# Patient Record
Sex: Female | Born: 1950 | Race: White | Hispanic: No | Marital: Married | Smoking: Never smoker
Health system: Southern US, Community
[De-identification: ages and names within clinical notes are randomized; demographics above are authoritative.]

## PROBLEM LIST (undated history)

## (undated) DIAGNOSIS — K219 Gastro-esophageal reflux disease without esophagitis: Secondary | ICD-10-CM

## (undated) DIAGNOSIS — T7840XA Allergy, unspecified, initial encounter: Secondary | ICD-10-CM

## (undated) DIAGNOSIS — A6000 Herpesviral infection of urogenital system, unspecified: Secondary | ICD-10-CM

## (undated) DIAGNOSIS — M810 Age-related osteoporosis without current pathological fracture: Secondary | ICD-10-CM

## (undated) DIAGNOSIS — F419 Anxiety disorder, unspecified: Secondary | ICD-10-CM

## (undated) DIAGNOSIS — F909 Attention-deficit hyperactivity disorder, unspecified type: Secondary | ICD-10-CM

## (undated) DIAGNOSIS — Z974 Presence of external hearing-aid: Secondary | ICD-10-CM

## (undated) DIAGNOSIS — I719 Aortic aneurysm of unspecified site, without rupture: Secondary | ICD-10-CM

## (undated) DIAGNOSIS — F32A Depression, unspecified: Secondary | ICD-10-CM

## (undated) DIAGNOSIS — J45909 Unspecified asthma, uncomplicated: Secondary | ICD-10-CM

## (undated) DIAGNOSIS — E785 Hyperlipidemia, unspecified: Secondary | ICD-10-CM

## (undated) DIAGNOSIS — E039 Hypothyroidism, unspecified: Secondary | ICD-10-CM

## (undated) DIAGNOSIS — I712 Thoracic aortic aneurysm, without rupture, unspecified: Secondary | ICD-10-CM

## (undated) HISTORY — DX: Anxiety disorder, unspecified: F41.9

## (undated) HISTORY — DX: Gastro-esophageal reflux disease without esophagitis: K21.9

## (undated) HISTORY — DX: Attention-deficit hyperactivity disorder, unspecified type: F90.9

## (undated) HISTORY — DX: Depression, unspecified: F32.A

## (undated) HISTORY — DX: Thoracic aortic aneurysm, without rupture, unspecified: I71.20

## (undated) HISTORY — PX: TONSILLECTOMY AND ADENOIDECTOMY: SUR1326

## (undated) HISTORY — PX: ROBOTIC ASSISTED TOTAL HYSTERECTOMY WITH BILATERAL SALPINGO OOPHERECTOMY: SHX6086

## (undated) HISTORY — DX: Allergy, unspecified, initial encounter: T78.40XA

## (undated) HISTORY — DX: Aortic aneurysm of unspecified site, without rupture: I71.9

## (undated) HISTORY — PX: WRIST FRACTURE SURGERY: SHX121

## (undated) HISTORY — DX: Hyperlipidemia, unspecified: E78.5

## (undated) HISTORY — DX: Unspecified asthma, uncomplicated: J45.909

## (undated) HISTORY — DX: Herpesviral infection of urogenital system, unspecified: A60.00

## (undated) HISTORY — DX: Hypothyroidism, unspecified: E03.9

## (undated) HISTORY — DX: Presence of external hearing-aid: Z97.4

## (undated) HISTORY — PX: ABDOMINAL HYSTERECTOMY: SHX81

## (undated) HISTORY — DX: Thoracic aortic aneurysm, without rupture: I71.2

## (undated) HISTORY — DX: Age-related osteoporosis without current pathological fracture: M81.0

## (undated) HISTORY — PX: ESOPHAGOGASTRODUODENOSCOPY ENDOSCOPY: SHX5814

---

## 2016-08-26 LAB — HM COLONOSCOPY

## 2017-03-01 HISTORY — PX: COLONOSCOPY: SHX174

## 2018-03-01 DIAGNOSIS — C541 Malignant neoplasm of endometrium: Secondary | ICD-10-CM

## 2018-03-01 HISTORY — DX: Malignant neoplasm of endometrium: C54.1

## 2019-05-28 LAB — HM MAMMOGRAPHY

## 2019-07-02 ENCOUNTER — Other Ambulatory Visit: Payer: Self-pay | Admitting: Obstetrics and Gynecology

## 2019-07-02 ENCOUNTER — Other Ambulatory Visit (HOSPITAL_COMMUNITY): Payer: Self-pay | Admitting: Obstetrics and Gynecology

## 2019-07-02 DIAGNOSIS — R918 Other nonspecific abnormal finding of lung field: Secondary | ICD-10-CM

## 2019-07-02 DIAGNOSIS — C55 Malignant neoplasm of uterus, part unspecified: Secondary | ICD-10-CM

## 2019-07-25 ENCOUNTER — Other Ambulatory Visit: Payer: Self-pay

## 2019-07-25 ENCOUNTER — Ambulatory Visit (HOSPITAL_COMMUNITY)
Admission: RE | Admit: 2019-07-25 | Discharge: 2019-07-25 | Disposition: A | Discharge status: Home / Self Care | Payer: Medicare Other | Source: Ambulatory Visit | Attending: Obstetrics and Gynecology | Admitting: Obstetrics and Gynecology

## 2019-07-25 DIAGNOSIS — R918 Other nonspecific abnormal finding of lung field: Secondary | ICD-10-CM | POA: Insufficient documentation

## 2019-07-25 DIAGNOSIS — C55 Malignant neoplasm of uterus, part unspecified: Secondary | ICD-10-CM | POA: Insufficient documentation

## 2019-07-25 LAB — POCT I-STAT CREATININE: Creatinine, Ser: 1.1 mg/dL — ABNORMAL HIGH (ref 0.44–1.00)

## 2019-07-25 MED ORDER — IOHEXOL 300 MG/ML  SOLN
100.0000 mL | Freq: Once | INTRAMUSCULAR | Status: AC | PRN
  Administered 2019-07-25: 100 mL via INTRAVENOUS

## 2019-10-04 ENCOUNTER — Ambulatory Visit: Payer: Medicare Other | Admitting: Medical

## 2019-10-18 ENCOUNTER — Encounter: Payer: Self-pay | Admitting: Medical

## 2019-10-18 ENCOUNTER — Other Ambulatory Visit: Payer: Self-pay

## 2019-10-18 ENCOUNTER — Ambulatory Visit (INDEPENDENT_AMBULATORY_CARE_PROVIDER_SITE_OTHER): Payer: Medicare Other | Admitting: Medical

## 2019-10-18 VITALS — BP 102/78 | HR 81 | Ht 59.0 in | Wt 146.6 lb

## 2019-10-18 DIAGNOSIS — R232 Flushing: Secondary | ICD-10-CM

## 2019-10-18 DIAGNOSIS — R233 Spontaneous ecchymoses: Secondary | ICD-10-CM | POA: Insufficient documentation

## 2019-10-18 DIAGNOSIS — I712 Thoracic aortic aneurysm, without rupture, unspecified: Secondary | ICD-10-CM

## 2019-10-18 DIAGNOSIS — J4520 Mild intermittent asthma, uncomplicated: Secondary | ICD-10-CM

## 2019-10-18 DIAGNOSIS — E039 Hypothyroidism, unspecified: Secondary | ICD-10-CM | POA: Insufficient documentation

## 2019-10-18 DIAGNOSIS — R7989 Other specified abnormal findings of blood chemistry: Secondary | ICD-10-CM

## 2019-10-18 DIAGNOSIS — R61 Generalized hyperhidrosis: Secondary | ICD-10-CM

## 2019-10-18 DIAGNOSIS — R238 Other skin changes: Secondary | ICD-10-CM

## 2019-10-18 DIAGNOSIS — Z8542 Personal history of malignant neoplasm of other parts of uterus: Secondary | ICD-10-CM

## 2019-10-18 DIAGNOSIS — Z90710 Acquired absence of both cervix and uterus: Secondary | ICD-10-CM

## 2019-10-18 NOTE — Progress Notes (Signed)
Subjective: Chief Complaint  Patient presents with  . New Patient (Initial Visit)    thyroid isssues-meds need to be adjusted    Here as a new patient to establish care  Medical team: Dr. Karl Luke, medical Saundra Shelling, gynecology oncology Will be seeing vascular at [place] soon for newly discovered thoracic aneurysm  Moved here 3 months ago.  Retired 2015, moved to [place] area.  Moved back here to be closer to friends and family.  She notes being on thyroid medication.   Had hysterectomy a year ago, endometrial cancer, has had treatment, follow up.   Has been having a lot of hot flashes, thinks her thyroid is off.  Wants lab checked.  She does enjoy spicy foods, drinks about 2 cups of coffee daily.  recently was found to have Aortic Aneurysm.  Has an appointment to see [facility] cardiothoracic for discussion of the new finding  She is active, exercising, just joined [employer] here in [place].  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  She was on statin in the past.  In the past her HDL has always been high and LDL slightly elevated.  She notes a history of easily bruising  No other new complaint     Past Medical History:  Diagnosis Date  . Asthma    mild, but prior on preventative FLovent  . Endometrial cancer ([place]) 2020  . Genital herpes   . GERD (gastroesophageal reflux disease)   . Hypothyroidism   . Thoracic aortic aneurysm [facility])    Current Outpatient Medications on File Prior to Visit  Medication Sig Dispense Refill  . albuterol (VENTOLIN HFA) 108 (90 Base) MCG/ACT inhaler Inhale into the lungs.    Marland Kitchen amphetamine-dextroamphetamine (ADDERALL) 10 MG tablet Take 10 mg by mouth daily with breakfast.    . azelastine (ASTELIN) 0.1 % nasal spray     . buPROPion (WELLBUTRIN XL) 150 MG 24 hr tablet      . escitalopram (LEXAPRO) 10 MG tablet     . levothyroxine (SYNTHROID) 137 MCG tablet     . pantoprazole (PROTONIX) 40 MG tablet Take 40 mg by mouth daily.    . valACYclovir (VALTREX) 500 MG tablet Take 500 mg by mouth as needed.     No current facility-administered medications on file prior to visit.   Family History  Problem Relation Age of Onset  . COPD Mother   . Heart failure Mother   . Macular degeneration Mother   . Osteoporosis Mother   . Heart disease Father        MI  . COPD Father   . Aortic aneurysm Father   . Heart disease Brother        MI  . Myelodysplastic syndrome Brother   . Osteoporosis Maternal Grandmother   . Heart failure Maternal Grandmother   . Heart failure Maternal Grandfather   . COPD Maternal Grandfather   . Stroke Paternal Grandfather     ROS as in subjective     Objective: BP 102/78   Pulse 81   Ht 4\' 11"  (1.499 m)   Wt 146 lb 9.6 oz (66.5 kg)   SpO2 95%   BMI 29.61 kg/m   General appearence: alert, no distress, WD/WN, white female Neck: supple, no lymphadenopathy, no thyromegaly, no masses, no bruits Heart:  Faint 2 out of 6 murmur heard in upper sternal borders, otherwise RRR, normal S1, S2 Lungs: CTA bilaterally, no wheezes, rhonchi, or rales Abdomen: +bs, soft, non tender, non distended, no masses, no hepatomegaly, no splenomegaly Pulses: 2+ symmetric, upper and lower extremities, normal cap refill Extremities: No edema no cyanosis or clubbing    Assessment: Encounter Diagnoses  Name Primary?  Marland Kitchen Hot flashes Yes  . Abnormal thyroid blood test   . History of endometrial cancer   . Mild intermittent asthma without complication   . Thoracic aortic aneurysm without rupture ([place])   . S/P hysterectomy   . Bruises easily   . Excessive sweating   . Hypothyroidism, unspecified type     Plan: Hot flashes, abnormal thyroid blood test, status post hysterectomy due to endometrial cancer.  She likely is having hot flashes related to  surgical menopause but we will check labs today  Hypothyroidism-discussed proper use of medicine, continue current medicine, labs today  History of endometrial cancer-we will refer to gynecology here locally to get established  Asthma-continue albuterol as needed, may need to add back preventative inhaler in spring and fall  New thoracic aortic aneurysm-she has appointment scheduled with vascular to establish care already, [employer] easily-labs today  Family history of heart disease-we discussed possibly going back on aspirin statin for preventative measures  We will document her Covid vaccine.  She brought in a copy of her card  Brittnei was seen today for new patient (initial visit).  Diagnoses and all orders for this visit:  Hot flashes -     TSH -     T4, free -     CBC with Differential/Platelet -     Comprehensive metabolic panel -     Lactate dehydrogenase  Abnormal thyroid blood test -     TSH -     T4, free  History of endometrial cancer -     CBC with Differential/Platelet -     Comprehensive metabolic panel -     Lactate dehydrogenase  Mild intermittent asthma without complication  Thoracic aortic aneurysm without rupture (HCC)  S/P hysterectomy  Bruises easily -     CBC with Differential/Platelet  Excessive sweating  Hypothyroidism, unspecified type   F/u pending labs

## 2019-10-19 ENCOUNTER — Other Ambulatory Visit: Payer: Self-pay

## 2019-10-19 ENCOUNTER — Other Ambulatory Visit: Payer: Self-pay | Admitting: Medical

## 2019-10-19 LAB — COMPREHENSIVE METABOLIC PANEL
ALT: 24 IU/L (ref 0–32)
AST: 26 IU/L (ref 0–40)
Albumin/Globulin Ratio: 1.6 (ref 1.2–2.2)
Albumin: 4.3 g/dL (ref 3.8–4.8)
Alkaline Phosphatase: 116 IU/L (ref 48–121)
BUN/Creatinine Ratio: 25 (ref 12–28)
BUN: 23 mg/dL (ref 8–27)
Bilirubin Total: 0.4 mg/dL (ref 0.0–1.2)
CO2: 23 mmol/L (ref 20–29)
Calcium: 9.6 mg/dL (ref 8.7–10.3)
Chloride: 102 mmol/L (ref 96–106)
Creatinine, Ser: 0.91 mg/dL (ref 0.57–1.00)
GFR calc Af Amer: 76 mL/min/{1.73_m2} (ref >59)
GFR calc non Af Amer: 65 mL/min/{1.73_m2} (ref >59)
Globulin, Total: 2.7 g/dL (ref 1.5–4.5)
Glucose: 97 mg/dL (ref 65–99)
Potassium: 5.1 mmol/L (ref 3.5–5.2)
Sodium: 138 mmol/L (ref 134–144)
Total Protein: 7 g/dL (ref 6.0–8.5)

## 2019-10-19 LAB — CBC WITH DIFFERENTIAL/PLATELET
Basophils Absolute: 0.1 10*3/uL (ref 0.0–0.2)
Basos: 1 %
EOS (ABSOLUTE): 0.2 10*3/uL (ref 0.0–0.4)
Eos: 3 %
Hematocrit: 43.1 % (ref 34.0–46.6)
Hemoglobin: 14.5 g/dL (ref 11.1–15.9)
Immature Grans (Abs): 0 10*3/uL (ref 0.0–0.1)
Immature Granulocytes: 1 %
Lymphocytes Absolute: 1.6 10*3/uL (ref 0.7–3.1)
Lymphs: 20 %
MCH: 31.6 pg (ref 26.6–33.0)
MCHC: 33.6 g/dL (ref 31.5–35.7)
MCV: 94 fL (ref 79–97)
Monocytes Absolute: 0.7 10*3/uL (ref 0.1–0.9)
Monocytes: 9 %
Neutrophils Absolute: 5.7 10*3/uL (ref 1.4–7.0)
Neutrophils: 66 %
Platelets: 228 10*3/uL (ref 150–450)
RBC: 4.59 x10E6/uL (ref 3.77–5.28)
RDW: 13 % (ref 11.7–15.4)
WBC: 8.4 10*3/uL (ref 3.4–10.8)

## 2019-10-19 LAB — T4, FREE: Free T4: 1.6 ng/dL (ref 0.82–1.77)

## 2019-10-19 LAB — TSH: TSH: 0.065 u[IU]/mL — ABNORMAL LOW (ref 0.450–4.500)

## 2019-10-19 LAB — LACTATE DEHYDROGENASE: LDH: 337 IU/L — ABNORMAL HIGH (ref 119–226)

## 2019-10-19 MED ORDER — SYNTHROID 125 MCG PO TABS: 125.0000 ug | ORAL_TABLET | Freq: Every day | ORAL | 0 refills | Status: DC

## 2019-10-24 ENCOUNTER — Other Ambulatory Visit: Payer: Self-pay

## 2019-10-24 DIAGNOSIS — R7989 Other specified abnormal findings of blood chemistry: Secondary | ICD-10-CM

## 2019-10-24 DIAGNOSIS — R232 Flushing: Secondary | ICD-10-CM

## 2019-10-25 ENCOUNTER — Telehealth: Payer: Self-pay | Admitting: *Deleted

## 2019-10-25 NOTE — Telephone Encounter (Signed)
Called and scheduled the patient for a new patient appt on 12/13 at 11:15 am. Gave the address and phone number of the clinic. Also gave the policy for parking, mask and visitors.

## 2019-10-29 ENCOUNTER — Encounter: Payer: Self-pay | Admitting: Medical

## 2019-11-09 ENCOUNTER — Other Ambulatory Visit: Payer: Self-pay | Admitting: Medical

## 2019-11-09 ENCOUNTER — Telehealth: Payer: Self-pay | Admitting: Medical

## 2019-11-09 MED ORDER — AMPHETAMINE-DEXTROAMPHETAMINE 10 MG PO TABS: 10.0000 mg | ORAL_TABLET | Freq: Every day | ORAL | 0 refills | Status: DC

## 2019-11-09 NOTE — Telephone Encounter (Signed)
Pt called and states that she needs a refill on her adderall pt needs it to go the [place], [place]

## 2019-11-09 NOTE — Telephone Encounter (Signed)
Done  Schedule 3 mo f/u

## 2020-01-03 ENCOUNTER — Other Ambulatory Visit: Payer: Self-pay | Admitting: Medical

## 2020-01-03 NOTE — Telephone Encounter (Signed)
Giving enough til appointment next month.

## 2020-01-08 ENCOUNTER — Telehealth (INDEPENDENT_AMBULATORY_CARE_PROVIDER_SITE_OTHER): Payer: Medicare Other | Admitting: Medical

## 2020-01-08 ENCOUNTER — Other Ambulatory Visit: Payer: Self-pay

## 2020-01-08 ENCOUNTER — Encounter: Payer: Self-pay | Admitting: Medical

## 2020-01-08 VITALS — Temp 98.6°F | Wt 145.0 lb

## 2020-01-08 DIAGNOSIS — R197 Diarrhea, unspecified: Secondary | ICD-10-CM | POA: Diagnosis not present

## 2020-01-08 DIAGNOSIS — A084 Viral intestinal infection, unspecified: Secondary | ICD-10-CM | POA: Diagnosis not present

## 2020-01-08 DIAGNOSIS — R1084 Generalized abdominal pain: Secondary | ICD-10-CM | POA: Insufficient documentation

## 2020-01-08 NOTE — Progress Notes (Signed)
Subjective:     Patient ID: Jamie Singh, female   DOB: Nov 19, 1950, 69 y.o.   MRN: [id number]  This visit type was conducted due to national recommendations for restrictions regarding the COVID-19 Pandemic (e.g. social distancing) in an effort to limit this patient's exposure and mitigate transmission in our community.  Due to their co-morbid illnesses, this patient is at least at moderate risk for complications without adequate follow up.  This format is felt to be most appropriate for this patient at this time.    Documentation for virtual audio and video telecommunications through [facility] [place] encounter:  The patient was located at home. The provider was located in the office. The patient did consent to this visit and is aware of possible charges through their insurance for this visit.  The other persons participating in this telemedicine service were none. Time spent on call was 20 minutes and in review of previous records 20 minutes total.  This virtual service is not related to other E/M service within previous 7 days.   HPI Chief Complaint  Patient presents with  . other    abdominal pain and diarrhea started Friday afternoon nausea    Consult today regarding abdominal pain, diarrhea.  Started 4.5 days ago.   initially had 5-6 loose stools.   Has used some imodium with some help.   No fever.  Had negative covid test 2 days into symptoms.  Initially had crampy pains.  Has urge to go to the bathroom.  More often he gets constipated, so at first didn't think much of having to go with some loose stools, but symptoms worsened.  Saturday night spent most of the night in the bathroom.   Monday initially started ok, semi normal stool, but continued to have nausea and then diarrhea started back up again.  Yesterday continued with 5-6 loose stools just at night.  This morning a little loose.  Has had some ongoing nausea, but no vomiting, no fever.   appetite has been down, but is eating.  Is  doing well with fluid intake.   No blood in stool.   No sick contacts with similar symptoms.  Currently has no pain in abdomen.  Last night did have some cramps.   She did travel to [place] 3 weeks ago.  No consumption of questionable water.   No significant concern for food poisoning.  Had smoked salmon this past Friday the day symptoms began, but was already having loose stool before eating this.    Denies rash.  No new URI symptoms, but always has some allergic rhinitis/runny nose /sniffles.  This is not new.    No body aches or chills.   No recent antibiotics.  Husband had a little similar symptoms 1.5 week ago.  However, he is improved now.  No prior diverticulitis.   Past Medical History:  Diagnosis Date  . Asthma    mild, but prior on preventative FLovent  . Endometrial cancer ([place]) 2020  . Genital herpes   . GERD (gastroesophageal reflux disease)   . Hypothyroidism   . Thoracic aortic aneurysm (HCC)     Review of Systems As in subjective    Objective:   Physical Exam Due to coronavirus pandemic stay at home measures, patient visit was virtual and they were not examined in person.   Temp 98.6 F (37 C)   Wt 145 lb (65.8 kg)   BMI 29.29 kg/m        Assessment:     Encounter Diagnoses  Name Primary?  . Generalized abdominal pain Yes  . Diarrhea, unspecified type   . Viral gastroenteritis         Plan:     Discussed symptoms, concerns.   Discussed limitations of virtual consult.  symptoms suggest viral gastroenterologist.  advised continued rest, hydration, avoid dehydration, use imodium sparingly as long as no blood in stool or fever.   Should gradually improve over the next few days.  If worse, no improving, or new symptoms  in the next 72 hours, then recheck in person.   [name] was seen today for other.  Diagnoses and all orders for this visit:  Generalized abdominal pain  Diarrhea, unspecified type  Viral gastroenteritis

## 2020-02-04 ENCOUNTER — Telehealth: Payer: Self-pay | Admitting: Medical

## 2020-02-04 NOTE — Telephone Encounter (Signed)
Yes, [facility], please fax records.  Let her know

## 2020-02-05 NOTE — Telephone Encounter (Signed)
Records faxed.

## 2020-02-10 NOTE — Progress Notes (Signed)
GYNECOLOGIC ONCOLOGY NEW PATIENT CONSULTATION   Patient Name: Jamie Singh  Patient Age: 69 y.o. Date of Service: 02/11/2020 Referring Provider: Carlena Hurl, PA-C [address] [place],  [place] [address]   Primary Care Provider: Carlena Hurl, PA-C Consulting Provider: Jeral Pinch, MD   Assessment/Plan:  Postmenopausal patient who was diagnosed with early stage grade 3 uterine cancer approximately 18 months ago who has recently transferred care from [facility], presents today to establish surveillance care.  The patient is overall doing well without evidence of disease on exam today.  She has had some agglutination of her vaginal apex, which was open today manually without difficulty.  She now has ordered some dilators and we discussed natural lubricants to use with these.  She understands the importance of dilator use and preventing agglutination in terms of being able to do an exam and have improved monitoring for disease recurrence.  In terms of plan for continued surveillance, discussed that per NCCN surveillance recommendations, we can continue with visits every 3-6 months until she is 2-3 years out from completing treatment.  Then we would transition to visits every 6 months until she is 5 years out.  She would like to plan for visit in 3 months and then we will discuss again possibly spacing out visits to every 6 months.  We reviewed signs and symptoms that would be concerning for disease recurrence, and she understands the importance of calling if she develops any of these before her next scheduled visit.  A copy of this note was sent to the patient's referring provider.   50 minutes of total time was spent for this patient encounter, including preparation, face-to-face counseling with the patient and coordination of care, and documentation of the encounter.  Jeral Pinch, MD  Division of Gynecologic Oncology  Department of Obstetrics and Gynecology   [facility]  ___________________________________________  Chief Complaint: Chief Complaint  Patient presents with   History of endometrial cancer    Surveillance Visit    History of Present Illness:  Jamie Singh is a 69 y.o. y.o. female who is seen in consultation at the request of Tysinger, Camelia Eng, PA-C for an evaluation of a history of uterine cancer, presenting to establish care for surveillance after moving from [place].  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  On 08/18/2018 she underwent robotic assisted total laparoscopic hysterectomy, bilateral salpingo-oophorectomy, bilateral sentinel lymph node dissection, and collection of pelvic washings.  Pathology revealed a tumor size of 4.5 cm, myometrial invasion 42% (8 of 19 mm), no LVSI.  Was performed at [facility] with Dr. Fleet Contras.  Patient then received adjuvant vaginal brachytherapy (vaginal cuff HDR, 4 fractions of 5.5 Gray, treated from 8/11-8/21/2020).  The patient was last seen for surveillance visit in [place] with Dr. Jake Bathe on 10/11.  At that time she underwent Pap test that was negative.  Patient moved from 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  She lives in [place] with her husband.  She is getting ready to travel to [facility] later this week for second opinion on her aortic aneurysm.  She denies any vaginal bleeding or discharge with the exception of some spotting with recent vaginal dilator use.  Patient lost her vaginal dilator during the move from [place].  She now has some vaginal agglutination.  She denies any abdominal or pelvic pain.  She endorses a good appetite without nausea or emesis.  She reports normal bowel function.  She notes having to exert increased effort to urinate, notably to empty her bladder.   PAST MEDICAL HISTORY:  Past  Medical History:  Diagnosis Date   Asthma    mild, but prior on preventative FLovent   Endometrial cancer ([place]) 2020   Genital herpes    GERD (gastroesophageal reflux disease)    Hypothyroidism    Thoracic aortic aneurysm ([place])      PAST SURGICAL HISTORY:  Past Surgical History:  Procedure Laterality Date   COLONOSCOPY  2019   ROBOTIC ASSISTED TOTAL HYSTERECTOMY WITH BILATERAL SALPINGO OOPHERECTOMY     [id number]   TONSILLECTOMY AND ADENOIDECTOMY      OB/GYN HISTORY:  OB History  Gravida Para Term Preterm AB Living  2 0          SAB IAB Ectopic Multiple Live Births               # Outcome Date GA Lbr Len/2nd Weight Sex Delivery Anes PTL Lv  2 Gravida           1 Gravida             No LMP recorded. Patient has had a hysterectomy.  Age at menarche: 15-13 Age at menopause: 75 Hx of HRT: Yes, took low-dose hormone replacement for 4-5 years Last pap: 11/2019 - negative, HR HPV negative History of abnormal pap smears: Unsure, thinks she had one around the time of her cancer diagnosis  SCREENING STUDIES:  Last mammogram: 04/2019  Last colonoscopy: 07/2016 Last bone mineral density: Thinks that it has been a while  MEDICATIONS: Outpatient Encounter Medications as of 02/11/2020  Medication Sig   albuterol (VENTOLIN HFA) 108 (90 Base) MCG/ACT inhaler Inhale into the lungs.   amphetamine-dextroamphetamine (ADDERALL) 10 MG tablet Take 1 tablet (10 mg total) by mouth daily with breakfast.   azelastine (ASTELIN) 0.1 % nasal spray    buPROPion (WELLBUTRIN XL) 150 MG 24 hr tablet    escitalopram (LEXAPRO) 10 MG tablet    pantoprazole (PROTONIX) 40 MG tablet Take 40 mg by mouth daily.   rosuvastatin (CRESTOR) 10 MG tablet Take 10 mg by mouth daily.   SYNTHROID 125 MCG tablet TAKE 1 TABLET EVERY DAY   valACYclovir (VALTREX) 500 MG tablet Take 500 mg by mouth as needed.   No facility-administered encounter medications on file as of 02/11/2020.    ALLERGIES:   Allergies  Allergen Reactions   Pollen Extract Hives   Other Other (See Comments)     FAMILY HISTORY:  Family History  Problem Relation Age of Onset   COPD Mother    Heart failure Mother    Macular degeneration Mother    Osteoporosis Mother    Cancer Mother        Mom needed a hysterectomy, thinks that she may have had uterine cancer   Heart disease Father        MI   COPD Father    Aortic aneurysm Father    Heart disease Brother        MI   Myelodysplastic syndrome Brother    Osteoporosis Maternal Grandmother    Heart failure Maternal Grandmother    Heart failure Maternal Grandfather    COPD Maternal Grandfather    Stroke Paternal Grandfather    Breast cancer Neg Hx    Colon cancer Neg Hx      SOCIAL HISTORY:  Social Connections: Not  on file    REVIEW OF SYSTEMS:  Denies appetite changes, fevers, chills, fatigue, unexplained weight changes. Denies hearing loss, neck lumps or masses, mouth sores, ringing in ears or voice changes. Denies cough or wheezing.  Denies shortness of breath. Denies chest pain or palpitations. Denies leg swelling. Denies abdominal distention, pain, blood in stools, constipation, diarrhea, nausea, vomiting, or early satiety. Denies pain with intercourse, dysuria, frequency, hematuria or incontinence. Denies hot flashes, pelvic pain, vaginal bleeding or vaginal discharge.   Denies joint pain, back pain or muscle pain/cramps. Denies itching, rash, or wounds. Denies dizziness, headaches, numbness or seizures. Denies swollen lymph nodes or glands, denies easy bruising or bleeding. Denies anxiety, depression, confusion, or decreased concentration.  Physical Exam:  Vital Signs for this encounter:  Blood pressure 136/78, pulse 70, temperature (!) 97 F (36.1 C), temperature source Tympanic, resp. rate 18, height 5' (1.524 m), weight 152 lb 9.6 oz (69.2 kg), SpO2 99 %. Body mass index is 29.8 kg/m. General: Alert, oriented,  no acute distress.  HEENT: Normocephalic, atraumatic. Sclera anicteric.  Chest: Clear to auscultation bilaterally. No wheezes, rhonchi, or rales. Cardiovascular: Regular rate and rhythm, no murmurs, rubs, or gallops.  Abdomen: Normoactive bowel sounds. Soft, nondistended, nontender to palpation. No masses or hepatosplenomegaly appreciated. No palpable fluid wave.  Well-healed incisions. Extremities: Grossly normal range of motion. Warm, well perfused. No edema bilaterally.  Skin: No rashes or lesions.  Lymphatics: No cervical, supraclavicular, or inguinal adenopathy.  GU:  Normal external female genitalia. No lesions. No discharge or bleeding.             Bladder/urethra:  No lesions or masses, well supported bladder             Vagina: Moderately atrophic, changes consistent with history of radiation.  There is some agglutination at the top of the vagina that is manually fractured without pain or difficulty.  Apex of the vagina is smooth, no lesions or nodularity noted.             Cervix and uterus: Surgically absent.  Rectal: Confirms the above findings, no nodularity noted.  LABORATORY AND RADIOLOGIC DATA:  Outside medical records were reviewed to synthesize the above history, along with the history and physical obtained during the visit.   Lab Results  Component Value Date   WBC 8.4 10/18/2019   HGB 14.5 10/18/2019   HCT 43.1 10/18/2019   PLT 228 10/18/2019   GLUCOSE 97 10/18/2019   ALT 24 10/18/2019   AST 26 10/18/2019   NA 138 10/18/2019   K 5.1 10/18/2019   CL 102 10/18/2019   CREATININE 0.91 10/18/2019   BUN 23 10/18/2019   CO2 23 10/18/2019   TSH 0.065 (L) 10/18/2019

## 2020-02-11 ENCOUNTER — Other Ambulatory Visit: Payer: Self-pay

## 2020-02-11 ENCOUNTER — Inpatient Hospital Stay: Payer: Medicare Other | Attending: Gynecologic Oncology | Admitting: Gynecologic Oncology

## 2020-02-11 ENCOUNTER — Encounter: Payer: Self-pay | Admitting: Gynecologic Oncology

## 2020-02-11 VITALS — BP 136/78 | HR 70 | Temp 97.0°F | Resp 18 | Ht 60.0 in | Wt 152.6 lb

## 2020-02-11 DIAGNOSIS — Z90722 Acquired absence of ovaries, bilateral: Secondary | ICD-10-CM | POA: Insufficient documentation

## 2020-02-11 DIAGNOSIS — Z8542 Personal history of malignant neoplasm of other parts of uterus: Secondary | ICD-10-CM | POA: Insufficient documentation

## 2020-02-11 DIAGNOSIS — K219 Gastro-esophageal reflux disease without esophagitis: Secondary | ICD-10-CM | POA: Insufficient documentation

## 2020-02-11 DIAGNOSIS — Z79899 Other long term (current) drug therapy: Secondary | ICD-10-CM | POA: Diagnosis not present

## 2020-02-11 DIAGNOSIS — E039 Hypothyroidism, unspecified: Secondary | ICD-10-CM | POA: Diagnosis not present

## 2020-02-11 DIAGNOSIS — J45909 Unspecified asthma, uncomplicated: Secondary | ICD-10-CM | POA: Diagnosis not present

## 2020-02-11 DIAGNOSIS — B009 Herpesviral infection, unspecified: Secondary | ICD-10-CM | POA: Insufficient documentation

## 2020-02-11 DIAGNOSIS — Z90710 Acquired absence of both cervix and uterus: Secondary | ICD-10-CM | POA: Diagnosis not present

## 2020-02-11 NOTE — Patient Instructions (Signed)
No evidence of cancer on your exam today! Please call if you develop any new symptoms (bleeding, pelvic pain, weight loss, change to bowel movements) before your next scheduled visit.

## 2020-02-20 ENCOUNTER — Ambulatory Visit (INDEPENDENT_AMBULATORY_CARE_PROVIDER_SITE_OTHER): Payer: Medicare Other | Admitting: Medical

## 2020-02-20 ENCOUNTER — Other Ambulatory Visit: Payer: Self-pay

## 2020-02-20 ENCOUNTER — Telehealth: Payer: Self-pay | Admitting: Medical

## 2020-02-20 ENCOUNTER — Encounter: Payer: Self-pay | Admitting: Medical

## 2020-02-20 VITALS — BP 124/86 | HR 79 | Ht 60.0 in | Wt 149.0 lb

## 2020-02-20 DIAGNOSIS — Z8659 Personal history of other mental and behavioral disorders: Secondary | ICD-10-CM | POA: Insufficient documentation

## 2020-02-20 DIAGNOSIS — Z0000 Encounter for general adult medical examination without abnormal findings: Secondary | ICD-10-CM | POA: Diagnosis not present

## 2020-02-20 DIAGNOSIS — A084 Viral intestinal infection, unspecified: Secondary | ICD-10-CM

## 2020-02-20 DIAGNOSIS — Z780 Asymptomatic menopausal state: Secondary | ICD-10-CM

## 2020-02-20 DIAGNOSIS — Z1159 Encounter for screening for other viral diseases: Secondary | ICD-10-CM | POA: Insufficient documentation

## 2020-02-20 DIAGNOSIS — R238 Other skin changes: Secondary | ICD-10-CM

## 2020-02-20 DIAGNOSIS — I712 Thoracic aortic aneurysm, without rupture, unspecified: Secondary | ICD-10-CM

## 2020-02-20 DIAGNOSIS — J4520 Mild intermittent asthma, uncomplicated: Secondary | ICD-10-CM | POA: Diagnosis not present

## 2020-02-20 DIAGNOSIS — R233 Spontaneous ecchymoses: Secondary | ICD-10-CM

## 2020-02-20 DIAGNOSIS — E039 Hypothyroidism, unspecified: Secondary | ICD-10-CM

## 2020-02-20 DIAGNOSIS — Z7185 Encounter for immunization safety counseling: Secondary | ICD-10-CM

## 2020-02-20 DIAGNOSIS — E2839 Other primary ovarian failure: Secondary | ICD-10-CM

## 2020-02-20 DIAGNOSIS — Z8542 Personal history of malignant neoplasm of other parts of uterus: Secondary | ICD-10-CM

## 2020-02-20 DIAGNOSIS — M8580 Other specified disorders of bone density and structure, unspecified site: Secondary | ICD-10-CM | POA: Insufficient documentation

## 2020-02-20 DIAGNOSIS — Z974 Presence of external hearing-aid: Secondary | ICD-10-CM | POA: Insufficient documentation

## 2020-02-20 DIAGNOSIS — Z90710 Acquired absence of both cervix and uterus: Secondary | ICD-10-CM

## 2020-02-20 DIAGNOSIS — R0683 Snoring: Secondary | ICD-10-CM | POA: Insufficient documentation

## 2020-02-20 DIAGNOSIS — R232 Flushing: Secondary | ICD-10-CM

## 2020-02-20 NOTE — Progress Notes (Signed)
Subjective:    Jamie Singh is a 69 y.o. female who presents for Preventative Services visit and chronic medical problems/med check visit.    Primary Care Provider Akiya Morr, Kermit Balo, PA-C here for primary care  Current Health Care Team:  Dentist, Dr. Harrison Mons doctor, n/a   Dr. Johnnette Barrios, medical Addison Lank, gynecology oncology  Seeing vascular at [facility] recently established care for newly discovered thoracic aneurysm.  Also had recent second opinion at [facility].     Medical Services you may have received from other than [facility] providers in the past year (date may be approximate) Recent cardiothoracic surgery consults for hx/o thoracic aortic aneurysm  Exercise Current exercise habits: Gym/ health club routine includes boot cam with different exercises 3 days aa week for 45 minutes.   Nutrition/Diet Current diet: well balanced  Depression Screen Depression screen [place] [facility] 2/9 02/20/2020  Decreased Interest 0  Down, Depressed, Hopeless 2  PHQ - 2 Score 2  Altered sleeping 1  Tired, decreased energy 0  Change in appetite 3  Feeling bad or failure about yourself  1  Trouble concentrating 0  Moving slowly or fidgety/restless 0  Suicidal thoughts 0  PHQ-9 Score 7  Difficult doing work/chores Very difficult    Activities of Daily Living Screen/Functional Status Survey Is the patient deaf or have difficulty hearing?: Yes (hearing aid) Does the patient have difficulty seeing, even when wearing glasses/contacts?: No Does the patient have difficulty concentrating, remembering, or making decisions?: No Does the patient have difficulty walking or climbing stairs?: No Does the patient have difficulty dressing or bathing?: No Does the patient have difficulty doing errands alone such as visiting a doctor's office or shopping?: No  Can patient draw a clock face showing 11:00 oclock, yes    Fall Risk Screen Fall Risk  02/20/2020 10/18/2019  Falls in the past year? 0 0  Risk for  fall due to : No Fall Risks -  Follow up Falls evaluation completed -     Gait Assessment: Normal gait observed yes  Advanced directives Does patient have a Health Care Power of Attorney? Yes Does patient have a Living Will? Yes  Past Medical History:  Diagnosis Date  . Asthma    mild, but prior on preventative FLovent  . Endometrial cancer (HCC) 2020  . Genital herpes   . GERD (gastroesophageal reflux disease)   . Hypothyroidism   . Thoracic aortic aneurysm (HCC)   . Wears hearing aid     Past Surgical History:  Procedure Laterality Date  . COLONOSCOPY  2019  . ROBOTIC ASSISTED TOTAL HYSTERECTOMY WITH BILATERAL SALPINGO OOPHERECTOMY     RA-TLH/BSO  . TONSILLECTOMY AND ADENOIDECTOMY      Social History   Socioeconomic History  . Marital status: Married    Spouse name: Not on file  . Number of children: Not on file  . Years of education: Not on file  . Highest education level: Not on file  Occupational History  . Occupation: Hydrographic surveyor - retired  Tobacco Use  . Smoking status: Never Smoker  . Smokeless tobacco: Never Used  Substance and Sexual Activity  . Alcohol use: Yes    Alcohol/week: 6.0 standard drinks    Types: 2 Glasses of wine, 2 Cans of beer, 2 Shots of liquor per week  . Drug use: Yes    Types: Marijuana    Comment: gummies  . Sexual activity: Not Currently  Other Topics Concern  . Not on file  Social History  Narrative   Lives with husband and dog.  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   Social Determinants of Health   Financial Resource Strain: Not on file  Food Insecurity: Not on file  Transportation Needs: Not on file  Physical Activity: Not on file  Stress: Not on file  Social Connections: Not on file  Intimate Partner Violence: Not on file    Family History  Problem Relation Age of Onset  . COPD Mother   . Heart failure Mother   . Macular degeneration Mother   . Osteoporosis Mother   . Cancer Mother        Mom needed a  hysterectomy, thinks that she may have had uterine cancer  . Heart disease Father        MI  . COPD Father   . Aortic aneurysm Father   . Heart disease Brother        MI  . Myelodysplastic syndrome Brother   . Osteoporosis Maternal Grandmother   . Heart failure Maternal Grandmother   . Heart failure Maternal Grandfather   . COPD Maternal Grandfather   . Stroke Paternal Grandfather   . Breast cancer Neg Hx   . Colon cancer Neg Hx      Current Outpatient Medications:  .  albuterol (VENTOLIN HFA) 108 (90 Base) MCG/ACT inhaler, Inhale into the lungs., Disp: , Rfl:  .  amphetamine-dextroamphetamine (ADDERALL) 10 MG tablet, Take 1 tablet (10 mg total) by mouth daily with breakfast., Disp: 90 tablet, Rfl: 0 .  azelastine (ASTELIN) 0.1 % nasal spray, , Disp: , Rfl:  .  buPROPion (WELLBUTRIN XL) 150 MG 24 hr tablet, , Disp: , Rfl:  .  escitalopram (LEXAPRO) 10 MG tablet, , Disp: , Rfl:  .  Multiple Vitamins-Minerals (MULTI COMPLETE/IRON) TABS, Take by mouth., Disp: , Rfl:  .  pantoprazole (PROTONIX) 40 MG tablet, Take 40 mg by mouth daily., Disp: , Rfl:  .  rosuvastatin (CRESTOR) 10 MG tablet, Take 10 mg by mouth daily., Disp: , Rfl:  .  SYNTHROID 125 MCG tablet, TAKE 1 TABLET EVERY DAY, Disp: 60 tablet, Rfl: 0 .  valACYclovir (VALTREX) 500 MG tablet, Take 500 mg by mouth as needed., Disp: , Rfl:   Allergies  Allergen Reactions  . Pollen Extract Hives  . Other Other (See Comments)    History reviewed: allergies, current medications, past family history, past medical history, past social history, past surgical history and problem list  Chronic issues discussed: Allergies - takes claritin daily.   Does get fair amount of post nasal drainage .uses both Astelin and flonase, in general, not necessary both every day.  She attributes the snoring to the drainage.  Fasting for labs.  Is hangry today, ready for labs  hyperlipidemia - compliant with Crestor daily  hypothyroidism - compliant  with synthroid 179mcg daily  GERD - uses Protonix regularly, occasional Tums  Depression, ADD - taking Lexapro.  Although 10mg  listed, is using 5mg  daily.  Would like to get off this.  Had tried to get off this 2015, but election came creating a lot of stress.  Still taking Wellbutrin 150mg  XL daily, but hasn't taken adder al 10mg  in several weeks.     Acute issues discussed: Lately snoring a lot.  According to her partner, snoring a lot.   No prior diagnosis of sleep apnea.    Curious about coronary calcium test  Needs referral to shrink.   Has gained a lot of weight.  Has hx/o bulmia.  She has a bad problem with stress eating.   This is her unhealthy coping mechanism  Has gained about 20 lb in the past 1.5 year   Objective:      Biometrics BP 124/86   Pulse 79   Ht 5' (1.524 m)   Wt 149 lb (67.6 kg)   SpO2 96%   BMI 29.10 kg/m   Cognitive Testing  Alert? Yes  Normal Appearance?Yes  Oriented to person? Yes  Place? Yes   Time? Yes  Recall of three objects?  Yes  Can perform simple calculations? Yes  Displays appropriate judgment?Yes  Can read the correct time from a watch face?Yes  General appearance: alert, no distress, WD/WN, white female  Nutritional Status: Inadequate calore intake? no Loss of muscle mass? no Loss of fat beneath skin? no Localized or general edema? no Diminished functional status? no  Other pertinent exam: HENT/oral - limited exam due to covid precautions Neck: supple, no lymphadenopathy, no thyromegaly, no masses, no bruits Heart: RRR, normal S1, S2, no murmurs Lungs: CTA bilaterally, no wheezes, rhonchi, or rales Abdomen: +bs, soft, non tender, non distended, no masses, no hepatomegaly, no splenomegaly Musculoskeletal: nontender, no swelling, no obvious deformity Extremities: no edema, no cyanosis, no clubbing Pulses: 2+ symmetric, upper and lower extremities, normal cap refill Neurological: alert, oriented x 3, CN2-12 intact, strength  normal upper extremities and lower extremities, sensation normal throughout, DTRs 2+ throughout, no cerebellar signs, gait normal Psychiatric: normal affect, behavior normal, pleasant  Gyn/breast/rectal - deferred to gynecology   Assessment:   Encounter Diagnoses  Name Primary?  . Hypothyroidism, unspecified type Yes  . Initial Medicare annual wellness visit   . Encounter for health maintenance examination in adult   . Thoracic aortic aneurysm without rupture (HCC)   . Hot flashes   . Mild intermittent asthma without complication   . Viral gastroenteritis   . S/P hysterectomy   . History of endometrial cancer   . Bruises easily   . Uses hearing aid   . Encounter for hepatitis C screening test for low risk patient   . Vaccine counseling   . Post-menopausal   . Estrogen deficiency   . Osteopenia, unspecified location   . Snoring      Plan:   A preventative services visit was completed today.  During the course of the visit today, we discussed and counseled about appropriate screening and preventive services.  A health risk assessment was established today that included a review of current medications, allergies, social history, family history, medical and preventative health history, biometrics, and preventative screenings to identify potential safety concerns or impairments.  A personalized plan was printed today for your records and use.   Personalized health advice and education was given today to reduce health risks and promote self management and wellness.  Information regarding end of life planning was discussed today.  Today you had a preventative care visit or wellness visit.    Topics today may have included healthy lifestyle, diet, exercise, preventative care, vaccinations, sick and well care, proper use of emergency dept and after hours care, as well as other concerns.     Recommendations: Continue to return yearly for your annual wellness and preventative care visits.   This gives [place] a chance to discuss healthy lifestyle, exercise, vaccinations, review your chart record, and perform screenings where appropriate.  I recommend you see your eye doctor yearly for routine vision care.  I recommend you see your dentist yearly for routine dental care  including hygiene visits twice yearly.  C/t routine follow up with gynecology and specialist   Vaccination recommendations were reviewed  Up to date on flu and covid vaccines . Will request prior vaccines from [facility].  She notes prior pneumococcal and Shingrix vaccines within last few years   Screening for cancer: Breast cancer screening: You should perform a self breast exam monthly.   We reviewed recommendations for regular mammograms and breast cancer screening.  Colon cancer screening:  Request prior colonoscopy that she notes is up to date  Cervical cancer screening, hx/o endometrial cancer, s/p hysterectomy, followed by gynecology  Skin cancer screening: Check your skin regularly for new changes, growing lesions, or other lesions of concern Come in for evaluation if you have skin lesions of concern.  Lung cancer screening: If you have a greater than 30 pack year history of tobacco use, then you qualify for lung cancer screening with a chest CT scan  We currently don't have screenings for other cancers besides breast, cervical, colon, and lung cancers.  If you have a strong family history of cancer or have other cancer screening concerns, please let me know.    Bone health: Get at least 150 minutes of aerobic exercise weekly Get weight bearing exercise at least once weekly Bone density - recommended bone density evaluation due to risk factors postmenopausal estrogen deficiency and hereditary factors hx/o osteopenia   Heart health: Get at least 150 minutes of aerobic exercise weekly Limit alcohol It is important to maintain a healthy blood pressure and healthy cholesterol  numbers   Recommendations:  I recommend a yearly ophthalmology/optometry visit for glaucoma screening and eye checkup  I recommended a yearly dental visit for hygiene and checkup  Advanced directives - discussed nature and purpose of Advanced Directives, encouraged them to complete them if they have not done so and/or encouraged them to get [place] a copy if they have done this already.  Referrals today: Gave list of mental health providers for her to choose to establish care    Separate significant issues discussed: Allergies - takes Claritin daily.   Does get fair amount of post nasal drainage .uses both Astelin and flonase, in general, not necessary both every day.  She attributes the snoring to the drainage.  Snoring - possibly due to congestion from allergies, possible OSA.  Consider sleep study  hyperlipidemia - c/t with Crestor daily  hypothyroidism - c/t synthroid 167mcg daily  GERD - uses Protox regularly, occasional Tums  Depression, ADD - taking Lexapro.  Although 10mg  listed, is using 5mg  daily.  Taking Wellbutrin 150mg  XL daily, but hans 't taken adderal 10mg  in several weeks.  Gave list of contacts for mental health providers to establish soon.  Of note, remote hx/o bulemia and she does stress eat as an abnormal coping mechanism  discussed her concern about coronary calcium score.   She had a regular CT chest earlier this year.  I will reach out to radiology about imaging recommendations, whether she even needs a scan.   Reighn was seen today for [employer].  Diagnoses and all orders for this visit:  Hypothyroidism, unspecified type -     TSH -     T4, free  Initial Medicare annual wellness visit  Encounter for health maintenance examination in adult  Thoracic aortic aneurysm without rupture ([place]) -     Lipid panel  Hot flashes  Mild intermittent asthma without complication  Viral gastroenteritis  S/P hysterectomy  History of endometrial  cancer  Bruises easily  Uses hearing aid  Encounter for hepatitis C screening test for low risk patient -     Hepatitis C antibody  Vaccine counseling  Post-menopausal -     DG Bone Density; Future  Estrogen deficiency -     DG Bone Density; Future  Osteopenia, unspecified location -     DG Bone Density; Future  Snoring  Spent 55 minutes face to face with patient in discussion of symptoms, evaluation, plan and recommendations.        Medicare Attestation A preventative services visit was completed today.  During the course of the visit the patient was educated and counseled about appropriate screening and preventive services.  A health risk assessment was established with the patient that included a review of current medications, allergies, social history, family history, medical and preventative health history, biometrics, and preventative screenings to identify potential safety concerns or impairments.  A personalized plan was printed today for the patient's records and use.   Personalized health advice and education was given today to reduce health risks and promote self management and wellness.  Information regarding end of life planning was discussed today.  Kristian Covey, PA-C   02/20/2020

## 2020-02-20 NOTE — Patient Instructions (Signed)
Please call to schedule your bone density scan.   [facility]  [phone] [address] [place], [place] [address]    RESOURCES in [place], [place]  If you are experiencing a mental health crisis or an emergency, please call 911 or go to the nearest emergency department.  [facility]   [phone] [facility]  [phone] [facility]   [phone]  Suicide Hotline 1-800-Suicide (313)813-4704)  National Suicide Prevention Lifeline 9736188498  985-828-4457)  Domestic Violence, Rape/Crisis - [facility] [place] 650-663-4199  The [employer] Violence Hotline 1-800-799-SAFE 629-538-5839)  To report Child or Elder Abuse, please call: [facility] Police Department  [phone] [facility] Department  [phone]  Teen Crisis line 720-105-2454 or [phone]     Psychiatry and Counseling services   [facility] [place], [place], [place] [address] [phone]  Dr. Lynder Parents, psychiatrist Dr. Milana Huntsman, child psychiatrist   Dr. Chucky May, psychiatry [address], [place], [place] [address] [phone]      Counseling Services (NON- psychiatrist offices) [facility] [phone]  [email or web address] [address], [place], [place], [place] [address]    [facility] [phone] [address] [place], [place], [place] [address]    Dr. George Hugh, [place] [phone] [address] [place], [place] [address]

## 2020-02-20 NOTE — Telephone Encounter (Signed)
Hello Dr. Laqueta Carina.  I saw this patient today for well visit.  She requested a coronary CT calcium score test.  I noticed she had a chest CT 06/2019 that your interpreted.   Would this show you everything you need for the calcium score or would she need a separate calcium CT /coronary score test?    Thanks Dorothea Ogle, PA-C

## 2020-02-21 ENCOUNTER — Other Ambulatory Visit: Payer: Self-pay | Admitting: Medical

## 2020-02-21 LAB — LIPID PANEL
Chol/HDL Ratio: 2.5 ratio (ref 0.0–4.4)
Cholesterol, Total: 233 mg/dL — ABNORMAL HIGH (ref 100–199)
HDL: 95 mg/dL (ref >39)
LDL Chol Calc (NIH): 121 mg/dL — ABNORMAL HIGH (ref 0–99)
Triglycerides: 98 mg/dL (ref 0–149)
VLDL Cholesterol Cal: 17 mg/dL (ref 5–40)

## 2020-02-21 LAB — HEPATITIS C ANTIBODY: Hep C Virus Ab: <0.1 s/co ratio (ref 0.0–0.9)

## 2020-02-21 LAB — TSH: TSH: 0.503 u[IU]/mL (ref 0.450–4.500)

## 2020-02-21 LAB — T4, FREE: Free T4: 1.42 ng/dL (ref 0.82–1.77)

## 2020-02-21 MED ORDER — SYNTHROID 125 MCG PO TABS: 125.0000 ug | ORAL_TABLET | Freq: Every day | ORAL | 3 refills | Status: DC

## 2020-02-21 MED ORDER — ROSUVASTATIN CALCIUM 20 MG PO TABS: 20.0000 mg | ORAL_TABLET | Freq: Every day | ORAL | 3 refills | Status: DC

## 2020-03-05 NOTE — Telephone Encounter (Signed)
I called again and the receptionist is going to have radiology call me back

## 2020-03-05 NOTE — Telephone Encounter (Signed)
Sent message to patient

## 2020-03-05 NOTE — Telephone Encounter (Signed)
I called and spoke to the radiologist in reference to a calcium score heart disease screen.  This is a different type of test with different imaging protocol compared to the plain chest CT she had back in May 2021.   He did glance at the CT though and felt like he saw no obvious significant coronary calcifications  So we can either do a specific calcium coronary CT score, or we can hold off for now 1 discussed next year on physical since there was no obvious glaring coronary calcifications

## 2020-03-12 ENCOUNTER — Telehealth: Payer: Self-pay | Admitting: Medical

## 2020-03-12 NOTE — Telephone Encounter (Signed)
Received requested records from [facility] 03/12/2020

## 2020-03-12 NOTE — Telephone Encounter (Signed)
Received requested records from [facility]

## 2020-03-13 ENCOUNTER — Encounter: Payer: Self-pay | Admitting: Medical

## 2020-03-21 ENCOUNTER — Encounter: Payer: Self-pay | Admitting: Medical

## 2020-03-30 ENCOUNTER — Emergency Department (HOSPITAL_COMMUNITY)
Admission: EM | Admit: 2020-03-30 | Discharge: 2020-03-30 | Disposition: A | Discharge status: Home / Self Care | Payer: Medicare Other | Attending: Emergency Medicine | Admitting: Emergency Medicine

## 2020-03-30 ENCOUNTER — Emergency Department (HOSPITAL_COMMUNITY): Payer: Medicare Other

## 2020-03-30 ENCOUNTER — Other Ambulatory Visit: Payer: Self-pay

## 2020-03-30 ENCOUNTER — Encounter (HOSPITAL_COMMUNITY): Payer: Self-pay | Admitting: Emergency Medicine

## 2020-03-30 DIAGNOSIS — S42351A Displaced comminuted fracture of shaft of humerus, right arm, initial encounter for closed fracture: Secondary | ICD-10-CM

## 2020-03-30 DIAGNOSIS — J4520 Mild intermittent asthma, uncomplicated: Secondary | ICD-10-CM | POA: Diagnosis not present

## 2020-03-30 DIAGNOSIS — S92152A Displaced avulsion fracture (chip fracture) of left talus, initial encounter for closed fracture: Secondary | ICD-10-CM | POA: Diagnosis not present

## 2020-03-30 DIAGNOSIS — E039 Hypothyroidism, unspecified: Secondary | ICD-10-CM | POA: Insufficient documentation

## 2020-03-30 DIAGNOSIS — S82892A Other fracture of left lower leg, initial encounter for closed fracture: Secondary | ICD-10-CM

## 2020-03-30 DIAGNOSIS — Z8542 Personal history of malignant neoplasm of other parts of uterus: Secondary | ICD-10-CM | POA: Diagnosis not present

## 2020-03-30 DIAGNOSIS — W01198A Fall on same level from slipping, tripping and stumbling with subsequent striking against other object, initial encounter: Secondary | ICD-10-CM | POA: Diagnosis not present

## 2020-03-30 DIAGNOSIS — Z79899 Other long term (current) drug therapy: Secondary | ICD-10-CM | POA: Diagnosis not present

## 2020-03-30 DIAGNOSIS — S4991XA Unspecified injury of right shoulder and upper arm, initial encounter: Secondary | ICD-10-CM | POA: Diagnosis present

## 2020-03-30 DIAGNOSIS — Y93K1 Activity, walking an animal: Secondary | ICD-10-CM | POA: Insufficient documentation

## 2020-03-30 MED ORDER — ONDANSETRON 4 MG PO TBDP
4.0000 mg | ORAL_TABLET | Freq: Once | ORAL | Status: AC
  Administered 2020-03-30: 4 mg via ORAL
  Filled 2020-03-30: qty 1

## 2020-03-30 MED ORDER — OXYCODONE HCL 5 MG PO TABS
5.0000 mg | ORAL_TABLET | ORAL | 0 refills | Status: AC | PRN
Start: 2020-03-30 — End: 2020-04-02

## 2020-03-30 MED ORDER — ACETAMINOPHEN 500 MG PO TABS: 500.0000 mg | ORAL_TABLET | ORAL | 0 refills | Status: AC | PRN

## 2020-03-30 MED ORDER — ONDANSETRON 4 MG PO TBDP: 4.0000 mg | ORAL_TABLET | Freq: Three times a day (TID) | ORAL | 0 refills | Status: DC | PRN

## 2020-03-30 MED ORDER — FENTANYL CITRATE (PF) 100 MCG/2ML IJ SOLN
100.0000 ug | Freq: Once | INTRAMUSCULAR | Status: AC
Start: 2020-03-30 — End: 2020-03-30
  Administered 2020-03-30: 100 ug via INTRAVENOUS
  Filled 2020-03-30: qty 2

## 2020-03-30 MED ORDER — SODIUM CHLORIDE 0.9 % IV BOLUS
500.0000 mL | Freq: Once | INTRAVENOUS | Status: AC
  Administered 2020-03-30: 500 mL via INTRAVENOUS

## 2020-03-30 NOTE — ED Provider Notes (Signed)
[name] DEPT Provider Note   CSN: [id number] Arrival date & time: 03/30/20  1036     History Chief Complaint  Patient presents with  . Fall  . Shoulder Pain  . Ankle Pain    Jamie Singh is a 70 y.o. female.  HPI 70 year old female with history of asthma, endometrial cancer, GERD, hypothyroidism, thoracic aortic aneurysm presents to the ER after a fall.  Patient states that she was walking her dog this morning and slipped and fell onto her right shoulder also hurt her left ankle.  She has not been able to move her right shoulder since the fall due to the pain.  Denies any numbness or tingling.  Was able to ambulate on her left ankle but does report some pain.  States most of her pain is coming from her shoulder.  She denies any head injury or LOC.  Not on blood thinners.    Past Medical History:  Diagnosis Date  . Asthma    mild, but prior on preventative FLovent  . Endometrial cancer ([place]) 2020  . Genital herpes   . GERD (gastroesophageal reflux disease)   . Hypothyroidism   . Thoracic aortic aneurysm ([place])   . Wears hearing aid     Patient Active Problem List   Diagnosis Date Noted  . Initial Medicare annual wellness visit 02/20/2020  . Encounter for health maintenance examination in adult 02/20/2020  . Uses hearing aid 02/20/2020  . Encounter for hepatitis C screening test for low risk patient 02/20/2020  . Vaccine counseling 02/20/2020  . Post-menopausal 02/20/2020  . Estrogen deficiency 02/20/2020  . Osteopenia 02/20/2020  . Snoring 02/20/2020  . History of eating disorder 02/20/2020  . Diarrhea 01/08/2020  . Generalized abdominal pain 01/08/2020  . Hot flashes 10/18/2019  . History of endometrial cancer 10/18/2019  . Mild intermittent asthma without complication 78/29/5621  . Thoracic aortic aneurysm without rupture ([place]) 10/18/2019  . S/P hysterectomy 10/18/2019  . Bruises easily 10/18/2019  . Excessive sweating  10/18/2019  . Hypothyroidism 10/18/2019    Past Surgical History:  Procedure Laterality Date  . COLONOSCOPY  2019  . ROBOTIC ASSISTED TOTAL HYSTERECTOMY WITH BILATERAL SALPINGO OOPHERECTOMY     RA-TLH/BSO  . TONSILLECTOMY AND ADENOIDECTOMY       OB History    Gravida  2   Para  0   Term      Preterm      AB      Living        SAB      IAB      Ectopic      Multiple      Live Births              Family History  Problem Relation Age of Onset  . COPD Mother   . Heart failure Mother   . Macular degeneration Mother   . Osteoporosis Mother   . Cancer Mother        Mom needed a hysterectomy, thinks that she may have had uterine cancer  . Heart disease Father        MI  . COPD Father   . Aortic aneurysm Father   . Heart disease Brother        MI  . Myelodysplastic syndrome Brother   . Osteoporosis Maternal Grandmother   . Heart failure Maternal Grandmother   . Heart failure Maternal Grandfather   . COPD Maternal Grandfather   . Stroke Paternal Grandfather   .  Breast cancer Neg Hx   . Colon cancer Neg Hx     Social History   Tobacco Use  . Smoking status: Never Smoker  . Smokeless tobacco: Never Used  Substance Use Topics  . Alcohol use: Yes    Alcohol/week: 6.0 standard drinks    Types: 2 Glasses of wine, 2 Cans of beer, 2 Shots of liquor per week  . Drug use: Yes    Types: Marijuana    Comment: gummies    Home Medications Prior to Admission medications   Medication Sig Start Date End Date Taking? Authorizing Provider  acetaminophen (TYLENOL) 500 MG tablet Take 1 tablet (500 mg total) by mouth every 4 (four) hours as needed for up to 7 days. 03/30/20 04/06/20 Yes Garald Balding, PA-C  ondansetron (ZOFRAN ODT) 4 MG disintegrating tablet Take 1 tablet (4 mg total) by mouth every 8 (eight) hours as needed for nausea or vomiting. 03/30/20  Yes Garald Balding, PA-C  oxyCODONE (ROXICODONE) 5 MG immediate release tablet Take 1 tablet (5 mg total) by  mouth every 4 (four) hours as needed for up to 3 days for severe pain. 03/30/20 04/02/20 Yes Garald Balding, PA-C  albuterol (VENTOLIN HFA) 108 (90 Base) MCG/ACT inhaler Inhale into the lungs.    [provider]  amphetamine-dextroamphetamine (ADDERALL) 10 MG tablet Take 1 tablet (10 mg total) by mouth daily with breakfast. 11/09/19   Tysinger, Camelia Eng, PA-C  azelastine (ASTELIN) 0.1 % nasal spray  05/22/18   [provider]  buPROPion (WELLBUTRIN XL) 150 MG 24 hr tablet  09/10/19   [provider]  escitalopram (LEXAPRO) 10 MG tablet  08/01/18   [provider]  Multiple Vitamins-Minerals (MULTI COMPLETE/IRON) TABS Take by mouth.    [provider]  pantoprazole (PROTONIX) 40 MG tablet Take 40 mg by mouth daily.    [provider]  rosuvastatin (CRESTOR) 20 MG tablet Take 1 tablet (20 mg total) by mouth daily. 02/21/20 02/20/21  Tysinger, Camelia Eng, PA-C  SYNTHROID 125 MCG tablet Take 1 tablet (125 mcg total) by mouth daily. 02/21/20   Tysinger, Camelia Eng, PA-C  valACYclovir (VALTREX) 500 MG tablet Take 500 mg by mouth as needed.    [provider]    Allergies    Pollen extract and Other  Review of Systems   Review of Systems  Constitutional: Negative for diaphoresis and fever.  Musculoskeletal: Positive for arthralgias.  Neurological: Negative for weakness, numbness and headaches.    Physical Exam Updated Vital Signs BP 113/78 (BP Location: Left Arm)   Pulse 69   Temp 98.3 F (36.8 C) (Oral)   Resp 17   Ht 5' (1.524 m)   Wt 68 kg   SpO2 95%   BMI 29.29 kg/m   Physical Exam Vitals and nursing note reviewed.  Constitutional:      General: She is not in acute distress.    Appearance: She is well-developed and well-nourished.  HENT:     Head: Normocephalic and atraumatic.  Eyes:     Conjunctiva/sclera: Conjunctivae normal.  Cardiovascular:     Rate and Rhythm: Normal rate and regular rhythm.     Heart sounds: No murmur  heard.   Pulmonary:     Effort: Pulmonary effort is normal. No respiratory distress.     Breath sounds: Normal breath sounds.  Abdominal:     Palpations: Abdomen is soft.     Tenderness: There is no abdominal tenderness.  Musculoskeletal:  General: No edema.     Cervical back: Neck supple.     Comments: Right shoulder with no visible step-offs or crepitus, however range of motion severely limited secondary to pain.  2+ radial pulses bilaterally. Sensations intact.  Equal grip strength.  Left ankle with 5/5 strength with dorsiflexion and plantarflexion, 2+ DP pulses, mild tenderness palpation to the medial malleolus.  No visible discolorations,<2 cap refill in her lower extremities and upper extremities  Skin:    General: Skin is warm and dry.  Neurological:     General: No focal deficit present.     Mental Status: She is alert.  Psychiatric:        Mood and Affect: Mood and affect normal.     ED Results / Procedures / Treatments   Labs (all labs ordered are listed, but only abnormal results are displayed) Labs Reviewed - No data to display  EKG None  Radiology DG Shoulder Right  Result Date: 03/30/2020 CLINICAL DATA:  Fall, pain. EXAM: RIGHT SHOULDER - 2+ VIEW COMPARISON:  None. FINDINGS: Significantly displaced/comminuted fracture of the RIGHT humeral neck with extension to the greater tuberosity. Probable inferior subluxation of the humeral head relative to the glenoid fossa, but no frank humeral head dislocation. Overlying acromioclavicular joint space is normally aligned. No convincing fracture within the adjacent scapula or RIGHT upper ribs. IMPRESSION: 1. Significantly displaced/comminuted fracture of the RIGHT humeral neck with extension to the greater tuberosity. 2. Probable inferior subluxation of the humeral head relative to the glenoid fossa, but no frank humeral head dislocation. Electronically Signed   By: Franki Cabot M.D.   On: 03/30/2020 11:50   DG Ankle  Complete Left  Result Date: 03/30/2020 CLINICAL DATA:  Fall, pain EXAM: LEFT ANKLE COMPLETE - 3+ VIEW COMPARISON:  None. FINDINGS: Slightly displaced avulsion fracture at the distal aspect of the lateral malleolus. Distal tibia and medial malleolus appear intact and normally aligned. Ankle mortise is grossly symmetric. Visualized portions of the hindfoot and midfoot appear intact and normally aligned. IMPRESSION: Slightly displaced avulsion fracture at the distal aspect of the lateral malleolus. Electronically Signed   By: Franki Cabot M.D.   On: 03/30/2020 11:48    Procedures Procedures   Medications Ordered in ED Medications  fentaNYL (SUBLIMAZE) injection 100 mcg (100 mcg Intravenous Given 03/30/20 1157)  ondansetron (ZOFRAN-ODT) disintegrating tablet 4 mg (4 mg Oral Given 03/30/20 1240)  sodium chloride 0.9 % bolus 500 mL (0 mLs Intravenous Stopped 03/30/20 1329)    ED Course  I have reviewed the triage vital signs and the nursing notes.  Pertinent labs & imaging results that were available during my care of the patient were reviewed by me and considered in my medical decision making (see chart for details).    MDM Rules/Calculators/A&P                          70 year old female presents after fall. Plain films consistent with a significantly displaced/comminuted fracture of the right humeral neck with extension to the greater tuberosity, probably inferior subluxation of the humeral head relative to the glenoid fossa, but no frank humeral head dislocation.  Left ankle x-ray with a slightly displaced avulsion fracture of the distal aspect of the lateral malleolus.  I did touch base with Dr. Mayer Camel with the [facility] to review the imaging, he also feels like there is no actual dislocation of the shoulder. No evidence of open fracture. He recommends sling, cam walker for  the ankle, following up in the office.  Patient was given 100mg  fentanyl of analgesia here given severe pain, patient did report  some nausea and dizziness with this, blood pressure overall reassuring.  She was given 500 cc of fluid and some Zofran. Reports mild improvement in symptoms. Patient requesting Tylenol extra strength, prescription provided. We will prescribed Roxicet for breakthrough pain. Also sent home with Zofran for nausea. Encouraged to keep the sling on until she follows up with orthopedics.  She voiced understanding and is agreeable.  At this stage in the ED course, the patient is medically screened and stable for discharge. Final Clinical Impression(s) / ED Diagnoses Final diagnoses:  Closed displaced comminuted fracture of shaft of right humerus, initial encounter  Avulsion fracture of left ankle, closed, initial encounter    Rx / DC Orders ED Discharge Orders         Ordered    acetaminophen (TYLENOL) 500 MG tablet  Every 4 hours PRN        03/30/20 1342    oxyCODONE (ROXICODONE) 5 MG immediate release tablet  Every 4 hours PRN        03/30/20 1342    ondansetron (ZOFRAN ODT) 4 MG disintegrating tablet  Every 8 hours PRN        03/30/20 1343           Lyndel Safe 03/30/20 1347    Dorie Rank, MD 03/31/20 [phone]

## 2020-03-30 NOTE — Discharge Instructions (Addendum)
Thank you for letting us take care of you in the ER. Your x-rays today showed a fracture of the shoulder and left ankle. As discussed, please make sure to follow-up with Dr. Damita Dunnings office for an appointment. You may take Tylenol, use oxycodone for breakthrough pain. Please wear the sling until you follow-up with orthopedics. Also wear the boot as well. Please keep your leg elevated. You may take Zofran for nausea. Return to the ER for any new or worsening symptoms.

## 2020-03-30 NOTE — ED Notes (Signed)
Ortho tech at bedside 

## 2020-03-30 NOTE — ED Triage Notes (Signed)
Patient was walking her dog and slipped and fell onto her R shoulder and also hurt her L ankle.

## 2020-03-30 NOTE — Progress Notes (Signed)
Orthopedic Tech Progress Note Patient Details:  Jamie Singh 1950/07/19 [id number]  Ortho Devices Ortho Device/Splint Location: applied cam walker to LLE, and shoulder sling to RUE Ortho Device/Splint Interventions: Ordered,Application   Post Interventions Patient Tolerated: Well Instructions Provided: Care of device   Braulio Bosch 03/30/2020, 12:52 PM

## 2020-04-04 ENCOUNTER — Other Ambulatory Visit: Payer: Self-pay

## 2020-04-04 MED ORDER — PANTOPRAZOLE SODIUM 40 MG PO TBEC: 40.0000 mg | DELAYED_RELEASE_TABLET | Freq: Every day | ORAL | 0 refills | Status: DC

## 2020-05-09 ENCOUNTER — Encounter: Payer: Self-pay | Admitting: Gynecologic Oncology

## 2020-05-09 DIAGNOSIS — C541 Malignant neoplasm of endometrium: Secondary | ICD-10-CM | POA: Insufficient documentation

## 2020-05-12 ENCOUNTER — Telehealth: Payer: Self-pay | Admitting: *Deleted

## 2020-05-12 NOTE — Telephone Encounter (Signed)
Patient called and moved her appt from 3/16 to 3/22

## 2020-05-14 ENCOUNTER — Inpatient Hospital Stay: Payer: Medicare Other | Admitting: Gynecologic Oncology

## 2020-05-19 ENCOUNTER — Other Ambulatory Visit: Payer: Self-pay

## 2020-05-19 ENCOUNTER — Encounter: Payer: Self-pay | Admitting: Medical

## 2020-05-19 ENCOUNTER — Ambulatory Visit (INDEPENDENT_AMBULATORY_CARE_PROVIDER_SITE_OTHER): Payer: Medicare Other | Admitting: Medical

## 2020-05-19 VITALS — BP 136/88 | HR 72 | Ht 60.0 in | Wt 150.6 lb

## 2020-05-19 DIAGNOSIS — R110 Nausea: Secondary | ICD-10-CM | POA: Insufficient documentation

## 2020-05-19 DIAGNOSIS — K219 Gastro-esophageal reflux disease without esophagitis: Secondary | ICD-10-CM | POA: Diagnosis not present

## 2020-05-19 DIAGNOSIS — I77810 Thoracic aortic ectasia: Secondary | ICD-10-CM | POA: Diagnosis not present

## 2020-05-19 DIAGNOSIS — I351 Nonrheumatic aortic (valve) insufficiency: Secondary | ICD-10-CM | POA: Diagnosis not present

## 2020-05-19 DIAGNOSIS — S42301D Unspecified fracture of shaft of humerus, right arm, subsequent encounter for fracture with routine healing: Secondary | ICD-10-CM | POA: Insufficient documentation

## 2020-05-19 DIAGNOSIS — E039 Hypothyroidism, unspecified: Secondary | ICD-10-CM

## 2020-05-19 MED ORDER — FAMOTIDINE 20 MG PO TABS: 20.0000 mg | ORAL_TABLET | Freq: Every day | ORAL | 1 refills | Status: DC

## 2020-05-19 MED ORDER — ONDANSETRON 4 MG PO TBDP: 4.0000 mg | ORAL_TABLET | Freq: Three times a day (TID) | ORAL | 0 refills | Status: DC | PRN

## 2020-05-19 NOTE — Progress Notes (Signed)
Subjective:  Jamie Singh is a 70 y.o. female who presents for Chief Complaint  Patient presents with  . Nausea    Reoccurring nausea.        Here for recurring nausea.    She had a recent broken shoulder 03/2020, been seeing orthopedics.   Had terrible reaction to Fentanyl at emergency dept.   Was initially thinking nausea and vomiting was due to the fentanyl.  At the emergency dept, in minutes of getting fentanyl was having chills, freezing, vomiting, and had nausea.    At least once per week, getting vague nausea.  Heartburn seems worse of late.  Taking pantoprazole.  No pain or discomfit after eating.  No nausea after eating.  No recent vomiting.  No nausea in last 10 days.  Uses psyllium for bowels, 3 capsules daily   Bowels have been regular of late.  No recent diarrhea. No recent blood in stool.  Drinks few glass of wine per week. No fever, but sometimes has night sweats.  Has hx/o endometrial cancer 2 years ago.   Last endoscopy 2020, had a lot of inflammation.  When she started statin in past year, was having some issues, and went to every other day.  In recent months she stopped Wellbutrin, stopped Lexapro. Not currently using adderal  No other aggravating or relieving factors.    No other c/o.  Past Medical History:  Diagnosis Date  . Asthma    mild, but prior on preventative FLovent  . Endometrial cancer ([place]) 2020  . Genital herpes   . GERD (gastroesophageal reflux disease)   . Hypothyroidism   . Thoracic aortic aneurysm ([place])   . Wears hearing aid      The following portions of the patient's history were reviewed and updated as appropriate: allergies, current medications, past family history, past medical history, past social history, past surgical history and problem list.  ROS Otherwise as in subjective above    Objective: BP 136/88   Pulse 72   Ht 5' (1.524 m)   Wt 150 lb 9.6 oz (68.3 kg)   SpO2 98%   BMI 29.41 kg/m   Wt Readings from Last 3  Encounters:  05/19/20 150 lb 9.6 oz (68.3 kg)  03/30/20 150 lb (68 kg)  02/20/20 149 lb (67.6 kg)   BP Readings from Last 3 Encounters:  05/19/20 136/88  03/30/20 126/72  02/20/20 124/86    General appearance: alert, no distress, well developed, well nourished Neck: supple, no lymphadenopathy, no thyromegaly, no masses Heart: RRR, normal S1, S2, no murmurs Lungs: CTA bilaterally, no wheezes, rhonchi, or rales Abdomen: +bs, soft, non tender, non distended, no masses, no hepatomegaly, no splenomegaly Pulses: 2+ radial pulses, 2+ pedal pulses, normal cap refill Ext: no edema Right arm in sling today    Assessment: Encounter Diagnoses  Name Primary?  . Chronic nausea Yes  . Dilated aortic root ([place])   . Aortic valve insufficiency, etiology of cardiac valve disease unspecified   . Gastroesophageal reflux disease, unspecified whether esophagitis present   . Closed fracture of shaft of right humerus with routine healing, unspecified fracture morphology, subsequent encounter   . Hypothyroidism, unspecified type      Plan: Chronic nausea-we discussed possible causes.  She will continue pantoprazole.  However she will change the timing of her medications.  She will continue her thyroid medicine initially in the morning on an empty stomach.  After 45 to 60 minutes she will do the pantoprazole and wait 30 minutes  before any other food or medications.  She will hold off and stop multivitamin for the next few weeks in case that is causing a problem with nausea.  We will try to request copy of prior endoscopy from 2020.  She mentions some "inflammation".  I want to review and see if there was any mention of Barrett's esophagus.  Dilated aortic root, aortic regurgitation -reviewed last echocardiogram from August 2021.  She sees a specialist at [facility] for surveillance  Hypothyroidism-continue current medication, discussed proper use of medication  GERD-continue pantoprazole, add famotidine at  night, avoid GERD triggers  Humerus fracture-follow-up with orthopedics as planned, continue splint  She will get back on calcium supplement and vitamin D supplement as well  Jamie Singh was seen today for nausea.  Diagnoses and all orders for this visit:  Chronic nausea  Dilated aortic root ([place])  Aortic valve insufficiency, etiology of cardiac valve disease unspecified  Gastroesophageal reflux disease, unspecified whether esophagitis present  Closed fracture of shaft of right humerus with routine healing, unspecified fracture morphology, subsequent encounter  Hypothyroidism, unspecified type  Other orders -     ondansetron (ZOFRAN ODT) 4 MG disintegrating tablet; Take 1 tablet (4 mg total) by mouth every 8 (eight) hours as needed for nausea or vomiting. -     famotidine (PEPCID) 20 MG tablet; Take 1 tablet (20 mg total) by mouth at bedtime.    Follow up: 3-4 weeks

## 2020-05-19 NOTE — Patient Instructions (Signed)
Recommendations:  Continue your thyroid medication daily first in the morning on empty stomach  Wait 45-60 minutes before using pantoprazole  Then wait at least another 30 minutes before you eat breakfast and take the rest of your medicaiton  Begin Famotidine 20mg  tablet at bedtime to help with reflux and nausea  Use Zofran as needed for nausea  STOP multivitamin for the next 3-4 weeks and see if that has any bearing on the nausea  Lets recheck in 3-4 weeks  We will request prior endoscopy record     Nausea, Adult Nausea is feeling sick to your stomach or feeling that you are about to throw up (vomit). Feeling sick to your stomach is usually not serious, but it may be an early sign of a more serious medical problem. As you feel sicker to your stomach, you may throw up. If you throw up, or if you are not able to drink enough fluids, there is a risk that you may lose too much water in your body (get dehydrated). If you lose too much water in your body, you may:  Feel tired.  Feel thirsty.  Have a dry mouth.  Have cracked lips.  Go pee (urinate) less often. Older adults and people who have other diseases or a weak body defense system (immune system) have a higher risk of losing too much water in the body. The main goals of treating this condition are:  To relieve your nausea.  To ensure your nausea occurs less often.  To prevent throwing up and losing too much fluid. Follow these instructions at home: Watch your symptoms for any changes. Tell your doctor about them. Follow these instructions as told by your doctor. Eating and drinking  Take an ORS (oral rehydration solution). This is a drink that is sold at pharmacies and stores.  Drink clear fluids in small amounts as you are able. These include: ? Water. ? Ice chips. ? Fruit juice that has water added (diluted fruit juice). ? Low-calorie sports drinks.  Eat bland, easy-to-digest foods in small amounts as you are  able, such as: ? Bananas. ? Applesauce. ? Rice. ? Low-fat (lean) meats. ? Toast. ? Crackers.  Avoid drinking fluids that have a lot of sugar or caffeine in them. This includes energy drinks, sports drinks, and soda.  Avoid alcohol.  Avoid spicy or fatty foods.      General instructions  Take over-the-counter and prescription medicines only as told by your doctor.  Rest at home while you get better.  Drink enough fluid to keep your pee (urine) pale yellow.  Take slow and deep breaths when you feel sick to your stomach.  Avoid food or things that have strong smells.  Wash your hands often with soap and water. If you cannot use soap and water, use hand sanitizer.  Make sure that all people in your home wash their hands well and often.  Keep all follow-up visits as told by your doctor. This is important. Contact a doctor if:  You feel sicker to your stomach.  You feel sick to your stomach for more than 2 days.  You throw up.  You are not able to drink fluids without throwing up.  You have new symptoms.  You have a fever.  You have a headache.  You have muscle cramps.  You have a rash.  You have pain while peeing.  You feel light-headed or dizzy. Get help right away if:  You have pain in your chest, neck,  arm, or jaw.  You feel very weak or you pass out (faint).  You have throw up that is bright red or looks like coffee grounds.  You have bloody or black poop (stools) or poop that looks like tar.  You have a very bad headache, a stiff neck, or both.  You have very bad pain, cramping, or bloating in your belly (abdomen).  You have trouble breathing or you are breathing very quickly.  Your heart is beating very quickly.  Your skin feels cold and clammy.  You feel confused.  You have signs of losing too much water in your body, such as: ? Dark pee, very little pee, or no pee. ? Cracked lips. ? Dry mouth. ? Sunken  eyes. ? Sleepiness. ? Weakness. These symptoms may be an emergency. Do not wait to see if the symptoms will go away. Get medical help right away. Call your local emergency services (911 in the [place] Do not drive yourself to the hospital. Summary  Nausea is feeling sick to your stomach or feeling that you are about to throw up (vomit).  If you throw up, or if you are not able to drink enough fluids, there is a risk that you may lose too much water in your body (get dehydrated).  Eat and drink what your doctor tells you. Take over-the-counter and prescription medicines only as told by your doctor.  Contact a doctor right away if your symptoms get worse or you have new symptoms.  Keep all follow-up visits as told by your doctor. This is important. This information is not intended to replace advice given to you by your health care provider. Make sure you discuss any questions you have with your health care provider. Document Revised: 01/16/2019 Document Reviewed: 07/26/2017 Elsevier Patient Education  2021 [employer].

## 2020-05-20 ENCOUNTER — Other Ambulatory Visit: Payer: Self-pay

## 2020-05-20 ENCOUNTER — Inpatient Hospital Stay: Payer: Medicare Other | Attending: Gynecologic Oncology | Admitting: Gynecologic Oncology

## 2020-05-20 ENCOUNTER — Encounter: Payer: Self-pay | Admitting: Gynecologic Oncology

## 2020-05-20 VITALS — BP 128/76 | HR 82 | Temp 97.3°F | Resp 18 | Wt 148.6 lb

## 2020-05-20 DIAGNOSIS — Z923 Personal history of irradiation: Secondary | ICD-10-CM | POA: Diagnosis not present

## 2020-05-20 DIAGNOSIS — Z90710 Acquired absence of both cervix and uterus: Secondary | ICD-10-CM | POA: Diagnosis not present

## 2020-05-20 DIAGNOSIS — Z8542 Personal history of malignant neoplasm of other parts of uterus: Secondary | ICD-10-CM | POA: Diagnosis present

## 2020-05-20 DIAGNOSIS — Z90722 Acquired absence of ovaries, bilateral: Secondary | ICD-10-CM | POA: Diagnosis not present

## 2020-05-20 NOTE — Patient Instructions (Signed)
No evidence of cancer on your exam today. There has been some scar tissue formation at the top of your vagina. Please try to use the vaginal dilator 1-2 times a week for the next few months. I will see you back in June and then we will plan to transition to visits every 6 months.

## 2020-05-20 NOTE — Progress Notes (Signed)
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  On 08/18/2018 she underwent robotic assisted total laparoscopic hysterectomy, bilateral salpingo-oophorectomy, bilateral sentinel lymph node dissection, and collection of pelvic washings.  Pathology revealed a tumor size of 4.5 cm, myometrial invasion 42% (8 of 19 mm), no LVSI.  Was performed at [facility] with Dr. Fleet Singh.  Patient then received adjuvant vaginal brachytherapy (vaginal cuff HDR, 4 fractions of 5.5 Gray, treated from 8/11-8/21/2020).  The patient was last seen for surveillance visit in [place] with Dr. Jake Singh on 10/11.  At that time she underwent Pap test that was negative.  Interval History: Patient presents today for surveillance visit.  She fell at the end of January and unfortunately twisted her ankle and broke her shoulder.  She comes in in a brace today that is more for comfort when she is out and about.  She is in physical therapy.  Since her last visit with me, she also saw a cardiothoracic surgeon at [facility] and got a second opinion at [facility] in the setting of her aortic aneurysm.  Plan is for follow-up imaging to assess whether there is any change in size but given current size no surgery recommended.  She denies any vaginal bleeding or discharge.  She has not been using her vaginal dilator mostly in the setting of her injury.  She reports regular bowel and bladder function.  She endorses a good appetite without nausea or emesis.  She does note some nausea around the time of her injury which she thinks was related to pain medication she received in the emergency department and on discharge.  Past Medical/Surgical History: Past Medical History:  Diagnosis Date  . Asthma    mild, but prior on  preventative FLovent  . Endometrial cancer (Jamie Singh) 2020  . Genital herpes   . GERD (gastroesophageal reflux disease)   . Hypothyroidism   . Thoracic aortic aneurysm (Jamie Singh)   . Wears hearing aid     Past Surgical History:  Procedure Laterality Date  . COLONOSCOPY  2019  . ROBOTIC ASSISTED TOTAL HYSTERECTOMY WITH BILATERAL SALPINGO OOPHERECTOMY     RA-TLH/BSO  . TONSILLECTOMY AND ADENOIDECTOMY      Family History  Problem Relation Age of Onset  . COPD Mother   . Heart failure Mother   . Macular degeneration Mother   . Osteoporosis Mother   . Cancer Mother        Mom needed a hysterectomy, thinks that she may have had uterine cancer  . Heart disease Father        MI  . COPD Father   . Aortic aneurysm Father   . Heart disease Brother        MI  . Myelodysplastic syndrome Brother   . Osteoporosis Maternal Grandmother   . Heart failure Maternal Grandmother   . Heart failure Maternal Grandfather   . COPD Maternal Grandfather   . Stroke Paternal Grandfather   . Breast cancer Neg Hx   . Colon cancer Neg Hx     Social History   Socioeconomic History  . Marital status: Married    Spouse name: Not on file  . Number of children: Not on file  . Years of education: Not on file  . Highest education level: Not on file  Occupational History  .  Occupation: Environmental education officer - retired  Tobacco Use  . Smoking status: Never Smoker  . Smokeless tobacco: Never Used  Substance and Sexual Activity  . Alcohol use: Yes    Alcohol/week: 6.0 standard drinks    Types: 2 Glasses of wine, 2 Cans of beer, 2 Shots of liquor per week  . Drug use: Yes    Types: Marijuana    Comment: gummies  . Sexual activity: Not Currently  Other Topics Concern  . Not on file  Social History Narrative   Lives with husband and dog.  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   Social Determinants of Health   Financial Resource Strain: Not on file  Food Insecurity: Not on file  Transportation Needs: Not on file   Physical Activity: Not on file  Stress: Not on file  Social Connections: Not on file    Current Medications:  Current Outpatient Medications:  .  albuterol (VENTOLIN HFA) 108 (90 Base) MCG/ACT inhaler, Inhale into the lungs., Disp: , Rfl:  .  amphetamine-dextroamphetamine (ADDERALL) 10 MG tablet, Take 1 tablet (10 mg total) by mouth daily with breakfast., Disp: 90 tablet, Rfl: 0 .  azelastine (ASTELIN) 0.1 % nasal spray, , Disp: , Rfl:  .  famotidine (PEPCID) 20 MG tablet, Take 1 tablet (20 mg total) by mouth at bedtime., Disp: 30 tablet, Rfl: 1 .  Multiple Vitamins-Minerals (MULTI COMPLETE/IRON) TABS, Take by mouth., Disp: , Rfl:  .  ondansetron (ZOFRAN ODT) 4 MG disintegrating tablet, Take 1 tablet (4 mg total) by mouth every 8 (eight) hours as needed for nausea or vomiting., Disp: 20 tablet, Rfl: 0 .  pantoprazole (PROTONIX) 40 MG tablet, Take 1 tablet (40 mg total) by mouth daily., Disp: 90 tablet, Rfl: 0 .  rosuvastatin (CRESTOR) 20 MG tablet, Take 1 tablet (20 mg total) by mouth daily., Disp: 90 tablet, Rfl: 3 .  SYNTHROID 125 MCG tablet, Take 1 tablet (125 mcg total) by mouth daily., Disp: 90 tablet, Rfl: 3 .  valACYclovir (VALTREX) 500 MG tablet, Take 500 mg by mouth as needed., Disp: , Rfl:   Review of Systems: Denies appetite changes, fevers, chills, fatigue, unexplained weight changes. Denies hearing loss, neck lumps or masses, mouth sores, ringing in ears or voice changes. Denies cough or wheezing.  Denies shortness of breath. Denies chest pain or palpitations. Denies leg swelling. Denies abdominal distention, pain, blood in stools, constipation, diarrhea, nausea, vomiting, or early satiety. Denies pain with intercourse, dysuria, frequency, hematuria or incontinence. Denies hot flashes, pelvic pain, vaginal bleeding or vaginal discharge.   Denies joint pain, back pain or muscle pain/cramps. Denies itching, rash, or wounds. Denies dizziness, headaches, numbness or  seizures. Denies swollen lymph nodes or glands, denies easy bruising or bleeding. Denies anxiety, depression, confusion, or decreased concentration.  Physical Exam: BP 128/76 (BP Location: Left Arm, Patient Position: Sitting)   Pulse 82   Temp (!) 97.3 F (36.3 C) (Tympanic)   Resp 18   Wt 148 lb 9.6 oz (67.4 kg)   SpO2 100%   BMI 29.02 kg/m  General: Alert, oriented, no acute distress. HEENT: Atraumatic, normocephalic, sclera anicteric. Chest: Unlabored breathing on room air. Abdomen: soft, nontender.  Normoactive bowel sounds.  No masses or hepatosplenomegaly appreciated.  Well-healed incisions. Extremities: Grossly normal range of motion.  Warm, well perfused.  No edema bilaterally. Skin: No rashes or lesions noted. Lymphatics: No cervical, supraclavicular, or inguinal adenopathy. GU: Normal appearing external genitalia without erythema, excoriation, or lesions.  Speculum exam reveals moderately atrophic  vaginal mucosa, agglutination of the vagina noted at approximately 4 cm.  No lesions or masses noted.  On bimanual exam, entire upper vagina agglutinated, unable to fractionate.  On rectovaginal exam, no thickening, nodularity, or masses appreciated.    Laboratory & Radiologic Studies: None new  Assessment & Plan: Jamie Singh is a 70 y.o. 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  Patient continues to do well from a gynecologic cancer standpoint.  Unfortunately, she has had some agglutination again at the top of the vagina which I was unable to fractionate on today's exam.  I think this is likely because she has not used her vaginal dilators since her last visit.  She is motivated to restart doing this and we discussed the importance again.  We had initially talked about transitioning to visits every 6 months, but I am going to plan to see her  back in 3 months to see if with some dilator use I am able to fractionate any of the vaginal adhesions.  She will be approximately 2 years out from her diagnosis at her next visit.  We will plan to transition to visits every 6 months until she is 5 years out.  We reviewed signs and symptoms that would be concerning for disease recurrence, and she understands the importance of calling if she develops any of these before her next scheduled visit.  32 minutes of total time was spent for this patient encounter, including preparation, face-to-face counseling with the patient and coordination of care, and documentation of the encounter.  Jeral Pinch, MD  Division of Gynecologic Oncology  Department of Obstetrics and Gynecology  [facility]

## 2020-05-27 ENCOUNTER — Telehealth: Payer: Self-pay | Admitting: Medical

## 2020-05-27 NOTE — Telephone Encounter (Signed)
Requested records received from [facility]

## 2020-05-30 ENCOUNTER — Encounter: Payer: Self-pay | Admitting: Medical

## 2020-06-03 ENCOUNTER — Other Ambulatory Visit: Payer: Medicare Other

## 2020-06-16 ENCOUNTER — Other Ambulatory Visit: Payer: Self-pay | Admitting: Medical

## 2020-06-25 ENCOUNTER — Telehealth: Payer: Self-pay | Admitting: Nurse Practitioner

## 2020-06-25 NOTE — Telephone Encounter (Signed)
Doesn't look like this is my patient.

## 2020-07-09 ENCOUNTER — Telehealth: Payer: Self-pay

## 2020-07-09 ENCOUNTER — Other Ambulatory Visit: Payer: Self-pay

## 2020-07-09 MED ORDER — FAMOTIDINE 20 MG PO TABS: 20.0000 mg | ORAL_TABLET | Freq: Every day | ORAL | 1 refills | Status: DC

## 2020-07-09 NOTE — Telephone Encounter (Signed)
Received fax from [facility] for a refill on famotidine pt. Last apt 05/19/20 next apt is 08/20/20.

## 2020-07-09 NOTE — Telephone Encounter (Signed)
Done

## 2020-08-15 ENCOUNTER — Telehealth: Payer: Self-pay

## 2020-08-15 NOTE — Telephone Encounter (Signed)
Pt. Called stating she has an apt. On Wednesday with you she wanted to know if you could check her lipids and thyroid on her blood work and she would come in fasting for the lipid check.

## 2020-08-20 ENCOUNTER — Other Ambulatory Visit: Payer: Self-pay

## 2020-08-20 ENCOUNTER — Encounter: Payer: Self-pay | Admitting: Medical

## 2020-08-20 ENCOUNTER — Ambulatory Visit (INDEPENDENT_AMBULATORY_CARE_PROVIDER_SITE_OTHER): Payer: Medicare Other | Admitting: Medical

## 2020-08-20 VITALS — BP 120/84 | HR 70 | Ht 60.0 in | Wt 151.2 lb

## 2020-08-20 DIAGNOSIS — L609 Nail disorder, unspecified: Secondary | ICD-10-CM | POA: Diagnosis not present

## 2020-08-20 DIAGNOSIS — E039 Hypothyroidism, unspecified: Secondary | ICD-10-CM

## 2020-08-20 DIAGNOSIS — M8580 Other specified disorders of bone density and structure, unspecified site: Secondary | ICD-10-CM

## 2020-08-20 DIAGNOSIS — Z780 Asymptomatic menopausal state: Secondary | ICD-10-CM

## 2020-08-20 DIAGNOSIS — E7800 Pure hypercholesterolemia, unspecified: Secondary | ICD-10-CM

## 2020-08-20 DIAGNOSIS — Z90710 Acquired absence of both cervix and uterus: Secondary | ICD-10-CM

## 2020-08-20 DIAGNOSIS — R350 Frequency of micturition: Secondary | ICD-10-CM

## 2020-08-20 DIAGNOSIS — R110 Nausea: Secondary | ICD-10-CM | POA: Insufficient documentation

## 2020-08-20 DIAGNOSIS — Z8542 Personal history of malignant neoplasm of other parts of uterus: Secondary | ICD-10-CM

## 2020-08-20 DIAGNOSIS — E559 Vitamin D deficiency, unspecified: Secondary | ICD-10-CM

## 2020-08-20 LAB — POCT URINALYSIS DIP (PROADVANTAGE DEVICE)
Bilirubin, UA: NEGATIVE
Glucose, UA: NEGATIVE mg/dL
Ketones, POC UA: NEGATIVE mg/dL
Leukocytes, UA: NEGATIVE
Nitrite, UA: NEGATIVE
Protein Ur, POC: NEGATIVE mg/dL
Specific Gravity, Urine: 1.01
Urobilinogen, Ur: 0.2
pH, UA: 6.5 (ref 5.0–8.0)

## 2020-08-20 NOTE — Patient Instructions (Signed)
Please call to schedule your bone density test  [facility]  [phone] [address], [place], [place] [address]       Preventing High Cholesterol Cholesterol is a white, waxy substance similar to fat that the human body needs to help build cells. The liver makes all the cholesterol that a person's body needs. Having high cholesterol (hypercholesterolemia) increases your risk for heart disease and stroke. Extra or excess cholesterolcomes from the food that you eat. High cholesterol can often be prevented with diet and lifestyle changes. If you already have high cholesterol, you can control it with diet, lifestyle changes,and medicines. How can high cholesterol affect me? If you have high cholesterol, fatty deposits (plaques) may build up on the walls of your blood vessels. The blood vessels that carry blood away from your heart are called arteries. Plaques make the arteries narrower and stiffer. This in turn can: Restrict or block blood flow and cause blood clots to form. Increase your risk for heart attack and stroke. What can increase my risk for high cholesterol? This condition is more likely to develop in people who: Eat foods that are high in saturated fat or cholesterol. Saturated fat is mostly found in foods that come from animal sources. Are overweight. Are not getting enough exercise. Have a family history of high cholesterol (familial hypercholesterolemia). What actions can I take to prevent this? Nutrition  Eat less saturated fat. Avoid trans fats (partially hydrogenated oils). These are often found in margarine and in some baked goods, fried foods, and snacks bought in packages. Avoid precooked or cured meat, such as bacon, sausages, or meat loaves. Avoid foods and drinks that have added sugars. Eat more fruits, vegetables, and whole grains. Choose healthy sources of protein, such as fish, poultry, lean cuts of red meat, beans,  peas, lentils, and nuts. Choose healthy sources of fat, such as: Nuts. Vegetable oils, especially olive oil. Fish that have healthy fats, such as omega-3 fatty acids. These fish include mackerel or salmon.  Lifestyle Lose weight if you are overweight. Maintaining a healthy body mass index (BMI) can help prevent or control high cholesterol. It can also lower your risk for diabetes and high blood pressure. Ask your health care provider to help you with a diet and exercise plan to lose weight safely. Do not use any products that contain nicotine or tobacco, such as cigarettes, e-cigarettes, and chewing tobacco. If you need help quitting, ask your health care provider. Alcohol use Do not drink alcohol if: Your health care provider tells you not to drink. You are pregnant, may be pregnant, or are planning to become pregnant. If you drink alcohol: Limit how much you use to: 0-1 drink a day for women. 0-2 drinks a day for men. Be aware of how much alcohol is in your drink. In the [place] one drink equals one 12 oz bottle of beer (355 mL), one 5 oz glass of wine (148 mL), or one 1 oz glass of hard liquor (44 mL). Activity  Get enough exercise. Do exercises as told by your health care provider. Each week, do at least 150 minutes of exercise that takes a medium level of effort (moderate-intensity exercise). This kind of exercise: Makes your heart beat faster while allowing you to still be able to talk. Can be done in short sessions several times a day or longer sessions a few times a week. For example, on 5 days each week, you could walk fast or  ride your bike 3 times a day for 10 minutes each time.  Medicines Your health care provider may recommend medicines to help lower cholesterol. This may be a medicine to lower the amount of cholesterol that your liver makes. You may need medicine if: Diet and lifestyle changes have not lowered your cholesterol enough. You have high cholesterol and other risk  factors for heart disease or stroke. Take over-the-counter and prescription medicines only as told by your health care provider. General information Manage your risk factors for high cholesterol. Talk with your health care provider about all your risk factors and how to lower your risk. Manage other conditions that you have, such as diabetes or high blood pressure (hypertension). Have blood tests to check your cholesterol levels at regular points in time as told by your health care provider. Keep all follow-up visits as told by your health care provider. This is important. Where to find more information American Heart Association: www.heart.org National Heart, Lung, and Blood Institute: https://wilson-eaton.com/ Summary High cholesterol increases your risk for heart disease and stroke. By keeping your cholesterol level low, you can reduce your risk for these conditions. High cholesterol can often be prevented with diet and lifestyle changes. Work with your health care provider to manage your risk factors, and have your blood tested regularly. This information is not intended to replace advice given to you by your health care provider. Make sure you discuss any questions you have with your healthcare provider. Document Revised: 11/28/2018 Document Reviewed: 11/28/2018 Elsevier Patient Education  [place].

## 2020-08-20 NOTE — Progress Notes (Signed)
Subjective:  Tarry Blayney is a 70 y.o. female who presents for Chief Complaint  Patient presents with   Medication Management    Wants to discuss frequent urination, cholesterol and thyroid level, split finger nails     Here for f/u  Last visit, was having lots of nausea, but after switching vitamins to a different time of day and not on empty stomach, things have resolved.  Here for recheck on cholesterol. Since the nausea resolved, has been very compliant with statin.    Wants to recheck on thyroid.  Having some night sweats, curious if thyroid is off.  The night sweats are not as bad as menopause, but worse than she feels she should have.  Having increased urine frequency, some urgency.  No burning with urination, no blood in urine, no fever.  Last UTI ages.  No incontinence.  No change in caffeine, but may have been eating some more sugar of late.   Always urinate before bedtime and gets up at least twice per night to urinate.    Sees gynecology/oncology in the next week, so is planning to talk to them about the urine issue as well.  Fingernails seem to have lines/ridges.    No other aggravating or relieving factors.    No other c/o.  The following portions of the patient's history were reviewed and updated as appropriate: allergies, current medications, past family history, past medical history, past social history, past surgical history and problem list.  ROS Otherwise as in subjective above  Objective: BP 120/84   Pulse 70   Ht 5' (1.524 m)   Wt 151 lb 3.2 oz (68.6 kg)   SpO2 95%   BMI 29.53 kg/m   General appearance: alert, no distress, well developed, well nourished Neck: supple, no lymphadenopathy, no thyromegaly, no masses Heart: RRR, normal S1, S2, no murmurs Lungs: CTA bilaterally, no wheezes, rhonchi, or rales Abdomen: +bs, soft, non tender, non distended, no masses, no hepatomegaly, no splenomegaly Pulses: 2+ radial pulses, 2+ pedal pulses, normal cap  refill Ext: no edema Skin: linear markings of fingernails, but no discoloration, no thickening   Assessment: Encounter Diagnoses  Name Primary?   Acquired hypothyroidism Yes   Nail problem    Osteopenia, unspecified location    Post-menopausal    S/P hysterectomy    History of endometrial cancer    Pure hypercholesterolemia    Urinary frequency    Vitamin D deficiency, unspecified     Nausea      Plan: Hypothyroidism - labs today, compliant with medication  Hyperlipidemia - compliant with statin, updated labs today, c/t low cholesterol diet  Urinary frequency - discussed possible causes.  UA today  Nail problem - discussed possible causes.  Labs as below  Nausea - thankfully has resolved   [place] was seen today for medication management.  Diagnoses and all orders for this visit:  Acquired hypothyroidism -     TSH  Nail problem -     VITAMIN D 25 Hydroxy (Vit-D Deficiency, Fractures)  Osteopenia, unspecified location -     VITAMIN D 25 Hydroxy (Vit-D Deficiency, Fractures)  Post-menopausal -     VITAMIN D 25 Hydroxy (Vit-D Deficiency, Fractures)  S/P hysterectomy  History of endometrial cancer  Pure hypercholesterolemia -     Lipid panel  Urinary frequency -     POCT Urinalysis DIP (Proadvantage Device)  Vitamin D deficiency, unspecified  -     VITAMIN D 25 Hydroxy (Vit-D Deficiency, Fractures)  Nausea  Follow up: pending labs

## 2020-08-21 ENCOUNTER — Other Ambulatory Visit: Payer: Self-pay | Admitting: Medical

## 2020-08-21 LAB — LIPID PANEL
Chol/HDL Ratio: 2.3 ratio (ref 0.0–4.4)
Cholesterol, Total: 206 mg/dL — ABNORMAL HIGH (ref 100–199)
HDL: 89 mg/dL (ref >39)
LDL Chol Calc (NIH): 101 mg/dL — ABNORMAL HIGH (ref 0–99)
Triglycerides: 90 mg/dL (ref 0–149)
VLDL Cholesterol Cal: 16 mg/dL (ref 5–40)

## 2020-08-21 LAB — TSH: TSH: 0.08 u[IU]/mL — ABNORMAL LOW (ref 0.450–4.500)

## 2020-08-21 LAB — VITAMIN D 25 HYDROXY (VIT D DEFICIENCY, FRACTURES): Vit D, 25-Hydroxy: 29.4 ng/mL — ABNORMAL LOW (ref 30.0–100.0)

## 2020-08-21 MED ORDER — LEVOTHYROXINE SODIUM 100 MCG PO TABS: 100.0000 ug | ORAL_TABLET | Freq: Every day | ORAL | 1 refills | Status: DC

## 2020-08-21 MED ORDER — VITAMIN D 25 MCG (1000 UNIT) PO TABS: 1000.0000 [IU] | ORAL_TABLET | Freq: Every day | ORAL | 3 refills | Status: DC

## 2020-08-21 NOTE — Progress Notes (Unsigned)
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  On 08/18/2018 she underwent robotic assisted total laparoscopic hysterectomy, bilateral salpingo-oophorectomy, bilateral sentinel lymph node dissection, and collection of pelvic washings.  Pathology revealed a tumor size of 4.5 cm, myometrial invasion 42% (8 of 19 mm), no LVSI.  Was performed at [facility] with Dr. Fleet Contras.  Patient then received adjuvant vaginal brachytherapy (vaginal cuff HDR, 4 fractions of 5.5 Gray, treated from 8/11-8/21/2020).   The patient was last seen for surveillance visit in [place] with Dr. Jake Bathe on 10/11.  At that time she underwent Pap test that was negative.   Interval History:   Saw CT surgery in April for ascending aortic aneurysm. Given stable size/findings on imaging, recommendation was continued surveillance with CTA chest and ECHO in 1 year.   Patient presents today for surveillance visit.  She fell at the end of January and unfortunately twisted her ankle and broke her shoulder.  She comes in in a brace today that is more for comfort when she is out and about.  She is in physical therapy.  Since her last visit with me, she also saw a cardiothoracic surgeon at [facility] and got a second opinion at [facility] in the setting of her aortic aneurysm.  Plan is for follow-up imaging to assess whether there is any change in size but given current size no surgery recommended.   She denies any vaginal bleeding or discharge.  She has not been using her vaginal dilator mostly in the setting of her injury.  She reports regular bowel and bladder function.  She endorses a good appetite without nausea or emesis.  She does note some nausea around the time of her injury which she thinks was related to pain  medication she received in the emergency department and on discharge.  Past Medical/Surgical History: Past Medical History:  Diagnosis Date   Asthma    mild, but prior on preventative FLovent   Endometrial cancer ([place]) 2020   Genital herpes    GERD (gastroesophageal reflux disease)    Hypothyroidism    Thoracic aortic aneurysm (HCC)    Wears hearing aid     Past Surgical History:  Procedure Laterality Date   COLONOSCOPY  2019   ROBOTIC ASSISTED TOTAL HYSTERECTOMY WITH BILATERAL SALPINGO OOPHERECTOMY     RA-TLH/BSO   TONSILLECTOMY AND ADENOIDECTOMY      Family History  Problem Relation Age of Onset   COPD Mother    Heart failure Mother    Macular degeneration Mother    Osteoporosis Mother    Cancer Mother        Mom needed a hysterectomy, thinks that she may have had uterine cancer   Heart disease Father        MI   COPD Father    Aortic aneurysm Father    Heart disease Brother        MI   Myelodysplastic syndrome Brother    Osteoporosis Maternal Grandmother    Heart failure Maternal Grandmother    Heart failure Maternal Grandfather    COPD Maternal Grandfather    Stroke Paternal Grandfather    Breast cancer Neg Hx    Colon cancer Neg Hx     Social History   Socioeconomic History   Marital status: Married    Spouse  name: Not on file   Number of children: Not on file   Years of education: Not on file   Highest education level: Not on file  Occupational History   Occupation: journalist - retired  Tobacco Use   Smoking status: Never   Smokeless tobacco: Never  Substance and Sexual Activity   Alcohol use: Yes    Alcohol/week: 6.0 standard drinks    Types: 2 Glasses of wine, 2 Cans of beer, 2 Shots of liquor per week   Drug use: Yes    Types: Marijuana    Comment: gummies   Sexual activity: Not Currently  Other Topics Concern   Not on file  Social History Narrative   Lives with husband and dog.  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 (Patient not taking: Reported on 08/20/2020), Disp: , Rfl:    amphetamine-dextroamphetamine (ADDERALL) 10 MG tablet, Take 1 tablet (10 mg total) by mouth daily with breakfast., Disp: 90 tablet, Rfl: 0   azelastine (ASTELIN) 0.1 % nasal spray, , Disp: , Rfl:    buPROPion (WELLBUTRIN XL) 150 MG 24 hr tablet, Take 150 mg by mouth daily., Disp: , Rfl:    cholecalciferol (VITAMIN D3) 25 MCG (1000 UNIT) tablet, Take 1 tablet (1,000 Units total) by mouth daily., Disp: 90 tablet, Rfl: 3   escitalopram (LEXAPRO) 10 MG tablet, Take 10 mg by mouth daily., Disp: , Rfl:    famotidine (PEPCID) 20 MG tablet, Take 1 tablet (20 mg total) by mouth at bedtime., Disp: 30 tablet, Rfl: 1   levothyroxine (SYNTHROID) 100 MCG tablet, Take 1 tablet (100 mcg total) by mouth daily., Disp: 90 tablet, Rfl: 1   Multiple Vitamins-Minerals (MULTI COMPLETE/IRON) TABS, Take by mouth. (Patient not taking: Reported on 08/20/2020), Disp: , Rfl:    ondansetron (ZOFRAN ODT) 4 MG disintegrating tablet, Take 1 tablet (4 mg total) by mouth every 8 (eight) hours as needed for nausea or vomiting. (Patient not taking: Reported on 08/20/2020), Disp: 20 tablet, Rfl: 0   pantoprazole (PROTONIX) 40 MG tablet, TAKE 1 TABLET EVERY DAY, Disp: 90 tablet, Rfl: 0   rosuvastatin (CRESTOR) 20 MG tablet, Take 1 tablet (20 mg total) by mouth daily., Disp: 90 tablet, Rfl: 3   valACYclovir (VALTREX) 500 MG tablet, Take 500 mg by mouth as needed., Disp: , Rfl:   Review of Systems: Denies appetite changes, fevers, chills, fatigue, unexplained weight changes. Denies hearing loss, neck lumps or masses, mouth  sores, ringing in ears or voice changes. Denies cough or wheezing.  Denies shortness of breath. Denies chest pain or palpitations. Denies leg swelling. Denies abdominal distention, pain, blood in stools, constipation, diarrhea, nausea, vomiting, or early satiety. Denies pain with intercourse, dysuria, frequency, hematuria or incontinence. Denies hot flashes, pelvic pain, vaginal bleeding or vaginal discharge.   Denies joint pain, back pain or muscle pain/cramps. Denies itching, rash, or wounds. Denies dizziness, headaches, numbness or seizures. Denies swollen lymph nodes or glands, denies easy bruising or bleeding. Denies anxiety, depression, confusion, or decreased concentration.  Physical Exam: There were no vitals taken for this visit. General: ***Alert, oriented, no acute distress.  HEENT: ***Posterior oropharynx clear, sclera anicteric. Chest: ***Clear to auscultation bilaterally.  ***Port site clean. Cardiovascular: ***Regular rate and rhythm, no murmurs. Abdomen: ***Obese, soft, nontender.  Normoactive bowel sounds.  No masses or hepatosplenomegaly appreciated.  ***Well-healed scar. Extremities: ***Grossly normal range of motion.  Warm, well perfused.  No edema bilaterally. Skin: ***No rashes or lesions noted. Lymphatics: ***No cervical, supraclavicular, or inguinal adenopathy. GU: Normal appearing external genitalia without erythema, excoriation, or lesions.  Speculum exam reveals ***.  Bimanual exam reveals ***.  ***Rectovaginal exam  confirms ___.  Laboratory & Radiologic Studies:   Assessment & Plan: Jamie Singh is a 70 y.o. 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   Patient continues to do well from a gynecologic cancer standpoint.  Unfortunately, she has had some agglutination again at the top of the vagina which I was unable to  fractionate on today's exam.  I think this is likely because she has not used her vaginal dilators since her last visit.  She is motivated to restart doing this and we discussed the importance again.  We had initially talked about transitioning to visits every 6 months, but I am going to plan to see her back in 3 months to see if with some dilator use I am able to fractionate any of the vaginal adhesions.   She will be approximately 2 years out from her diagnosis at her next visit.  We will plan to transition to visits every 6 months until she is 5 years out.  We reviewed signs and symptoms that would be concerning for disease recurrence, and she understands the importance of calling if she develops any of these before her next scheduled visit.  *** minutes of total time was spent for this patient encounter, including preparation, face-to-face counseling with the patient and coordination of care, and documentation of the encounter.  Jeral Pinch, MD  Division of Gynecologic Oncology  Department of Obstetrics and Gynecology  [facility]

## 2020-08-22 ENCOUNTER — Telehealth: Payer: Self-pay

## 2020-08-22 ENCOUNTER — Encounter: Payer: Self-pay | Admitting: Gynecologic Oncology

## 2020-08-22 ENCOUNTER — Ambulatory Visit: Payer: Medicare Other | Admitting: Gynecologic Oncology

## 2020-08-22 NOTE — Telephone Encounter (Signed)
Left message for Jamie Singh requesting a call back. We need to reschedule her appointment with Dr. Berline Lopes.

## 2020-08-23 ENCOUNTER — Other Ambulatory Visit: Payer: Self-pay | Admitting: Medical

## 2020-08-25 NOTE — Telephone Encounter (Signed)
Pt takes protonix in am and pepcid at night

## 2020-08-27 ENCOUNTER — Inpatient Hospital Stay: Payer: Medicare Other | Attending: Gynecologic Oncology | Admitting: Gynecologic Oncology

## 2020-08-27 ENCOUNTER — Encounter: Payer: Self-pay | Admitting: Gynecologic Oncology

## 2020-08-27 ENCOUNTER — Other Ambulatory Visit: Payer: Self-pay | Admitting: Gynecologic Oncology

## 2020-08-27 ENCOUNTER — Other Ambulatory Visit: Payer: Self-pay

## 2020-08-27 VITALS — BP 105/81 | HR 82 | Temp 98.8°F | Resp 16 | Ht 60.0 in | Wt 150.5 lb

## 2020-08-27 DIAGNOSIS — B009 Herpesviral infection, unspecified: Secondary | ICD-10-CM | POA: Diagnosis not present

## 2020-08-27 DIAGNOSIS — Z90710 Acquired absence of both cervix and uterus: Secondary | ICD-10-CM | POA: Diagnosis not present

## 2020-08-27 DIAGNOSIS — Z8542 Personal history of malignant neoplasm of other parts of uterus: Secondary | ICD-10-CM | POA: Diagnosis not present

## 2020-08-27 DIAGNOSIS — C541 Malignant neoplasm of endometrium: Secondary | ICD-10-CM | POA: Diagnosis not present

## 2020-08-27 DIAGNOSIS — Z923 Personal history of irradiation: Secondary | ICD-10-CM | POA: Diagnosis not present

## 2020-08-27 DIAGNOSIS — R350 Frequency of micturition: Secondary | ICD-10-CM | POA: Diagnosis not present

## 2020-08-27 DIAGNOSIS — I712 Thoracic aortic aneurysm, without rupture: Secondary | ICD-10-CM | POA: Diagnosis not present

## 2020-08-27 DIAGNOSIS — E039 Hypothyroidism, unspecified: Secondary | ICD-10-CM | POA: Insufficient documentation

## 2020-08-27 DIAGNOSIS — N952 Postmenopausal atrophic vaginitis: Secondary | ICD-10-CM

## 2020-08-27 DIAGNOSIS — Z90722 Acquired absence of ovaries, bilateral: Secondary | ICD-10-CM | POA: Insufficient documentation

## 2020-08-27 DIAGNOSIS — K219 Gastro-esophageal reflux disease without esophagitis: Secondary | ICD-10-CM | POA: Insufficient documentation

## 2020-08-27 DIAGNOSIS — Z79899 Other long term (current) drug therapy: Secondary | ICD-10-CM | POA: Insufficient documentation

## 2020-08-27 MED ORDER — ESTRADIOL 0.1 MG/GM VA CREA: 1.0000 | TOPICAL_CREAM | VAGINAL | 6 refills | Status: DC

## 2020-08-27 MED ORDER — ESTROGENS, CONJUGATED 0.625 MG/GM VA CREA: 1.0000 | TOPICAL_CREAM | VAGINAL | 1 refills | Status: DC

## 2020-08-27 NOTE — Patient Instructions (Signed)
It was good to see you today!  I do not see or feel any evidence of cancer recurrence on your exam.  Given the urinary symptoms that we discussed, I think doing a short trial of vaginal estrogen to see if this helps would be beneficial.  At your next visit in 3 months, if you have not noticed any difference, then we can discuss referral to urology.

## 2020-08-27 NOTE — Progress Notes (Signed)
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  On 08/18/2018 she underwent robotic assisted total laparoscopic hysterectomy, bilateral salpingo-oophorectomy, bilateral sentinel lymph node dissection, and collection of pelvic washings.  Pathology revealed a tumor size of 4.5 cm, myometrial invasion 42% (8 of 19 mm), no LVSI.  Was performed at [facility] with Dr. Fleet Contras.  Patient then received adjuvant vaginal brachytherapy (vaginal cuff HDR, 4 fractions of 5.5 Gray, treated from 8/11-8/21/2020).   The patient was last seen for surveillance visit in [place] with Dr. Jake Bathe on 10/11.  At that time she underwent Pap test that was negative.  Interval History: Patient presents today for surveillance visit.  She reports overall doing well.  Her shoulder has finally healed and she has been cleared for increased activity.  Unfortunately, she threw her back out yesterday while making the bed.  Other than several weeks when she was on vacation, she has been using her vaginal dilator regularly.  She notes intermittently having small amount of spotting with dilator use.  Otherwise she denies any vaginal bleeding or discharge.  She reports normal bowel function.  She continues to have urinary symptoms including increasing frequency, frequency at night, and feeling like she does not completely empty.  She often will have to void once or twice more after completing her initial stream.  She saw CT surgery in April for ascending aortic aneurysm. Given stable size/findings on imaging, recommendation was continued surveillance with CTA chest and ECHO in 1 year.   Past Medical/Surgical History: Past Medical History:  Diagnosis Date   Asthma    mild, but prior on preventative FLovent    Endometrial cancer ([place]) 2020   Genital herpes    GERD (gastroesophageal reflux disease)    Hypothyroidism    Thoracic aortic aneurysm (HCC)    Wears hearing aid     Past Surgical History:  Procedure Laterality Date   COLONOSCOPY  2019   ROBOTIC ASSISTED TOTAL HYSTERECTOMY WITH BILATERAL SALPINGO OOPHERECTOMY     RA-TLH/BSO   TONSILLECTOMY AND ADENOIDECTOMY      Family History  Problem Relation Age of Onset   COPD Mother    Heart failure Mother    Macular degeneration Mother    Osteoporosis Mother    Cancer Mother        Mom needed a hysterectomy, thinks that she may have had uterine cancer   Heart disease Father        MI   COPD Father    Aortic aneurysm Father    Heart disease Brother        MI   Myelodysplastic syndrome Brother    Osteoporosis Maternal Grandmother    Heart failure Maternal Grandmother    Heart failure Maternal Grandfather    COPD Maternal Grandfather    Stroke Paternal Grandfather    Breast cancer Neg Hx    Colon cancer Neg Hx     Social History   Socioeconomic History   Marital status: Married    Spouse name: Not on file   Number of children: Not on file   Years of education: Not on file   Highest education level: Not on file  Occupational History   Occupation: Environmental education officer - retired  Tobacco Use   Smoking status: Never   Smokeless tobacco: Never  Substance and Sexual  Activity   Alcohol use: Yes    Alcohol/week: 6.0 standard drinks    Types: 2 Glasses of wine, 2 Cans of beer, 2 Shots of liquor per week   Drug use: Yes    Types: Marijuana    Comment: gummies   Sexual activity: Not Currently  Other Topics Concern   Not on file  Social History Narrative   Lives with husband and dog.  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   Social Determinants of Health   Financial Resource Strain: Not on file  Food Insecurity: Not on file  Transportation Needs: Not on file  Physical Activity: Not on file  Stress: Not on file  Social  Connections: Not on file    Current Medications:  Current Outpatient Medications:    amphetamine-dextroamphetamine (ADDERALL) 10 MG tablet, Take 1 tablet (10 mg total) by mouth daily with breakfast., Disp: 90 tablet, Rfl: 0   buPROPion (WELLBUTRIN XL) 150 MG 24 hr tablet, Take 150 mg by mouth daily., Disp: , Rfl:    Calcium-Magnesium 250-125 MG TABS, Take 2 tablets by mouth daily., Disp: , Rfl:    cholecalciferol (VITAMIN D3) 25 MCG (1000 UNIT) tablet, Take 1 tablet (1,000 Units total) by mouth daily., Disp: 90 tablet, Rfl: 3   conjugated estrogens (PREMARIN) vaginal cream, Place 1 Applicatorful vaginally 3 (three) times a week. Place finger tip sized amount (not applicator) at outside part of the vagina 2-3 x a week, Disp: 42.5 g, Rfl: 1   escitalopram (LEXAPRO) 10 MG tablet, Take 10 mg by mouth daily., Disp: , Rfl:    levothyroxine (SYNTHROID) 100 MCG tablet, Take 1 tablet (100 mcg total) by mouth daily., Disp: 90 tablet, Rfl: 1   PSYLLIUM HUSK PO, Take 2,000 mg by mouth daily., Disp: , Rfl:    rosuvastatin (CRESTOR) 10 MG tablet, Take 10 mg by mouth daily., Disp: , Rfl:    albuterol (VENTOLIN HFA) 108 (90 Base) MCG/ACT inhaler, Inhale 2 puffs into the lungs every 6 (six) hours as needed for wheezing or shortness of breath. (Patient not taking: Reported on 08/22/2020), Disp: , Rfl:    famotidine (PEPCID) 20 MG tablet, TAKE 1 TABLET AT BEDTIME, Disp: 90 tablet, Rfl: 0   pantoprazole (PROTONIX) 40 MG tablet, Take 1 tablet (40 mg total) by mouth daily., Disp: 90 tablet, Rfl: 0   valACYclovir (VALTREX) 500 MG tablet, Take 500 mg by mouth as needed. (Patient not taking: Reported on 08/22/2020), Disp: , Rfl:   Review of Systems: Pertinent positives as described above Denies appetite changes, fevers, chills, fatigue, unexplained weight changes. Denies hearing loss, neck lumps or masses, mouth sores, ringing in ears or voice changes. Denies cough or wheezing.  Denies shortness of breath. Denies  chest pain or palpitations. Denies leg swelling. Denies abdominal distention, pain, blood in stools, constipation, diarrhea, nausea, vomiting, or early satiety. Denies pain with intercourse, dysuria, hematuria or incontinence. Denies hot flashes, pelvic pain, vaginal bleeding or vaginal discharge.   Denies joint pain, back pain or muscle pain/cramps. Denies itching, rash, or wounds. Denies dizziness, headaches, numbness or seizures. Denies swollen lymph nodes or glands, denies easy bruising or bleeding. Denies anxiety, depression, confusion, or decreased concentration.  Physical Exam: BP 105/81 (BP Location: Left Arm, Patient Position: Sitting)   Pulse 82   Temp 98.8 F (37.1 C) (Oral)   Resp 16   Ht 5' (1.524 m)   Wt 150 lb 8 oz (68.3 kg)   SpO2 98%   BMI 29.39 kg/m  General:  Alert, oriented, no acute distress. HEENT: Atraumatic, normocephalic, sclera anicteric. Chest: Unlabored breathing on room air. Cardiovascular: Regular rate and rhythm, no murmurs. Abdomen: soft, nontender.  Normoactive bowel sounds.  No masses or hepatosplenomegaly appreciated.  Well-healed incisions. Extremities: Grossly normal range of motion.  Warm, well perfused.  No edema bilaterally. Skin: No rashes or lesions noted. Lymphatics: No cervical, supraclavicular, or inguinal adenopathy. GU: Normal appearing external genitalia without erythema, excoriation, or lesions.  Speculum exam reveals foreshortened vagina, measuring approximately 5 cm.  Vaginal mucosa is quite atrophic and radiation changes noted.  No blood or discharge within the vaginal vault.  Bimanual exam reveals no agglutination that can be released on digital exam.  The cuff itself feels smooth, there is no nodularity or masses.  Rectovaginal exam confirms these findings.  Laboratory & Radiologic Studies: None new  Assessment & Plan: Jamie Singh is a 70 y.o. woman with Stage IA grade 3 endometrioid endometrial adenocarcinoma who completed  adjuvant vaginal brachytherapy in the setting of high intermediate risk features in August 2020.  The patient is now almost 2 years out from completion of adjuvant therapy and continues to be NED.  Her vagina is foreshortened and I did not feel any agglutination that could be fractionated.  I have encouraged her to continue using vaginal dilators regularly to prevent any further agglutination.  We also discussed the use of vaginal estrogen for several months to see if this helps with her urinary symptoms but also may help with dilator use.  Had initially planned to transition to visits every 6 months after this visit, but today we talked about a visit in 3 months to reassess her urinary symptoms after vaginal estrogen use and whether referral to urology is indicated.   We reviewed signs and symptoms that would be concerning for disease recurrence, and she understands the importance of calling if she develops any of these before her next scheduled visit.  32 minutes of total time was spent for this patient encounter, including preparation, face-to-face counseling with the patient and coordination of care, and documentation of the encounter.  Jeral Pinch, MD  Division of Gynecologic Oncology  Department of Obstetrics and Gynecology  [facility]

## 2020-09-03 ENCOUNTER — Other Ambulatory Visit: Payer: Self-pay | Admitting: Medical

## 2020-09-03 DIAGNOSIS — Z1231 Encounter for screening mammogram for malignant neoplasm of breast: Secondary | ICD-10-CM

## 2020-09-08 ENCOUNTER — Telehealth: Payer: Self-pay | Admitting: *Deleted

## 2020-09-08 NOTE — Telephone Encounter (Signed)
Patient called and moved her appt from 9/30 to 10/11

## 2020-09-16 ENCOUNTER — Other Ambulatory Visit: Payer: Self-pay | Admitting: Medical

## 2020-09-16 MED ORDER — ESCITALOPRAM OXALATE 10 MG PO TABS: 10.0000 mg | ORAL_TABLET | Freq: Every day | ORAL | 0 refills | Status: DC

## 2020-09-22 MED ORDER — BUPROPION HCL ER (XL) 150 MG PO TB24: 150.0000 mg | ORAL_TABLET | Freq: Every day | ORAL | 0 refills | Status: DC

## 2020-10-06 ENCOUNTER — Other Ambulatory Visit: Payer: Self-pay

## 2020-10-06 ENCOUNTER — Ambulatory Visit (INDEPENDENT_AMBULATORY_CARE_PROVIDER_SITE_OTHER): Payer: Medicare Other | Admitting: Medical

## 2020-10-06 VITALS — BP 110/70 | HR 72 | Wt 151.8 lb

## 2020-10-06 DIAGNOSIS — E039 Hypothyroidism, unspecified: Secondary | ICD-10-CM | POA: Diagnosis not present

## 2020-10-06 DIAGNOSIS — Z780 Asymptomatic menopausal state: Secondary | ICD-10-CM

## 2020-10-06 DIAGNOSIS — R61 Generalized hyperhidrosis: Secondary | ICD-10-CM

## 2020-10-06 DIAGNOSIS — R451 Restlessness and agitation: Secondary | ICD-10-CM | POA: Insufficient documentation

## 2020-10-06 DIAGNOSIS — R350 Frequency of micturition: Secondary | ICD-10-CM

## 2020-10-06 DIAGNOSIS — F902 Attention-deficit hyperactivity disorder, combined type: Secondary | ICD-10-CM | POA: Diagnosis not present

## 2020-10-06 MED ORDER — AMPHETAMINE-DEXTROAMPHETAMINE 10 MG PO TABS: 10.0000 mg | ORAL_TABLET | Freq: Every day | ORAL | 0 refills | Status: DC

## 2020-10-06 NOTE — Progress Notes (Signed)
Subjective:  Jamie Singh is a 70 y.o. female who presents for Chief Complaint  Patient presents with   Follow-up    Follow-up on medication change on levothyroxine. Needs refill on adderall to location pharmacy     Here for med check  Hypothyroidism-last visit we adjusted her dose given her lab findings.  She is doing fine on current medication.   Currently using Levothyroxine generic.    Has been sweating a lot, thinks its related to her Lexapro '10mg'$ .  This is an ongoing issues and has just learned to deal with the sweats.  Had stopped Wellbutrin and Lexapro earlier this year, and realized she was more emotional off the medication, overreacting to husband for example.  Went back on the medication at that time.     ADD-uses Adderall maybe once weekly.   Last refill was months ago.  Diagnosed 2008.  If she has a long distance drive, or lots of work that needs focus, will take the Adderall.    Otherwise doesn't take it regularly or daily.  When she was working full time, was quite organized.   No other medications for ADD prior that adderall.  When she takes the Adderal, sometimes gets dry mouth but no other side effects.    Last visit we discussed  urinary frequency and she talked to her oncology gynecologist recently about this.  They discussed the topical cream for atrophic changes but it was very expensive.  She notes that the problem with her urinary frequency is not so bad that she cannot deal with it.  Has occasional leakage but not bad.  No other aggravating or relieving factors.    No other c/o.  Past Medical History:  Diagnosis Date   Asthma    mild, but prior on preventative FLovent   Endometrial cancer ([place]) 2020   Genital herpes    GERD (gastroesophageal reflux disease)    Hypothyroidism    Thoracic aortic aneurysm (HCC)    Wears hearing aid    Current Outpatient Medications on File Prior to Visit  Medication Sig Dispense Refill   buPROPion (WELLBUTRIN XL) 150 MG 24 hr  tablet Take 1 tablet (150 mg total) by mouth daily. 90 tablet 0   Calcium-Magnesium 250-125 MG TABS Take 2 tablets by mouth daily.     cholecalciferol (VITAMIN D3) 25 MCG (1000 UNIT) tablet Take 1 tablet (1,000 Units total) by mouth daily. 90 tablet 3   escitalopram (LEXAPRO) 10 MG tablet Take 1 tablet (10 mg total) by mouth daily. 90 tablet 0   famotidine (PEPCID) 20 MG tablet TAKE 1 TABLET AT BEDTIME 90 tablet 0   levothyroxine (SYNTHROID) 100 MCG tablet Take 1 tablet (100 mcg total) by mouth daily. 90 tablet 1   pantoprazole (PROTONIX) 40 MG tablet Take 1 tablet (40 mg total) by mouth daily. 90 tablet 0   PSYLLIUM HUSK PO Take 2,000 mg by mouth daily.     rosuvastatin (CRESTOR) 10 MG tablet Take 10 mg by mouth daily.     valACYclovir (VALTREX) 500 MG tablet Take 500 mg by mouth as needed.     albuterol (VENTOLIN HFA) 108 (90 Base) MCG/ACT inhaler Inhale 2 puffs into the lungs every 6 (six) hours as needed for wheezing or shortness of breath. (Patient not taking: Reported on 10/06/2020)     No current facility-administered medications on file prior to visit.   The following portions of the patient's history were reviewed and updated as appropriate: allergies, current medications, past family  history, past medical history, past social history, past surgical history and problem list.  ROS Otherwise as in subjective above    Objective: BP 110/70   Pulse 72   Wt 151 lb 12.8 oz (68.9 kg)   BMI 29.65 kg/m   General appearance: alert, no distress, well developed, well nourished Otherwise not examined    Assessment: Encounter Diagnoses  Name Primary?   Acquired hypothyroidism Yes   Attention deficit hyperactivity disorder (ADHD), combined type    Urinary frequency    Hyperhidrosis    Post-menopausal    Agitation      Plan: Hypothyroidism-labs today to follow-up from recent visit for medicine adjustment.  She currently is on levothyroxine instead of namebrand Synthroid.  She is  aware of proper use  ADD-continue Adderall as needed.  She uses on average once per week.  We discussed risk and benefits of medication  Urinary frequency-likely overactive bladder.  We discussed possible treatments.  She will consider but refrains from medication at this time  Hyperhidrosis-we discussed possible causes.  We discussed possible treatments.  She does not want to add any other medicine at this time.  Post menopausal, agitation - seems to be doing ok on current therapy with wellbutrin and lexapro.  We discussed possible trial of higher dose wellbutrin and weaning down on lexapro given sweats.  She will consider but she notes when coming off both medicaiton earlier this year, she didn't feel well off the medication.  Laelani was seen today for follow-up.  Diagnoses and all orders for this visit:  Acquired hypothyroidism -     TSH + free T4  Attention deficit hyperactivity disorder (ADHD), combined type  Urinary frequency  Hyperhidrosis  Post-menopausal  Agitation  Other orders -     amphetamine-dextroamphetamine (ADDERALL) 10 MG tablet; Take 1 tablet (10 mg total) by mouth daily with breakfast.   Follow up: pending labs

## 2020-10-07 LAB — TSH+FREE T4
Free T4: 1.6 ng/dL (ref 0.82–1.77)
TSH: 0.14 u[IU]/mL — ABNORMAL LOW (ref 0.450–4.500)

## 2020-11-12 ENCOUNTER — Other Ambulatory Visit: Payer: Self-pay

## 2020-11-12 ENCOUNTER — Ambulatory Visit
Admission: RE | Admit: 2020-11-12 | Discharge: 2020-11-12 | Disposition: A | Discharge status: Home / Self Care | Payer: Medicare Other | Source: Ambulatory Visit | Attending: Medical | Admitting: Medical

## 2020-11-12 DIAGNOSIS — E2839 Other primary ovarian failure: Secondary | ICD-10-CM

## 2020-11-12 DIAGNOSIS — M8580 Other specified disorders of bone density and structure, unspecified site: Secondary | ICD-10-CM

## 2020-11-12 DIAGNOSIS — Z780 Asymptomatic menopausal state: Secondary | ICD-10-CM

## 2020-11-18 ENCOUNTER — Telehealth: Payer: Self-pay | Admitting: *Deleted

## 2020-11-18 NOTE — Telephone Encounter (Signed)
Called and moved the patient's appt from 10/11 to 10/10

## 2020-11-21 ENCOUNTER — Other Ambulatory Visit: Payer: Self-pay

## 2020-11-21 ENCOUNTER — Ambulatory Visit
Admission: RE | Admit: 2020-11-21 | Discharge: 2020-11-21 | Disposition: A | Discharge status: Home / Self Care | Payer: Medicare Other | Source: Ambulatory Visit | Attending: Medical | Admitting: Medical

## 2020-11-21 DIAGNOSIS — Z1231 Encounter for screening mammogram for malignant neoplasm of breast: Secondary | ICD-10-CM

## 2020-11-28 ENCOUNTER — Ambulatory Visit: Payer: Medicare Other | Admitting: Gynecologic Oncology

## 2020-11-29 ENCOUNTER — Other Ambulatory Visit: Payer: Self-pay | Admitting: Medical

## 2020-12-07 NOTE — Progress Notes (Signed)
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  On 08/18/2018 she underwent robotic assisted total laparoscopic hysterectomy, bilateral salpingo-oophorectomy, bilateral sentinel lymph node dissection, and collection of pelvic washings.  Pathology revealed a tumor size of 4.5 cm, myometrial invasion 42% (8 of 19 mm), no LVSI.  Was performed at [facility] with Dr. Fleet Contras.  Patient then received adjuvant vaginal brachytherapy (vaginal cuff HDR, 4 fractions of 5.5 Gray, treated from 8/11-8/21/2020).  The patient was last seen for surveillance visit in [place] with Dr. Jake Bathe on 10/11.  At that time she underwent Pap test that was negative.  Interval History: Patient presents today for surveillance.  She notes overall doing well.  She just returned from a trip to [place] for a wedding and then to [place].  She denies any vaginal discharge.  She has been working on using her dilator more frequently and occasionally has small amount of spotting when she uses it.  Otherwise denies vaginal bleeding.  She ended up not using the vaginal estrogen that we had discussed at her last visit secondary to the cost.  She feels that she has been able to improve her nocturia with decreasing her fluid intake after 8 PM.  She now gets up twice a night to urinate.  She denies any significant abdominal or pelvic pain.  She reports regular bowel function with the use of daily fiber.  Past Medical/Surgical History: Past Medical History:  Diagnosis Date   Asthma    mild, but prior on preventative FLovent   Endometrial cancer ([place]) 2020   Genital herpes    GERD (gastroesophageal reflux disease)    Hypothyroidism    Thoracic aortic aneurysm    Wears hearing aid     Past Surgical History:   Procedure Laterality Date   COLONOSCOPY  2019   ROBOTIC ASSISTED TOTAL HYSTERECTOMY WITH BILATERAL SALPINGO OOPHERECTOMY     [id number]   TONSILLECTOMY AND ADENOIDECTOMY      Family History  Problem Relation Age of Onset   COPD Mother    Heart failure Mother    Macular degeneration Mother    Osteoporosis Mother    Cancer Mother        Mom needed a hysterectomy, thinks that she may have had uterine cancer   Heart disease Father        MI   COPD Father    Aortic aneurysm Father    Heart disease Brother        MI   Myelodysplastic syndrome Brother    Osteoporosis Maternal Grandmother    Heart failure Maternal Grandmother    Heart failure Maternal Grandfather    COPD Maternal Grandfather    Stroke Paternal Grandfather    Breast cancer Neg Hx    Colon cancer Neg Hx     Social History   Socioeconomic History   Marital status: Married    Spouse name: Not on file   Number of children: Not on file   Years of education: Not on file   Highest education level: Not on file  Occupational History   Occupation: Environmental education officer - retired  Tobacco Use   Smoking status: Never   Smokeless tobacco: Never  Substance and Sexual Activity   Alcohol use: Yes    Alcohol/week: 6.0 standard drinks    Types: 2 Glasses  of wine, 2 Cans of beer, 2 Shots of liquor per week   Drug use: Yes    Types: Marijuana    Comment: gummies   Sexual activity: Not Currently  Other Topics Concern   Not on file  Social History Narrative   Lives with husband and dog.  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   Social Determinants of Health   Financial Resource Strain: Not on file  Food Insecurity: Not on file  Transportation Needs: Not on file  Physical Activity: Not on file  Stress: Not on file  Social Connections: Not on file    Current Medications:  Current Outpatient Medications:    amphetamine-dextroamphetamine (ADDERALL) 10 MG tablet, Take 1 tablet (10 mg total) by mouth daily with breakfast.,  Disp: 90 tablet, Rfl: 0   buPROPion (WELLBUTRIN XL) 150 MG 24 hr tablet, TAKE 1 TABLET EVERY DAY, Disp: 90 tablet, Rfl: 0   Calcium-Magnesium 250-125 MG TABS, Take 2 tablets by mouth daily., Disp: , Rfl:    cholecalciferol (VITAMIN D3) 25 MCG (1000 UNIT) tablet, Take 1 tablet (1,000 Units total) by mouth daily., Disp: 90 tablet, Rfl: 3   escitalopram (LEXAPRO) 10 MG tablet, Take 1 tablet (10 mg total) by mouth daily., Disp: 90 tablet, Rfl: 0   famotidine (PEPCID) 20 MG tablet, TAKE 1 TABLET AT BEDTIME, Disp: 90 tablet, Rfl: 0   pantoprazole (PROTONIX) 40 MG tablet, Take 1 tablet (40 mg total) by mouth daily., Disp: 90 tablet, Rfl: 0   PSYLLIUM HUSK PO, Take 2,000 mg by mouth daily., Disp: , Rfl:    rosuvastatin (CRESTOR) 10 MG tablet, Take 10 mg by mouth daily., Disp: , Rfl:    SYNTHROID 125 MCG tablet, , Disp: , Rfl:    valACYclovir (VALTREX) 500 MG tablet, Take 500 mg by mouth as needed., Disp: , Rfl:    albuterol (VENTOLIN HFA) 108 (90 Base) MCG/ACT inhaler, Inhale 2 puffs into the lungs every 6 (six) hours as needed for wheezing or shortness of breath. (Patient not taking: No sig reported), Disp: , Rfl:   Review of Systems: Denies appetite changes, fevers, chills, fatigue, unexplained weight changes. Denies hearing loss, neck lumps or masses, mouth sores, ringing in ears or voice changes. Denies cough or wheezing.  Denies shortness of breath. Denies chest pain or palpitations. Denies leg swelling. Denies abdominal distention, pain, blood in stools, constipation, diarrhea, nausea, vomiting, or early satiety. Denies pain with intercourse, dysuria, frequency, hematuria or incontinence. Denies hot flashes, pelvic pain, vaginal bleeding or vaginal discharge.   Denies joint pain, back pain or muscle pain/cramps. Denies itching, rash, or wounds. Denies dizziness, headaches, numbness or seizures. Denies swollen lymph nodes or glands, denies easy bruising or bleeding. Denies anxiety, depression,  confusion, or decreased concentration.  Physical Exam: BP 121/85 (BP Location: Left Arm, Patient Position: Sitting)   Pulse 85   Temp 98.8 F (37.1 C) (Oral)   Resp 16   Ht 5' (1.524 m)   Wt 152 lb (68.9 kg)   SpO2 98%   BMI 29.69 kg/m  General: Alert, oriented, no acute distress. HEENT: Normocephalic, atraumatic, sclera anicteric. Chest: Clear to auscultation bilaterally.  Labored breathing on room air. Cardiovascular: Regular rate and rhythm, no murmurs. Abdomen: soft, nontender.  Normoactive bowel sounds.  No masses or hepatosplenomegaly appreciated.  Well-healed incisions. Extremities: Grossly normal range of motion.  Warm, well perfused.  No edema bilaterally. Skin: No rashes or lesions noted. Lymphatics: No cervical, supraclavicular, or inguinal adenopathy. GU: Normal appearing external  genitalia without erythema, excoriation, or lesions.  Speculum exam reveals foreshortened vagina, measuring approximately 5 cm.  Vaginal mucosa is quite atrophic and radiation changes noted.  No blood or discharge within the vaginal vault.  Bimanual exam reveals no agglutination that can be released on digital exam.  The cuff itself feels smooth, there is no nodularity or masses.  Rectovaginal exam confirms these findings.  Laboratory & Radiologic Studies: None new  Assessment & Plan: TASHAUNA CAISSE is a 70 y.o. 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   Patient presents today for surveillance visit.  She continues to be NED.  She has been using her dilator and continues to have foreshortening of her vagina.  Given cost, she did not use vaginal estrogen as we had discussed at her last visit.  I offered different formulation, but she would like to proceed without estrogen use at this time which I think is reasonable.  She will call me if her urinary symptoms  worsen.  Since she is more than 2 years out from completion of adjuvant treatment, we will transition to visits every 6 months.  We reviewed signs and symptoms that would be concerning for disease recurrence and she knows to call if she develops any of these before her neck scheduled visit.  Otherwise I will see her in April of next year.  34 minutes of total time was spent for this patient encounter, including preparation, face-to-face counseling with the patient and coordination of care, and documentation of the encounter.  Jeral Pinch, MD  Division of Gynecologic Oncology  Department of Obstetrics and Gynecology  [facility]

## 2020-12-08 ENCOUNTER — Inpatient Hospital Stay: Payer: Medicare Other | Attending: Gynecologic Oncology | Admitting: Gynecologic Oncology

## 2020-12-08 ENCOUNTER — Other Ambulatory Visit: Payer: Self-pay

## 2020-12-08 ENCOUNTER — Encounter: Payer: Self-pay | Admitting: Gynecologic Oncology

## 2020-12-08 VITALS — BP 121/85 | HR 85 | Temp 98.8°F | Resp 16 | Ht 60.0 in | Wt 152.0 lb

## 2020-12-08 DIAGNOSIS — Z90710 Acquired absence of both cervix and uterus: Secondary | ICD-10-CM | POA: Insufficient documentation

## 2020-12-08 DIAGNOSIS — Z90722 Acquired absence of ovaries, bilateral: Secondary | ICD-10-CM | POA: Diagnosis not present

## 2020-12-08 DIAGNOSIS — Z923 Personal history of irradiation: Secondary | ICD-10-CM | POA: Insufficient documentation

## 2020-12-08 DIAGNOSIS — Z8542 Personal history of malignant neoplasm of other parts of uterus: Secondary | ICD-10-CM | POA: Diagnosis present

## 2020-12-08 DIAGNOSIS — N952 Postmenopausal atrophic vaginitis: Secondary | ICD-10-CM

## 2020-12-08 DIAGNOSIS — C541 Malignant neoplasm of endometrium: Secondary | ICD-10-CM

## 2020-12-08 NOTE — Patient Instructions (Signed)
It was great to see you today!  I do not see or feel any evidence of cancer recurrence on your exam.  If you develop any new/concerning symptoms before your next visit, please call to come see me sooner.  Otherwise, we will transition to visits every 6 months.  Please call sometime after the beginning of the year to get a visit scheduled with me in mid April.

## 2020-12-09 ENCOUNTER — Inpatient Hospital Stay: Payer: Medicare Other | Admitting: Gynecologic Oncology

## 2020-12-09 ENCOUNTER — Ambulatory Visit (INDEPENDENT_AMBULATORY_CARE_PROVIDER_SITE_OTHER): Payer: Medicare Other | Admitting: Medical

## 2020-12-09 VITALS — BP 110/80 | HR 80 | Wt 151.0 lb

## 2020-12-09 DIAGNOSIS — M5432 Sciatica, left side: Secondary | ICD-10-CM | POA: Insufficient documentation

## 2020-12-09 DIAGNOSIS — M5442 Lumbago with sciatica, left side: Secondary | ICD-10-CM | POA: Diagnosis not present

## 2020-12-09 DIAGNOSIS — M7918 Myalgia, other site: Secondary | ICD-10-CM | POA: Insufficient documentation

## 2020-12-09 MED ORDER — MELOXICAM 15 MG PO TABS: 15.0000 mg | ORAL_TABLET | Freq: Every day | ORAL | 0 refills | Status: DC

## 2020-12-09 MED ORDER — TIZANIDINE HCL 4 MG PO TABS: 4.0000 mg | ORAL_TABLET | Freq: Two times a day (BID) | ORAL | 0 refills | Status: DC | PRN

## 2020-12-09 NOTE — Patient Instructions (Signed)
Recommendations Begin meloxicam anti-inflammatory one half or 1 tablet daily for the next 10 to 14 days for pain and inflammation Avoid over-the-counter anti-inflammatories while on this medication If any medications prescribed are too expensive or not covered by insurance let me know Begin tizanidine muscle relaxer 1-2 times daily for spasm and tightness of the muscle.  This is an as needed medication for muscle tightness and spasm.  This medicine can cause drowsiness. Stretch daily, use heat for muscle tightness Consider physical therapy or massage therapy If desired we can refer to physical therapy Use a wedge pillow or pillow under her legs during sleeping for the next several days Cut back on activities that seem to worsen the symptoms short-term    Sciatica Sciatica is pain, weakness, tingling, or loss of feeling (numbness) along the sciatic nerve. The sciatic nerve starts in the lower back and goes down the back of each leg. Sciatica usually goes away on its own or with treatment. Sometimes, sciatica may come back (recur). What are the causes? This condition happens when the sciatic nerve is pinched or has pressure put on it. This may be the result of: A disk in between the bones of the spine bulging out too far (herniated disk). Changes in the spinal disks that occur with aging. A condition that affects a muscle in the butt. Extra bone growth near the sciatic nerve. A break (fracture) of the area between your hip bones (pelvis). Pregnancy. Tumor. This is rare. What increases the risk? You are more likely to develop this condition if you: Play sports that put pressure or stress on the spine. Have poor strength and ease of movement (flexibility). Have had a back injury in the past. Have had back surgery. Sit for long periods of time. Do activities that involve bending or lifting over and over again. Are very overweight (obese). What are the signs or symptoms? Symptoms can vary  from mild to very bad. They may include: Any of these problems in the lower back, leg, hip, or butt: Mild tingling, loss of feeling, or dull aches. Burning sensations. Sharp pains. Loss of feeling in the back of the calf or the sole of the foot. Leg weakness. Very bad back pain that makes it hard to move. These symptoms may get worse when you cough, sneeze, or laugh. They may also get worse when you sit or stand for long periods of time. How is this treated? This condition often gets better without any treatment. However, treatment may include: Changing or cutting back on physical activity when you have pain. Doing exercises and stretching. Putting ice or heat on the affected area. Medicines that help: To relieve pain and swelling. To relax your muscles. Shots (injections) of medicines that help to relieve pain, irritation, and swelling. Surgery. Follow these instructions at home: Medicines Take over-the-counter and prescription medicines only as told by your doctor. Ask your doctor if the medicine prescribed to you: Requires you to avoid driving or using heavy machinery. Can cause trouble pooping (constipation). You may need to take these steps to prevent or treat trouble pooping: Drink enough fluids to keep your pee (urine) pale yellow. Take over-the-counter or prescription medicines. Eat foods that are high in fiber. These include beans, whole grains, and fresh fruits and vegetables. Limit foods that are high in fat and sugar. These include fried or sweet foods. Managing pain   If told, put ice on the affected area. Put ice in a plastic bag. Place a towel between  your skin and the bag. Leave the ice on for 20 minutes, 2-3 times a day. If told, put heat on the affected area. Use the heat source that your doctor tells you to use, such as a moist heat pack or a heating pad. Place a towel between your skin and the heat source. Leave the heat on for 20-30 minutes. Remove the heat  if your skin turns bright red. This is very important if you are unable to feel pain, heat, or cold. You may have a greater risk of getting burned. Activity  Return to your normal activities as told by your doctor. Ask your doctor what activities are safe for you. Avoid activities that make your symptoms worse. Take short rests during the day. When you rest for a long time, do some physical activity or stretching between periods of rest. Avoid sitting for a long time without moving. Get up and move around at least one time each hour. Exercise and stretch regularly, as told by your doctor. Do not lift anything that is heavier than 10 lb (4.5 kg) while you have symptoms of sciatica. Avoid lifting heavy things even when you do not have symptoms. Avoid lifting heavy things over and over. When you lift objects, always lift in a way that is safe for your body. To do this, you should: Bend your knees. Keep the object close to your body. Avoid twisting. General instructions Stay at a healthy weight. Wear comfortable shoes that support your feet. Avoid wearing high heels. Avoid sleeping on a mattress that is too soft or too hard. You might have less pain if you sleep on a mattress that is firm enough to support your back. Keep all follow-up visits as told by your doctor. This is important. Contact a doctor if: You have pain that: Wakes you up when you are sleeping. Gets worse when you lie down. Is worse than the pain you have had in the past. Lasts longer than 4 weeks. You lose weight without trying. Get help right away if: You cannot control when you pee (urinate) or poop (have a bowel movement). You have weakness in any of these areas and it gets worse: Lower back. The area between your hip bones. Butt. Legs. You have redness or swelling of your back. You have a burning feeling when you pee. Summary Sciatica is pain, weakness, tingling, or loss of feeling (numbness) along the sciatic  nerve. This condition happens when the sciatic nerve is pinched or has pressure put on it. Sciatica can cause pain, tingling, or loss of feeling (numbness) in the lower back, legs, hips, and butt. Treatment often includes rest, exercise, medicines, and putting ice or heat on the affected area. This information is not intended to replace advice given to you by your health care provider. Make sure you discuss any questions you have with your health care provider. Document Revised: 03/06/2018 Document Reviewed: 03/06/2018 Elsevier Patient Education  [place].

## 2020-12-09 NOTE — Progress Notes (Signed)
Subjective:  Jamie Singh is a 70 y.o. female who presents for Chief Complaint  Patient presents with   pain in buttocks    Started working out and noticed the pain x 3 weeks ago     Here for pain in buttocks.  Went back to boot camp exercise program about 6 weeks ago. The first weeks she pulled a muscle in right buttock.   3 weeks ago left side started hurting.  Concerned about sciatica nerve issue.  Was in [place] last week and walked a lot.   She found her self pushing her hand against her left buttock while walking around [place].  Thinks she inflamed this either swinging kettle bells or running on hard pavement.  No fall, no trauma.  Pain goes from buttock to posterior thigh, not any further down leg . Has some left lumbar pain.  No particular spasm of muscle.  No numbness, tingling or weakness in leg.  Yesterday walking the dog, felt some stabbing pain in left low back.  No incontinence of stool or urine.  No fever.  No belly pain.   Using ibuprofen for symptoms  No other aggravating or relieving factors.    No other c/o.  Past Medical History:  Diagnosis Date   Asthma    mild, but prior on preventative FLovent   Endometrial cancer ([place]) 2020   Genital herpes    GERD (gastroesophageal reflux disease)    Hypothyroidism    Thoracic aortic aneurysm    Wears hearing aid    Current Outpatient Medications on File Prior to Visit  Medication Sig Dispense Refill   albuterol (VENTOLIN HFA) 108 (90 Base) MCG/ACT inhaler Inhale 2 puffs into the lungs every 6 (six) hours as needed for wheezing or shortness of breath.     amphetamine-dextroamphetamine (ADDERALL) 10 MG tablet Take 1 tablet (10 mg total) by mouth daily with breakfast. 90 tablet 0   buPROPion (WELLBUTRIN XL) 150 MG 24 hr tablet TAKE 1 TABLET EVERY DAY 90 tablet 0   Calcium-Magnesium 250-125 MG TABS Take 2 tablets by mouth daily.     cholecalciferol (VITAMIN D3) 25 MCG (1000 UNIT) tablet Take 1 tablet (1,000 Units total) by  mouth daily. 90 tablet 3   escitalopram (LEXAPRO) 10 MG tablet Take 1 tablet (10 mg total) by mouth daily. 90 tablet 0   famotidine (PEPCID) 20 MG tablet TAKE 1 TABLET AT BEDTIME 90 tablet 0   pantoprazole (PROTONIX) 40 MG tablet Take 1 tablet (40 mg total) by mouth daily. 90 tablet 0   PSYLLIUM HUSK PO Take 2,000 mg by mouth daily.     rosuvastatin (CRESTOR) 10 MG tablet Take 10 mg by mouth daily.     SYNTHROID 125 MCG tablet      valACYclovir (VALTREX) 500 MG tablet Take 500 mg by mouth as needed.     No current facility-administered medications on file prior to visit.     The following portions of the patient's history were reviewed and updated as appropriate: allergies, current medications, past family history, past medical history, past social history, past surgical history and problem list.  ROS Otherwise as in subjective above   Objective: BP 110/80   Pulse 80   Wt 151 lb (68.5 kg)   BMI 29.49 kg/m   General appearance: alert, no distress, well developed, well nourished Mild tenderness in left sciatic notch of the buttock, otherwise low back and buttocks nontender, legs nontender, hips nontender normal range of motion, no obvious swelling  or deformity of legs, back range of motion full although she has slight pain noted with full flexion on the left lower back Normal heel and toe walk, normal leg strength and sensation, leg DTRs 1+ bilateral Pulses: 2+ radial pulses, 2+ pedal pulses, normal cap refill Ext: no edema   Assessment: Encounter Diagnoses  Name Primary?   Sciatica of left side Yes   Buttock pain    Acute left-sided low back pain with left-sided sciatica      Plan: We discussed symptoms and concerns suggestive of sciatic flare.  I believe this is a combination of her recent [employer] exercises including kettle bell lunges but also the fact that she walks around a big city over the weekend probably aggravated things even more.  This will likely be a  short-term issue.  Discussed the following recommendations which was also given a handout.  Discussed medications, proper use of medications and risk and benefits of medications  Patient Instructions  Recommendations Begin meloxicam anti-inflammatory one half or 1 tablet daily for the next 10 to 14 days for pain and inflammation Avoid over-the-counter anti-inflammatories while on this medication If any medications prescribed are too expensive or not covered by insurance let me know Begin tizanidine muscle relaxer 1-2 times daily for spasm and tightness of the muscle.  This is an as needed medication for muscle tightness and spasm.  This medicine can cause drowsiness. Stretch daily, use heat for muscle tightness Consider physical therapy or massage therapy If desired we can refer to physical therapy Use a wedge pillow or pillow under her legs during sleeping for the next several days Cut back on activities that seem to worsen the symptoms short-term    Sciatica Sciatica is pain, weakness, tingling, or loss of feeling (numbness) along the sciatic nerve. The sciatic nerve starts in the lower back and goes down the back of each leg. Sciatica usually goes away on its own or with treatment. Sometimes, sciatica may come back (recur). What are the causes? This condition happens when the sciatic nerve is pinched or has pressure put on it. This may be the result of: A disk in between the bones of the spine bulging out too far (herniated disk). Changes in the spinal disks that occur with aging. A condition that affects a muscle in the butt. Extra bone growth near the sciatic nerve. A break (fracture) of the area between your hip bones (pelvis). Pregnancy. Tumor. This is rare. What increases the risk? You are more likely to develop this condition if you: Play sports that put pressure or stress on the spine. Have poor strength and ease of movement (flexibility). Have had a back injury in the  past. Have had back surgery. Sit for long periods of time. Do activities that involve bending or lifting over and over again. Are very overweight (obese). What are the signs or symptoms? Symptoms can vary from mild to very bad. They may include: Any of these problems in the lower back, leg, hip, or butt: Mild tingling, loss of feeling, or dull aches. Burning sensations. Sharp pains. Loss of feeling in the back of the calf or the sole of the foot. Leg weakness. Very bad back pain that makes it hard to move. These symptoms may get worse when you cough, sneeze, or laugh. They may also get worse when you sit or stand for long periods of time. How is this treated? This condition often gets better without any treatment. However, treatment may include: Changing or  cutting back on physical activity when you have pain. Doing exercises and stretching. Putting ice or heat on the affected area. Medicines that help: To relieve pain and swelling. To relax your muscles. Shots (injections) of medicines that help to relieve pain, irritation, and swelling. Surgery. Follow these instructions at home: Medicines Take over-the-counter and prescription medicines only as told by your doctor. Ask your doctor if the medicine prescribed to you: Requires you to avoid driving or using heavy machinery. Can cause trouble pooping (constipation). You may need to take these steps to prevent or treat trouble pooping: Drink enough fluids to keep your pee (urine) pale yellow. Take over-the-counter or prescription medicines. Eat foods that are high in fiber. These include beans, whole grains, and fresh fruits and vegetables. Limit foods that are high in fat and sugar. These include fried or sweet foods. Managing pain   If told, put ice on the affected area. Put ice in a plastic bag. Place a towel between your skin and the bag. Leave the ice on for 20 minutes, 2-3 times a day. If told, put heat on the affected  area. Use the heat source that your doctor tells you to use, such as a moist heat pack or a heating pad. Place a towel between your skin and the heat source. Leave the heat on for 20-30 minutes. Remove the heat if your skin turns bright red. This is very important if you are unable to feel pain, heat, or cold. You may have a greater risk of getting burned. Activity  Return to your normal activities as told by your doctor. Ask your doctor what activities are safe for you. Avoid activities that make your symptoms worse. Take short rests during the day. When you rest for a long time, do some physical activity or stretching between periods of rest. Avoid sitting for a long time without moving. Get up and move around at least one time each hour. Exercise and stretch regularly, as told by your doctor. Do not lift anything that is heavier than 10 lb (4.5 kg) while you have symptoms of sciatica. Avoid lifting heavy things even when you do not have symptoms. Avoid lifting heavy things over and over. When you lift objects, always lift in a way that is safe for your body. To do this, you should: Bend your knees. Keep the object close to your body. Avoid twisting. General instructions Stay at a healthy weight. Wear comfortable shoes that support your feet. Avoid wearing high heels. Avoid sleeping on a mattress that is too soft or too hard. You might have less pain if you sleep on a mattress that is firm enough to support your back. Keep all follow-up visits as told by your doctor. This is important. Contact a doctor if: You have pain that: Wakes you up when you are sleeping. Gets worse when you lie down. Is worse than the pain you have had in the past. Lasts longer than 4 weeks. You lose weight without trying. Get help right away if: You cannot control when you pee (urinate) or poop (have a bowel movement). You have weakness in any of these areas and it gets worse: Lower back. The area between  your hip bones. Butt. Legs. You have redness or swelling of your back. You have a burning feeling when you pee. Summary Sciatica is pain, weakness, tingling, or loss of feeling (numbness) along the sciatic nerve. This condition happens when the sciatic nerve is pinched or has pressure put on it.  Sciatica can cause pain, tingling, or loss of feeling (numbness) in the lower back, legs, hips, and butt. Treatment often includes rest, exercise, medicines, and putting ice or heat on the affected area. This information is not intended to replace advice given to you by your health care provider. Make sure you discuss any questions you have with your health care provider. Document Revised: 03/06/2018 Document Reviewed: 03/06/2018 Elsevier Patient Education  2022 [place] was seen today for pain in buttocks.  Diagnoses and all orders for this visit:  Sciatica of left side  Buttock pain  Acute left-sided low back pain with left-sided sciatica  Other orders -     tiZANidine (ZANAFLEX) 4 MG tablet; Take 1 tablet (4 mg total) by mouth 2 (two) times daily as needed for muscle spasms. -     meloxicam (MOBIC) 15 MG tablet; Take 1 tablet (15 mg total) by mouth daily. May use 1/2 or 1 tablet daily   Follow up: prn

## 2020-12-15 ENCOUNTER — Other Ambulatory Visit: Payer: Self-pay | Admitting: Medical

## 2020-12-22 ENCOUNTER — Other Ambulatory Visit: Payer: Self-pay

## 2020-12-22 MED ORDER — TIZANIDINE HCL 4 MG PO TABS: 4.0000 mg | ORAL_TABLET | Freq: Two times a day (BID) | ORAL | 0 refills | Status: DC | PRN

## 2020-12-22 NOTE — Telephone Encounter (Signed)
Patient notified without any further questions or concerns  

## 2020-12-22 NOTE — Telephone Encounter (Signed)
Patient called back and she pulled this muscle around last Sunday and is not getting any better, after digging in garden. Requests refill on Tizanadine sent to [facility]

## 2020-12-22 NOTE — Telephone Encounter (Signed)
Patient left VM from Friday evening requesting refill on muscle relaxer has pulled a groin muscle now, left patient a voicemail to call back and discuss

## 2020-12-31 ENCOUNTER — Telehealth: Payer: Self-pay

## 2020-12-31 MED ORDER — ROSUVASTATIN CALCIUM 10 MG PO TABS: 10.0000 mg | ORAL_TABLET | Freq: Every day | ORAL | 0 refills | Status: DC

## 2020-12-31 NOTE — Telephone Encounter (Signed)
Received fax from [facility] for a refill on the pts. Rosuvastatin last apt. Was 12/09/20.

## 2020-12-31 NOTE — Telephone Encounter (Signed)
done

## 2021-01-02 ENCOUNTER — Other Ambulatory Visit: Payer: Self-pay | Admitting: Medical

## 2021-01-05 ENCOUNTER — Encounter: Payer: Self-pay | Admitting: Internal Medicine

## 2021-01-05 ENCOUNTER — Other Ambulatory Visit: Payer: Self-pay | Admitting: Orthopedic Surgery

## 2021-01-05 DIAGNOSIS — M545 Low back pain, unspecified: Secondary | ICD-10-CM

## 2021-01-08 ENCOUNTER — Telehealth: Payer: Self-pay | Admitting: Internal Medicine

## 2021-01-08 NOTE — Telephone Encounter (Signed)
Patient is overdue for her pneumonia shot. Please advise which one to give and I will call to schedule her

## 2021-01-09 NOTE — Telephone Encounter (Signed)
Pt thinks she had a pneumonia shot a couple years ago before she moved here and will try to find it and call us back

## 2021-01-10 ENCOUNTER — Ambulatory Visit
Admission: RE | Admit: 2021-01-10 | Discharge: 2021-01-10 | Disposition: A | Discharge status: Home / Self Care | Payer: Medicare Other | Source: Ambulatory Visit | Attending: Orthopedic Surgery | Admitting: Orthopedic Surgery

## 2021-01-10 ENCOUNTER — Other Ambulatory Visit: Payer: Self-pay

## 2021-01-10 DIAGNOSIS — M545 Low back pain, unspecified: Secondary | ICD-10-CM

## 2021-01-11 ENCOUNTER — Other Ambulatory Visit: Payer: Self-pay | Admitting: Medical

## 2021-01-23 ENCOUNTER — Other Ambulatory Visit: Payer: Self-pay | Admitting: Medical

## 2021-02-09 NOTE — Telephone Encounter (Signed)
Patient had pnevar 13 in 02/06/18. Does she get the pneum23 or can get the pnevar 20. She has an app on 12/23 for her AWV here so she can get the shot then

## 2021-02-10 NOTE — Telephone Encounter (Signed)
Left message that she is due for pneumonia and she can schedule anytime

## 2021-03-06 ENCOUNTER — Other Ambulatory Visit: Payer: Self-pay

## 2021-03-06 ENCOUNTER — Ambulatory Visit (INDEPENDENT_AMBULATORY_CARE_PROVIDER_SITE_OTHER): Payer: Medicare Other

## 2021-03-06 VITALS — BP 110/70 | HR 72 | Temp 97.4°F | Ht 60.0 in | Wt 147.6 lb

## 2021-03-06 DIAGNOSIS — Z23 Encounter for immunization: Secondary | ICD-10-CM | POA: Diagnosis not present

## 2021-03-06 DIAGNOSIS — Z0000 Encounter for general adult medical examination without abnormal findings: Secondary | ICD-10-CM

## 2021-03-06 NOTE — Patient Instructions (Signed)
Jamie Singh , Thank you for taking time to come for your Medicare Wellness Visit. I appreciate your ongoing commitment to your health goals. Please review the following plan we discussed and let me know if I can assist you in the future.   Screening recommendations/referrals: Colonoscopy: completed 08/26/2016 Mammogram: completed 11/21/2020 Bone Density: completed 11/12/2020 Recommended yearly ophthalmology/optometry visit for glaucoma screening and checkup Recommended yearly dental visit for hygiene and checkup  Vaccinations: Influenza vaccine: completed 11/26/2020 Pneumococcal vaccine: today Tdap vaccine: due Shingles vaccine: completed   Covid-19: 11/26/2020, 12/12/2019, 04/16/2019, 03/24/2019  Advanced directives: Please bring a copy of your POA (Power of Attorney) and/or Living Will to your next appointment.   Conditions/risks identified: none  Next appointment: Follow up in one year for your annual wellness visit    Preventive Care 65 Years and Older, Female Preventive care refers to lifestyle choices and visits with your health care provider that can promote health and wellness. What does preventive care include? A yearly physical exam. This is also called an annual well check. Dental exams once or twice a year. Routine eye exams. Ask your health care provider how often you should have your eyes checked. Personal lifestyle choices, including: Daily care of your teeth and gums. Regular physical activity. Eating a healthy diet. Avoiding tobacco and drug use. Limiting alcohol use. Practicing safe sex. Taking low-dose aspirin every day. Taking vitamin and mineral supplements as recommended by your health care provider. What happens during an annual well check? The services and screenings done by your health care provider during your annual well check will depend on your age, overall health, lifestyle risk factors, and family history of disease. Counseling  Your health care  provider may ask you questions about your: Alcohol use. Tobacco use. Drug use. Emotional well-being. Home and relationship well-being. Sexual activity. Eating habits. History of falls. Memory and ability to understand (cognition). Work and work Statistician. Reproductive health. Screening  You may have the following tests or measurements: Height, weight, and BMI. Blood pressure. Lipid and cholesterol levels. These may be checked every 5 years, or more frequently if you are over 34 years old. Skin check. Lung cancer screening. You may have this screening every year starting at age 33 if you have a 30-pack-year history of smoking and currently smoke or have quit within the past 15 years. Fecal occult blood test (FOBT) of the stool. You may have this test every year starting at age 67. Flexible sigmoidoscopy or colonoscopy. You may have a sigmoidoscopy every 5 years or a colonoscopy every 10 years starting at age 88. Hepatitis C blood test. Hepatitis B blood test. Sexually transmitted disease (STD) testing. Diabetes screening. This is done by checking your blood sugar (glucose) after you have not eaten for a while (fasting). You may have this done every 1-3 years. Bone density scan. This is done to screen for osteoporosis. You may have this done starting at age 79. Mammogram. This may be done every 1-2 years. Talk to your health care provider about how often you should have regular mammograms. Talk with your health care provider about your test results, treatment options, and if necessary, the need for more tests. Vaccines  Your health care provider may recommend certain vaccines, such as: Influenza vaccine. This is recommended every year. Tetanus, diphtheria, and acellular pertussis (Tdap, Td) vaccine. You may need a Td booster every 10 years. Zoster vaccine. You may need this after age 40. Pneumococcal 13-valent conjugate (PCV13) vaccine. One dose is recommended after  age  15. Pneumococcal polysaccharide (PPSV23) vaccine. One dose is recommended after age 7. Talk to your health care provider about which screenings and vaccines you need and how often you need them. This information is not intended to replace advice given to you by your health care provider. Make sure you discuss any questions you have with your health care provider. Document Released: 03/14/2015 Document Revised: 11/05/2015 Document Reviewed: 12/17/2014 Elsevier Interactive Patient Education  2017 [place] Prevention in the Home Falls can cause injuries. They can happen to people of all ages. There are many things you can do to make your home safe and to help prevent falls. What can I do on the outside of my home? Regularly fix the edges of walkways and driveways and fix any cracks. Remove anything that might make you trip as you walk through a door, such as a raised step or threshold. Trim any bushes or trees on the path to your home. Use bright outdoor lighting. Clear any walking paths of anything that might make someone trip, such as rocks or tools. Regularly check to see if handrails are loose or broken. Make sure that both sides of any steps have handrails. Any raised decks and porches should have guardrails on the edges. Have any leaves, snow, or ice cleared regularly. Use sand or salt on walking paths during winter. Clean up any spills in your garage right away. This includes oil or grease spills. What can I do in the bathroom? Use night lights. Install grab bars by the toilet and in the tub and shower. Do not use towel bars as grab bars. Use non-skid mats or decals in the tub or shower. If you need to sit down in the shower, use a plastic, non-slip stool. Keep the floor dry. Clean up any water that spills on the floor as soon as it happens. Remove soap buildup in the tub or shower regularly. Attach bath mats securely with double-sided non-slip rug tape. Do not have throw  rugs and other things on the floor that can make you trip. What can I do in the bedroom? Use night lights. Make sure that you have a light by your bed that is easy to reach. Do not use any sheets or blankets that are too big for your bed. They should not hang down onto the floor. Have a firm chair that has side arms. You can use this for support while you get dressed. Do not have throw rugs and other things on the floor that can make you trip. What can I do in the kitchen? Clean up any spills right away. Avoid walking on wet floors. Keep items that you use a lot in easy-to-reach places. If you need to reach something above you, use a strong step stool that has a grab bar. Keep electrical cords out of the way. Do not use floor polish or wax that makes floors slippery. If you must use wax, use non-skid floor wax. Do not have throw rugs and other things on the floor that can make you trip. What can I do with my stairs? Do not leave any items on the stairs. Make sure that there are handrails on both sides of the stairs and use them. Fix handrails that are broken or loose. Make sure that handrails are as long as the stairways. Check any carpeting to make sure that it is firmly attached to the stairs. Fix any carpet that is loose or worn. Avoid having throw rugs  at the top or bottom of the stairs. If you do have throw rugs, attach them to the floor with carpet tape. Make sure that you have a light switch at the top of the stairs and the bottom of the stairs. If you do not have them, ask someone to add them for you. What else can I do to help prevent falls? Wear shoes that: Do not have high heels. Have rubber bottoms. Are comfortable and fit you well. Are closed at the toe. Do not wear sandals. If you use a stepladder: Make sure that it is fully opened. Do not climb a closed stepladder. Make sure that both sides of the stepladder are locked into place. Ask someone to hold it for you, if  possible. Clearly mark and make sure that you can see: Any grab bars or handrails. First and last steps. Where the edge of each step is. Use tools that help you move around (mobility aids) if they are needed. These include: Canes. Walkers. Scooters. Crutches. Turn on the lights when you go into a dark area. Replace any light bulbs as soon as they burn out. Set up your furniture so you have a clear path. Avoid moving your furniture around. If any of your floors are uneven, fix them. If there are any pets around you, be aware of where they are. Review your medicines with your doctor. Some medicines can make you feel dizzy. This can increase your chance of falling. Ask your doctor what other things that you can do to help prevent falls. This information is not intended to replace advice given to you by your health care provider. Make sure you discuss any questions you have with your health care provider. Document Released: 12/12/2008 Document Revised: 07/24/2015 Document Reviewed: 03/22/2014 Elsevier Interactive Patient Education  2017 [employer].

## 2021-03-06 NOTE — Progress Notes (Signed)
This visit occurred during the SARS-CoV-2 public health emergency.  Safety protocols were in place, including screening questions prior to the visit, additional usage of staff PPE, and extensive cleaning of exam room while observing appropriate contact time as indicated for disinfecting solutions.  Subjective:   Jamie Singh is a 71 y.o. female who presents for Medicare Annual (Subsequent) preventive examination.  Review of Systems     Cardiac Risk Factors include: advanced age (>71men, >51 women)     Objective:    Today's Vitals   03/06/21 0858  BP: 110/70  Pulse: 72  Temp: (!) 97.4 F (36.3 C)  TempSrc: Oral  SpO2: 98%  Weight: 147 lb 9.6 oz (67 kg)  Height: 5' (1.524 m)   Body mass index is 28.83 kg/m.  Advanced Directives 03/06/2021 08/22/2020 03/30/2020 02/11/2020  Does Patient Have a Medical Advance Directive? Yes Yes No Yes  Type of Paramedic of [place];Living will [place];Living will - Living will;Healthcare Power of Attorney  Does patient want to make changes to medical advance directive? - - - No - Patient declined  Copy of [place] in Chart? No - copy requested No - copy requested - No - copy requested    Current Medications (verified) Outpatient Encounter Medications as of 03/06/2021  Medication Sig   albuterol (VENTOLIN HFA) 108 (90 Base) MCG/ACT inhaler Inhale 2 puffs into the lungs every 6 (six) hours as needed for wheezing or shortness of breath.   amphetamine-dextroamphetamine (ADDERALL) 10 MG tablet Take 1 tablet (10 mg total) by mouth daily with breakfast.   buPROPion (WELLBUTRIN XL) 150 MG 24 hr tablet TAKE 1 TABLET EVERY DAY   Calcium-Magnesium 250-125 MG TABS Take 2 tablets by mouth daily.   cholecalciferol (VITAMIN D3) 25 MCG (1000 UNIT) tablet Take 1 tablet (1,000 Units total) by mouth daily.   famotidine (PEPCID) 20 MG tablet TAKE 1 TABLET AT BEDTIME   levothyroxine (SYNTHROID) 100  MCG tablet TAKE 1 TABLET EVERY DAY   pantoprazole (PROTONIX) 40 MG tablet TAKE 1 TABLET EVERY DAY   PSYLLIUM HUSK PO Take 2,000 mg by mouth daily.   rosuvastatin (CRESTOR) 10 MG tablet Take 1 tablet (10 mg total) by mouth daily.   valACYclovir (VALTREX) 500 MG tablet Take 500 mg by mouth as needed.   escitalopram (LEXAPRO) 10 MG tablet TAKE 1 TABLET (10 MG TOTAL) BY MOUTH DAILY.   meloxicam (MOBIC) 15 MG tablet Take 1 tablet (15 mg total) by mouth daily. May use 1/2 or 1 tablet daily   SYNTHROID 125 MCG tablet    tiZANidine (ZANAFLEX) 4 MG tablet Take 1 tablet (4 mg total) by mouth 2 (two) times daily as needed for muscle spasms.   No facility-administered encounter medications on file as of 03/06/2021.    Allergies (verified) Pollen extract   History: Past Medical History:  Diagnosis Date   Asthma    mild, but prior on preventative FLovent   Endometrial cancer ([place]) 2020   Genital herpes    GERD (gastroesophageal reflux disease)    Hypothyroidism    Thoracic aortic aneurysm    Wears hearing aid    Past Surgical History:  Procedure Laterality Date   COLONOSCOPY  2019   ROBOTIC ASSISTED TOTAL HYSTERECTOMY WITH BILATERAL SALPINGO OOPHERECTOMY     RA-TLH/BSO   TONSILLECTOMY AND ADENOIDECTOMY     Family History  Problem Relation Age of Onset   COPD Mother    Heart failure Mother  Macular degeneration Mother    Osteoporosis Mother    Cancer Mother        Mom needed a hysterectomy, thinks that she may have had uterine cancer   Heart disease Father        MI   COPD Father    Aortic aneurysm Father    Heart disease Brother        MI   Myelodysplastic syndrome Brother    Osteoporosis Maternal Grandmother    Heart failure Maternal Grandmother    Heart failure Maternal Grandfather    COPD Maternal Grandfather    Stroke Paternal Grandfather    Breast cancer Neg Hx    Colon cancer Neg Hx    Social History   Socioeconomic History   Marital status: Married    Spouse  name: Not on file   Number of children: Not on file   Years of education: Not on file   Highest education level: Not on file  Occupational History   Occupation: Environmental education officer - retired  Tobacco Use   Smoking status: Never   Smokeless tobacco: Never  Scientific laboratory technician Use: Never used  Substance and Sexual Activity   Alcohol use: Not Currently    Comment: 2-4 times monthly   Drug use: Yes    Types: Marijuana    Comment: gummies   Sexual activity: Not Currently  Other Topics Concern   Not on file  Social History Narrative   Lives with husband and dog.  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   Social Determinants of Health   Financial Resource Strain: Low Risk    Difficulty of Paying Living Expenses: Not hard at all  Food Insecurity: No Food Insecurity   Worried About Charity fundraiser in the Last Year: Never true   Ran Out of Food in the Last Year: Never true  Transportation Needs: No Transportation Needs   Lack of Transportation (Medical): No   Lack of Transportation (Non-Medical): No  Physical Activity: Sufficiently Active   Days of Exercise per Week: 5 days   Minutes of Exercise per Session: 60 min  Stress: No Stress Concern Present   Feeling of Stress : Not at all  Social Connections: Not on file    Tobacco Counseling Counseling given: Not Answered   Clinical Intake:  Pre-visit preparation completed: Yes  Pain : No/denies pain     Nutritional Status: BMI 25 -29 Overweight Nutritional Risks: None Diabetes: No  How often do you need to have someone help you when you read instructions, pamphlets, or other written materials from your doctor or pharmacy?: 1 - Never What is the last grade level you completed in school?: bachelor's degree  Diabetic? no  Interpreter Needed?: No  Information entered by :: NAllen LPN   Activities of Daily Living In your present state of health, do you have any difficulty performing the following activities: 03/06/2021  03/05/2021  Hearing? N N  Comment has hearing aides -  Vision? N N  Difficulty concentrating or making decisions? N N  Walking or climbing stairs? N N  Dressing or bathing? N N  Doing errands, shopping? N N  Preparing Food and eating ? N N  Using the Toilet? N N  In the past six months, have you accidently leaked urine? Y Y  Do you have problems with loss of bowel control? N N  Managing your Medications? N N  Managing your Finances? N N  Housekeeping or managing your Housekeeping? N  N  Some recent data might be hidden    Patient Care Team: Tysinger, Camelia Eng, PA-C as PCP - General (Family Medicine)  Indicate any recent Medical Services you may have received from other than [facility] providers in the past year (date may be approximate).     Assessment:   This is a routine wellness examination for [place].  Hearing/Vision screen Vision Screening - Comments:: Regular eye exams, [facility], Dr. Nolon Rod  Dietary issues and exercise activities discussed: Current Exercise Habits: Structured exercise class, Type of exercise: Other - see comments (water aerobics), Time (Minutes): 60, Frequency (Times/Week): 5, Weekly Exercise (Minutes/Week): 300   Goals Addressed             This Visit's Progress    Patient Stated       03/06/2021, wants to weigh 135 pounds       Depression Screen PHQ 2/9 Scores 03/06/2021 12/09/2020 08/20/2020 02/20/2020 10/18/2019  PHQ - 2 Score 0 0 0 2 1  PHQ- 9 Score - - - 7 5    Fall Risk Fall Risk  03/06/2021 03/05/2021 02/16/2021 02/16/2021 12/09/2020  Falls in the past year? 1 1 1 1 1   Comment slipped - - - -  Number falls in past yr: 1 0 0 0 0  Injury with Fall? 1 1 1 1 1   Comment broke shoulder - - - -  Risk for fall due to : Medication side effect - - - Impaired balance/gait  Follow up Falls evaluation completed;Education provided;Falls prevention discussed - - - Falls evaluation completed    FALL RISK PREVENTION PERTAINING TO THE HOME:  Any stairs  in or around the home? Yes  If so, are there any without handrails? No  Home free of loose throw rugs in walkways, pet beds, electrical cords, etc? Yes  Adequate lighting in your home to reduce risk of falls? Yes   ASSISTIVE DEVICES UTILIZED TO PREVENT FALLS:  Life alert? No  Use of a cane, walker or w/c? No  Grab bars in the bathroom? No  Shower chair or bench in shower? No  Elevated toilet seat or a handicapped toilet? Yes   TIMED UP AND GO:  Was the test performed? No .    Gait steady and fast without use of assistive device  Cognitive Function:     6CIT Screen 03/06/2021  What Year? 0 points  What month? 0 points  What time? 0 points  Count back from 20 0 points  Months in reverse 0 points  Repeat phrase 2 points  Total Score 2    Immunizations Immunization History  Administered Date(s) Administered   Influenza, High Dose Seasonal PF 11/28/2019, 11/26/2020   Influenza-Unspecified 12/21/2016, 12/01/2018   PFIZER(Purple Top)SARS-COV-2 Vaccination 03/24/2019, 04/16/2019, 12/12/2019   PNEUMOCOCCAL CONJUGATE-20 03/06/2021   Pfizer Covid-19 Vaccine Bivalent Booster 41yrs & up 11/26/2020   Pneumococcal Conjugate-13 02/06/2018   Zoster Recombinat (Shingrix) 12/01/2018, 05/08/2019    TDAP status: Due, Education has been provided regarding the importance of this vaccine. Advised may receive this vaccine at local pharmacy or Health Dept. Aware to provide a copy of the vaccination record if obtained from local pharmacy or Health Dept. Verbalized acceptance and understanding.  Flu Vaccine status: Up to date  Pneumococcal vaccine status: Completed during today's visit.  Covid-19 vaccine status: Completed vaccines  Qualifies for Shingles Vaccine? Yes   Zostavax completed No   Shingrix Completed?: Yes  Screening Tests Health Maintenance  Topic Date Due   TETANUS/TDAP  Never done   MAMMOGRAM  11/22/2022   COLONOSCOPY (Pts 45-59yrs Insurance coverage will need to be  confirmed)  08/27/2026   Pneumonia Vaccine 52+ Years old  Completed   INFLUENZA VACCINE  Completed   DEXA SCAN  Completed   COVID-19 Vaccine  Completed   Hepatitis C Screening  Completed   Zoster Vaccines- Shingrix  Completed   HPV VACCINES  Aged Out    Health Maintenance  Health Maintenance Due  Topic Date Due   TETANUS/TDAP  Never done    Colorectal cancer screening: Type of screening: Colonoscopy. Completed 08/26/2016. Repeat every 10 years  Mammogram status: Completed 11/21/2020. Repeat every year  Bone Density status: Completed 11/12/2020.   Lung Cancer Screening: (Low Dose CT Chest recommended if Age 52-80 years, 30 pack-year currently smoking OR have quit w/in 15years.) does not qualify.   Lung Cancer Screening Referral: no  Additional Screening:  Hepatitis C Screening: does qualify; Completed 02/20/2020  Vision Screening: Recommended annual ophthalmology exams for early detection of glaucoma and other disorders of the eye. Is the patient up to date with their annual eye exam?  Yes  Who is the provider or what is the name of the office in which the patient attends annual eye exams? [place] If pt is not established with a provider, would they like to be referred to a provider to establish care? No .   Dental Screening: Recommended annual dental exams for proper oral hygiene  Community Resource Referral / Chronic Care Management: CRR required this visit?  No   CCM required this visit?  No      Plan:     I have personally reviewed and noted the following in the patients chart:   Medical and social history Use of alcohol, tobacco or illicit drugs  Current medications and supplements including opioid prescriptions.  Functional ability and status Nutritional status Physical activity Advanced directives List of other physicians Hospitalizations, surgeries, and ER visits in previous 12 months Vitals Screenings to include cognitive, depression, and  falls Referrals and appointments  In addition, I have reviewed and discussed with patient certain preventive protocols, quality metrics, and best practice recommendations. A written personalized care plan for preventive services as well as general preventive health recommendations were provided to patient.     Kellie Simmering, LPN   8/0/1655   Nurse Notes: none

## 2021-03-23 ENCOUNTER — Telehealth: Payer: Self-pay

## 2021-03-23 NOTE — Telephone Encounter (Signed)
Received call from Ms. Jamie Singh requesting to schedule her follow up appointment with Dr. Berline Lopes. Appointment scheduled for 06/01/21 at 1:45pm. Patient is in agreement of date and time. Instructed to call with any needs.

## 2021-04-26 ENCOUNTER — Other Ambulatory Visit: Payer: Self-pay | Admitting: Medical

## 2021-04-27 ENCOUNTER — Other Ambulatory Visit: Payer: Self-pay | Admitting: Medical

## 2021-04-27 NOTE — Telephone Encounter (Signed)
[facility] well[facility]. Please advise [facility] 

## 2021-06-01 ENCOUNTER — Other Ambulatory Visit: Payer: Self-pay

## 2021-06-01 ENCOUNTER — Inpatient Hospital Stay: Payer: Medicare Other | Attending: Gynecologic Oncology | Admitting: Gynecologic Oncology

## 2021-06-01 ENCOUNTER — Other Ambulatory Visit: Payer: Self-pay | Admitting: Medical

## 2021-06-01 VITALS — BP 122/82 | HR 78 | Temp 98.9°F | Resp 16 | Ht 60.0 in | Wt 146.9 lb

## 2021-06-01 DIAGNOSIS — Z90710 Acquired absence of both cervix and uterus: Secondary | ICD-10-CM | POA: Diagnosis not present

## 2021-06-01 DIAGNOSIS — Z90722 Acquired absence of ovaries, bilateral: Secondary | ICD-10-CM | POA: Diagnosis not present

## 2021-06-01 DIAGNOSIS — C541 Malignant neoplasm of endometrium: Secondary | ICD-10-CM

## 2021-06-01 DIAGNOSIS — Z8542 Personal history of malignant neoplasm of other parts of uterus: Secondary | ICD-10-CM | POA: Diagnosis present

## 2021-06-01 DIAGNOSIS — K5900 Constipation, unspecified: Secondary | ICD-10-CM | POA: Insufficient documentation

## 2021-06-01 DIAGNOSIS — Z923 Personal history of irradiation: Secondary | ICD-10-CM | POA: Insufficient documentation

## 2021-06-01 MED ORDER — AMPHETAMINE-DEXTROAMPHETAMINE 10 MG PO TABS: 10.0000 mg | ORAL_TABLET | Freq: Every day | ORAL | 0 refills | Status: DC

## 2021-06-01 NOTE — Patient Instructions (Addendum)
It was good to see you today.  I do not see or feel any evidence of cancer on your exam. ? ?Please call me in the next 3-4 weeks after you start taking MiraLAX for your bowels.  If your constipation does not improve, I think we should get a CT scan. ? ?We will continue with visits every 6 months.  My schedule is not out past the summer.  Please call back in September to get a visit scheduled to see me in October. ? ?As always, if you develop any of the symptoms that we have discussed that would be concerning for cancer recurrence, please reach out to see me sooner. ?

## 2021-06-01 NOTE — Progress Notes (Signed)
Gynecologic Oncology Return Clinic Visit ? ?06/01/2021 ? ?Reason for Visit: Surveillance visit in the setting of endometrial cancer ? 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  On 08/18/2018 she underwent robotic assisted total laparoscopic hysterectomy, bilateral salpingo-oophorectomy, bilateral sentinel lymph node dissection, and collection of pelvic washings.  Pathology revealed a tumor size of 4.5 cm, myometrial invasion 42% (8 of 19 mm), no LVSI.  Was performed at [facility] with Dr. Fleet Contras.  Patient then received adjuvant vaginal brachytherapy (vaginal cuff HDR, 4 fractions of 5.5 Gray, treated from 8/11-8/21/2020). ?  ?The patient was last seen for surveillance visit in [place] with Dr. Jake Bathe on 11/2019.  At that time she underwent Pap test that was negative. ? ?Interval History: ?Overall doing well.  At the tail end of recovery from a insufficiency fracture of her sacral ala.  Denies any abdominal or pelvic pain.  Has had several weeks of constipation.  Previously had very regular bowel movements with the use of fiber tablets.  When she began noticing some constipation, she increased her daily dose of fiber and started taking a stool softener.  She is having bowel movements every other day that are hard.  Often has to strain to have a bowel movement.  Will note a very small amount of bright red bleeding if she pushes to have a bowel movement.  Has been using her dilator more frequently, 2-3 times a week.  Often will have very limited amount of bright red bleeding immediately after dilator use.  Otherwise denies any vaginal bleeding or discharge. ? ?Past Medical/Surgical History: ?Past Medical History:  ?Diagnosis Date  ? Asthma   ? mild, but prior on preventative FLovent  ? Endometrial cancer ([place]) 2020  ? Genital herpes   ? GERD (gastroesophageal reflux disease)   ? Hypothyroidism   ? Thoracic aortic  aneurysm ([place])   ? Wears hearing aid   ? ? ?Past Surgical History:  ?Procedure Laterality Date  ? COLONOSCOPY  2019  ? ROBOTIC ASSISTED TOTAL HYSTERECTOMY WITH BILATERAL SALPINGO OOPHERECTOMY    ? RA-TLH/BSO  ? TONSILLECTOMY AND ADENOIDECTOMY    ? ? ?Family History  ?Problem Relation Age of Onset  ? COPD Mother   ? Heart failure Mother   ? Macular degeneration Mother   ? Osteoporosis Mother   ? Cancer Mother   ?     Mom needed a hysterectomy, thinks that she may have had uterine cancer  ? Heart disease Father   ?     MI  ? COPD Father   ? Aortic aneurysm Father   ? Heart disease Brother   ?     MI  ? Myelodysplastic syndrome Brother   ? Osteoporosis Maternal Grandmother   ? Heart failure Maternal Grandmother   ? Heart failure Maternal Grandfather   ? COPD Maternal Grandfather   ? Stroke Paternal Grandfather   ? Breast cancer Neg Hx   ? Colon cancer Neg Hx   ? ? ?Social History  ? ?Socioeconomic History  ? Marital status: Married  ?  Spouse name: Not on file  ? Number of children: Not on file  ? Years of education: Not on file  ? Highest education level: Bachelor's degree (e.g., BA, AB, BS)  ?Occupational History  ? Occupation: Environmental education officer - retired  ?Tobacco Use  ? Smoking status: Never  ? Smokeless tobacco: Never  ?Vaping Use  ? Vaping Use: Never used  ?  Substance and Sexual Activity  ? Alcohol use: Not Currently  ?  Comment: 2-4 times monthly  ? Drug use: Yes  ?  Types: Marijuana  ?  Comment: gummies  ? Sexual activity: Not Currently  ?Other Topics Concern  ? Not on file  ?Social History Narrative  ? Lives with husband and dog.  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 ?Social Determinants of Health  ? ?Financial Resource Strain: Low Risk   ? Difficulty of Paying Living Expenses: Not hard at all  ?Food Insecurity: No Food Insecurity  ? Worried About Charity fundraiser in the Last Year: Never true  ? Ran Out of Food in the Last Year: Never true  ?Transportation Needs: No Transportation Needs  ? Lack of  Transportation (Medical): No  ? Lack of Transportation (Non-Medical): No  ?Physical Activity: Sufficiently Active  ? Days of Exercise per Week: 4 days  ? Minutes of Exercise per Session: 60 min  ?Stress: Stress Concern Present  ? Feeling of Stress : To some extent  ?Social Connections: Moderately Isolated  ? Frequency of Communication with Friends and Family: More than three times a week  ? Frequency of Social Gatherings with Friends and Family: Once a week  ? Attends Religious Services: Never  ? Active Member of Clubs or Organizations: No  ? Attends Archivist Meetings: Not on file  ? Marital Status: Married  ? ? ?Current Medications: ? ?Current Outpatient Medications:  ?  albuterol (VENTOLIN HFA) 108 (90 Base) MCG/ACT inhaler, Inhale 2 puffs into the lungs every 6 (six) hours as needed for wheezing or shortness of breath., Disp: , Rfl:  ?  amphetamine-dextroamphetamine (ADDERALL) 10 MG tablet, Take 1 tablet (10 mg total) by mouth daily with breakfast., Disp: 90 tablet, Rfl: 0 ?  buPROPion (WELLBUTRIN XL) 150 MG 24 hr tablet, TAKE 1 TABLET EVERY DAY, Disp: 90 tablet, Rfl: 0 ?  Calcium-Magnesium 250-125 MG TABS, Take 2 tablets by mouth daily., Disp: , Rfl:  ?  cholecalciferol (VITAMIN D3) 25 MCG (1000 UNIT) tablet, Take 1 tablet (1,000 Units total) by mouth daily., Disp: 90 tablet, Rfl: 3 ?  escitalopram (LEXAPRO) 10 MG tablet, TAKE 1 TABLET (10 MG TOTAL) BY MOUTH DAILY., Disp: 90 tablet, Rfl: 0 ?  famotidine (PEPCID) 20 MG tablet, TAKE 1 TABLET AT BEDTIME, Disp: 90 tablet, Rfl: 0 ?  levothyroxine (SYNTHROID) 100 MCG tablet, TAKE 1 TABLET EVERY DAY, Disp: 90 tablet, Rfl: 0 ?  pantoprazole (PROTONIX) 40 MG tablet, TAKE 1 TABLET EVERY DAY, Disp: 90 tablet, Rfl: 0 ?  PSYLLIUM HUSK PO, Take 2,000 mg by mouth daily., Disp: , Rfl:  ?  rosuvastatin (CRESTOR) 10 MG tablet, TAKE 1 TABLET (10 MG TOTAL) BY MOUTH DAILY., Disp: 90 tablet, Rfl: 0 ?  valACYclovir (VALTREX) 500 MG tablet, Take 500 mg by mouth as needed.,  Disp: , Rfl:  ?  tiZANidine (ZANAFLEX) 4 MG tablet, Take 1 tablet (4 mg total) by mouth 2 (two) times daily as needed for muscle spasms., Disp: 20 tablet, Rfl: 0 ? ?Review of Systems: ?Pertinent positives include constipation, dyspareunia, vaginal bleeding, back pain, easy bruising/bleeding, anxiety. ?Denies appetite changes, fevers, chills, fatigue, unexplained weight changes. ?Denies hearing loss, neck lumps or masses, mouth sores, ringing in ears or voice changes. ?Denies cough or wheezing.  Denies shortness of breath. ?Denies chest pain or palpitations. Denies leg swelling. ?Denies abdominal distention, pain, blood in stools, diarrhea, nausea, vomiting, or early satiety. ?Denies dysuria, frequency, hematuria or incontinence. ?  Denies hot flashes, pelvic pain, or vaginal discharge.   ?Denies joint pain, back pain or muscle pain/cramps. ?Denies itching, rash, or wounds. ?Denies dizziness, headaches, numbness or seizures. ?Denies swollen lymph nodes or glands. ?Denies depression, confusion, or decreased concentration. ? ?Physical Exam: ?BP 122/82 (BP Location: Left Arm, Patient Position: Sitting)   Pulse 78   Temp 98.9 ?F (37.2 ?C)   Resp 16   Ht 5' (1.524 m)   Wt 146 lb 14.4 oz (66.6 kg)   SpO2 99%   BMI 28.69 kg/m?  ?General: Alert, oriented, no acute distress. ?HEENT: Normocephalic, atraumatic, sclera anicteric. ?Chest: Clear to auscultation bilaterally.  No wheezes or rhonchi. ?Cardiovascular: Regular rate and rhythm, no murmurs. ?Abdomen: soft, nontender.  Normoactive bowel sounds.  No masses or hepatosplenomegaly appreciated.  Well-healed incisions. ?Extremities: Grossly normal range of motion.  Warm, well perfused.  No edema bilaterally. ?Skin: No rashes or lesions noted. ?Lymphatics: No cervical, supraclavicular, or inguinal adenopathy. ?GU: Normal appearing external genitalia without erythema, excoriation, or lesions.  Speculum exam reveals moderately atrophic shortened vagina.  Radiation changes  noted.  I am able to use a medium size speculum and open it minimally.  No masses, bleeding, or discharge noted.  Bimanual exam reveals cuff and vaginal walls smooth, no nodularity or masses.  Rectovaginal exam confirms thes

## 2021-06-02 ENCOUNTER — Ambulatory Visit (INDEPENDENT_AMBULATORY_CARE_PROVIDER_SITE_OTHER): Payer: Medicare Other | Admitting: Medical

## 2021-06-02 VITALS — BP 110/64 | HR 68 | Wt 147.8 lb

## 2021-06-02 DIAGNOSIS — H6123 Impacted cerumen, bilateral: Secondary | ICD-10-CM | POA: Diagnosis not present

## 2021-06-02 DIAGNOSIS — H9193 Unspecified hearing loss, bilateral: Secondary | ICD-10-CM

## 2021-06-02 NOTE — Progress Notes (Signed)
Subjective:  ? ?Chief Complaint  ?Patient presents with  ? impacted ear wax.  ?  Impacted ear wax and needs it cleaned out  ? ? ?Here for complaint of decreased hearing.  Had hearing eval at audiology, and was advised f/u here at PCP to remove wax. No other aggravating or relieving factors.  No other complaint. ? ?   ?Objective:  ? Physical Exam ? ?General appearance: alert, no distress, WD/WN ?Ears: bilat ear canals with impacted cerumen ?  ? ?Assessment & Plan:  ?  ?Encounter Diagnoses  ?Name Primary?  ? Decreased hearing of both ears Yes  ? Impacted cerumen of both ears   ?  ? ?Discussed findings.  Discussed risk/benefits of procedure and patient agrees to procedure. Successfully used warm water lavage to remove impacted cerumen from bilat ear canal. Patient tolerated procedure well. Advised they avoid using any cotton swabs or other devices to clean the ear canals.  Use basic hygiene as discussed.  Follow up prn.  ?

## 2021-06-07 ENCOUNTER — Other Ambulatory Visit: Payer: Self-pay | Admitting: Medical

## 2021-06-22 ENCOUNTER — Other Ambulatory Visit: Payer: Self-pay | Admitting: Medical

## 2021-07-03 ENCOUNTER — Encounter: Payer: Self-pay | Admitting: Medical

## 2021-07-03 MED ORDER — SODIUM CHLORIDE (PF) 0.9 % IJ SOLN
INTRAMUSCULAR | Status: AC
  Filled 2021-07-03: qty 50

## 2021-09-03 ENCOUNTER — Other Ambulatory Visit: Payer: Self-pay | Admitting: Medical

## 2021-09-21 ENCOUNTER — Other Ambulatory Visit: Payer: Self-pay | Admitting: Medical

## 2021-09-22 ENCOUNTER — Other Ambulatory Visit: Payer: Self-pay | Admitting: Medical

## 2021-09-22 MED ORDER — BUPROPION HCL ER (XL) 150 MG PO TB24: 150.0000 mg | ORAL_TABLET | Freq: Every day | ORAL | 0 refills | Status: DC

## 2021-09-22 NOTE — Telephone Encounter (Signed)
Called pharmacy to cancel wellbutrin l due to the need for an appointment.  Will need approval to be filled. [place]

## 2021-10-14 ENCOUNTER — Other Ambulatory Visit: Payer: Self-pay | Admitting: Medical

## 2021-10-14 DIAGNOSIS — Z1231 Encounter for screening mammogram for malignant neoplasm of breast: Secondary | ICD-10-CM

## 2021-10-30 ENCOUNTER — Encounter: Payer: Self-pay | Admitting: Medical

## 2021-10-30 ENCOUNTER — Ambulatory Visit (INDEPENDENT_AMBULATORY_CARE_PROVIDER_SITE_OTHER): Payer: Medicare Other | Admitting: Medical

## 2021-10-30 VITALS — BP 100/68 | HR 60 | Temp 96.8°F | Ht 59.0 in | Wt 140.4 lb

## 2021-10-30 DIAGNOSIS — E559 Vitamin D deficiency, unspecified: Secondary | ICD-10-CM

## 2021-10-30 DIAGNOSIS — R61 Generalized hyperhidrosis: Secondary | ICD-10-CM | POA: Diagnosis not present

## 2021-10-30 DIAGNOSIS — F32A Depression, unspecified: Secondary | ICD-10-CM

## 2021-10-30 DIAGNOSIS — Z131 Encounter for screening for diabetes mellitus: Secondary | ICD-10-CM

## 2021-10-30 DIAGNOSIS — Z0000 Encounter for general adult medical examination without abnormal findings: Secondary | ICD-10-CM | POA: Diagnosis not present

## 2021-10-30 DIAGNOSIS — Z90710 Acquired absence of both cervix and uterus: Secondary | ICD-10-CM

## 2021-10-30 DIAGNOSIS — Z974 Presence of external hearing-aid: Secondary | ICD-10-CM

## 2021-10-30 DIAGNOSIS — F909 Attention-deficit hyperactivity disorder, unspecified type: Secondary | ICD-10-CM

## 2021-10-30 DIAGNOSIS — R7309 Other abnormal glucose: Secondary | ICD-10-CM

## 2021-10-30 DIAGNOSIS — Z8542 Personal history of malignant neoplasm of other parts of uterus: Secondary | ICD-10-CM

## 2021-10-30 DIAGNOSIS — I712 Thoracic aortic aneurysm, without rupture, unspecified: Secondary | ICD-10-CM

## 2021-10-30 DIAGNOSIS — E2839 Other primary ovarian failure: Secondary | ICD-10-CM

## 2021-10-30 DIAGNOSIS — R8290 Unspecified abnormal findings in urine: Secondary | ICD-10-CM

## 2021-10-30 DIAGNOSIS — Z780 Asymptomatic menopausal state: Secondary | ICD-10-CM

## 2021-10-30 DIAGNOSIS — F419 Anxiety disorder, unspecified: Secondary | ICD-10-CM

## 2021-10-30 DIAGNOSIS — J4520 Mild intermittent asthma, uncomplicated: Secondary | ICD-10-CM

## 2021-10-30 DIAGNOSIS — M810 Age-related osteoporosis without current pathological fracture: Secondary | ICD-10-CM | POA: Insufficient documentation

## 2021-10-30 DIAGNOSIS — E7800 Pure hypercholesterolemia, unspecified: Secondary | ICD-10-CM

## 2021-10-30 DIAGNOSIS — Z1211 Encounter for screening for malignant neoplasm of colon: Secondary | ICD-10-CM | POA: Insufficient documentation

## 2021-10-30 DIAGNOSIS — Z7185 Encounter for immunization safety counseling: Secondary | ICD-10-CM

## 2021-10-30 DIAGNOSIS — K219 Gastro-esophageal reflux disease without esophagitis: Secondary | ICD-10-CM

## 2021-10-30 DIAGNOSIS — E039 Hypothyroidism, unspecified: Secondary | ICD-10-CM | POA: Diagnosis not present

## 2021-10-30 LAB — POCT URINALYSIS DIP (PROADVANTAGE DEVICE)
Bilirubin, UA: NEGATIVE
Glucose, UA: NEGATIVE mg/dL
Ketones, POC UA: NEGATIVE mg/dL
Leukocytes, UA: NEGATIVE
Nitrite, UA: POSITIVE — AB
Protein Ur, POC: NEGATIVE mg/dL
Specific Gravity, Urine: 1.015
Urobilinogen, Ur: 0.2
pH, UA: 7.5 (ref 5.0–8.0)

## 2021-10-30 NOTE — Patient Instructions (Signed)
Health Maintenance, Female Adopting a healthy lifestyle and getting preventive care are important in promoting health and wellness. Ask your health care provider about: The right schedule for you to have regular tests and exams. Things you can do on your own to prevent diseases and keep yourself healthy. What should I know about diet, weight, and exercise? Eat a healthy diet  Eat a diet that includes plenty of vegetables, fruits, low-fat dairy products, and lean protein. Do not eat a lot of foods that are high in solid fats, added sugars, or sodium. Maintain a healthy weight Body mass index (BMI) is used to identify weight problems. It estimates body fat based on height and weight. Your health care provider can help determine your BMI and help you achieve or maintain a healthy weight. Get regular exercise Get regular exercise. This is one of the most important things you can do for your health. Most adults should: Exercise for at least 150 minutes each week. The exercise should increase your heart rate and make you sweat (moderate-intensity exercise). Do strengthening exercises at least twice a week. This is in addition to the moderate-intensity exercise. Spend less time sitting. Even light physical activity can be beneficial. Watch cholesterol and blood lipids Have your blood tested for lipids and cholesterol at 69 years of age, then have this test every 5 years. Have your cholesterol levels checked more often if: Your lipid or cholesterol levels are high. You are older than 69 years of age. You are at high risk for heart disease. What should I know about cancer screening? Depending on your health history and family history, you may need to have cancer screening at various ages. This may include screening for: Breast cancer. Cervical cancer. Colorectal cancer. Skin cancer. Lung cancer. What should I know about heart disease, diabetes, and high blood pressure? Blood pressure and heart  disease High blood pressure causes heart disease and increases the risk of stroke. This is more likely to develop in people who have high blood pressure readings or are overweight. Have your blood pressure checked: Every 3-5 years if you are 18-39 years of age. Every year if you are 40 years old or older. Diabetes Have regular diabetes screenings. This checks your fasting blood sugar level. Have the screening done: Once every three years after age 40 if you are at a normal weight and have a low risk for diabetes. More often and at a younger age if you are overweight or have a high risk for diabetes. What should I know about preventing infection? Hepatitis B If you have a higher risk for hepatitis B, you should be screened for this virus. Talk with your health care provider to find out if you are at risk for hepatitis B infection. Hepatitis C Testing is recommended for: Everyone born from 1945 through 1965. Anyone with known risk factors for hepatitis C. Sexually transmitted infections (STIs) Get screened for STIs, including gonorrhea and chlamydia, if: You are sexually active and are younger than 69 years of age. You are older than 69 years of age and your health care provider tells you that you are at risk for this type of infection. Your sexual activity has changed since you were last screened, and you are at increased risk for chlamydia or gonorrhea. Ask your health care provider if you are at risk. Ask your health care provider about whether you are at high risk for HIV. Your health care provider may recommend a prescription medicine to help prevent HIV   infection. If you choose to take medicine to prevent HIV, you should first get tested for HIV. You should then be tested every 3 months for as long as you are taking the medicine. Pregnancy If you are about to stop having your period (premenopausal) and you may become pregnant, seek counseling before you get pregnant. Take 400 to 800  micrograms (mcg) of folic acid every day if you become pregnant. Ask for birth control (contraception) if you want to prevent pregnancy. Osteoporosis and menopause Osteoporosis is a disease in which the bones lose minerals and strength with aging. This can result in bone fractures. If you are 96 years old or older, or if you are at risk for osteoporosis and fractures, ask your health care provider if you should: Be screened for bone loss. Take a calcium or vitamin D supplement to lower your risk of fractures. Be given hormone replacement therapy (HRT) to treat symptoms of menopause. Follow these instructions at home: Alcohol use Do not drink alcohol if: Your health care provider tells you not to drink. You are pregnant, may be pregnant, or are planning to become pregnant. If you drink alcohol: Limit how much you have to: 0-1 drink a day. Know how much alcohol is in your drink. In the [place] one drink equals one 12 oz bottle of beer (355 mL), one 5 oz glass of wine (148 mL), or one 1 oz glass of hard liquor (44 mL). Lifestyle Do not use any products that contain nicotine or tobacco. These products include cigarettes, chewing tobacco, and vaping devices, such as e-cigarettes. If you need help quitting, ask your health care provider. Do not use street drugs. Do not share needles. Ask your health care provider for help if you need support or information about quitting drugs. General instructions Schedule regular health, dental, and eye exams. Stay current with your vaccines. Tell your health care provider if: You often feel depressed. You have ever been abused or do not feel safe at home. Summary Adopting a healthy lifestyle and getting preventive care are important in promoting health and wellness. Follow your health care provider's instructions about healthy diet, exercising, and getting tested or screened for diseases. Follow your health care provider's instructions on monitoring your  cholesterol and blood pressure. This information is not intended to replace advice given to you by your health care provider. Make sure you discuss any questions you have with your health care provider. Document Revised: 07/07/2020 Document Reviewed: 07/07/2020 Elsevier Patient Education  [place].      Osteoporosis Medications:  Taking medications to treat osteoporosis reduces fracture risk by 50-70%!   1 in 13 American women and 1 in 68 men over age 57 years old have osteoporosis.    Recommendations:   Regular exercise including aerobic and weightbearing exercise, vitamin D supplementation, limiting alcohol and not smoking.    We generally recommend adding a medication at this point to reduce your risk of fracture by reducing the rate of bone turnover or helping to increase bone mass.  Examples would be Fosamax weekly oral medication, Prolia injection every 6 months, or other options.   I want you to consider the options below, then let me know what you may want to do.   All of the medications have potential risks, but they all have the benefit of slowing down the rate of bone turnover as we get older, helping to reduce the risk of a bone fracture.  Options for therapy:   Bisphosphonate such  as alendronate (Fosamax) This is a medication given weekly that strengthens bones by slowing down the rate of bone loss.  This is usually the first-line medication given the lower cost and does not require injection.  Many people tolerate this medication but some do not.  This medicine has to be taken weekly first thing in the morning on empty stomach, and you cannot lie down for an hour after taking the medication.  Risks of the medication includes trouble swallowing the medication, inflammation of the esophagus, gastric ulcers, and rare risk of breakdown of the jawbone.  These medications are usually given for 5 years (3 years if injectable bisphosphonate like Reclast).   Evenity  Compared  with bisphosphonates, this type of medication called monoclonal antibodies produce similar or better bone density results and reduces the chance of all types of fractures. Evenity is delivered via a shot under the skin every month.   Recent research indicates there could be a high risk of spinal column fractures after stopping the drug.  A very rare complication of bisphosphonates and denosumab is a break or crack in the middle of the thighbone.  A second rare complication is delayed healing of the jawbone (osteonecrosis of the jaw). This can occur after an invasive dental procedure such as removing a tooth.   Forteo This medication increases bone density and strength, it is a synthetic version of the parathyroid hormone, and it is given as a daily injection for 2 years.  Risk include leg cramps, nausea, dizziness, elevated calcium, joint pain, and rare risk of bone cancer in animal studies.  Prolia This is an injection given twice yearly that prevents bone dissolving osteoclast cells from forming.  Its a good option for women who can't tolerate bisphosphonate.  The advantages not having to take something every day or every month and it is well-tolerated for the most part.  This type of medication is called a monoclonal antibody, so there are risk of low blood calcium, skin infections, rash, and rarer potential risks are breakdown or death of jawbone, and rare risk of fracture of the thigh bone.  This medication is given ongoing.  Calcitonin  This is an old drug that helps prevent bone loss, used as a daily nasal spray or injection.  It  reduces spinal fractures, but is not effective in other types of fractures so it is usually not the first-line option.  Side effects can include flushing, rash.  There is a small increase in cancer risk with this medication.  Evista This is a selective estrogen receptor modulator (SERM), and is used in breast cancer prevention and treatment.  It is also used in  treatment of osteoporosis.   It is used in women to reduce risk of vertebral fractures.  Side effects include hot flashes, muscle pain, and increased risks of blood clots in the leg.

## 2021-10-30 NOTE — Progress Notes (Deleted)
Complete physical exam  Patient: Jamie Singh   DOB: Apr 01, 1950   71 y.o. Female  MRN: [id number]  Subjective:    No chief complaint on file.   GYPSY KELLOGG is a 71 y.o. female who presents today for a complete physical exam. She reports consuming a {diet types:17450} diet. {types:19826} She generally feels {DESC; WELL/FAIRLY WELL/POORLY:18703}. She reports sleeping {DESC; WELL/FAIRLY WELL/POORLY:18703}. She {does/does not:200015} have additional problems to discuss today.    Most recent fall risk assessment:    06/02/2021    1:49 PM  [place] in the past year? 0  Number falls in past yr: 0  Injury with Fall? 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   Agitation 10/06/2020   Pure hypercholesterolemia 08/20/2020   Vitamin D deficiency, unspecified  08/20/2020   Nausea 08/20/2020   Urinary frequency 08/20/2020   Nail problem 08/20/2020   Chronic nausea 05/19/2020   Aortic valve regurgitation 05/19/2020   Dilated aortic root ([place]) 05/19/2020   Gastroesophageal reflux disease 05/19/2020   Closed fracture of shaft of right humerus with routine healing 05/19/2020   Endometrial cancer ([place]) 05/09/2020   Initial Medicare annual wellness visit 02/20/2020   Encounter for health maintenance examination in adult 02/20/2020   Uses hearing aid 02/20/2020   Encounter for hepatitis C screening test for low risk patient 02/20/2020   Vaccine counseling 02/20/2020   Post-menopausal  02/20/2020   Estrogen deficiency 02/20/2020   Osteopenia 02/20/2020   Snoring 02/20/2020   History of eating disorder 02/20/2020   Diarrhea 01/08/2020   Generalized abdominal pain 01/08/2020   Hot flashes 10/18/2019   History of endometrial cancer 10/18/2019   Mild intermittent asthma without complication 12/05/1217   Thoracic aortic aneurysm without rupture ([place]) 10/18/2019   S/P hysterectomy 10/18/2019   Bruises easily 10/18/2019   Excessive sweating 10/18/2019   Hypothyroidism 10/18/2019   Past Medical History:  Diagnosis Date   Asthma    mild, but prior on preventative FLovent   Endometrial cancer ([place]) 2020   Genital herpes    GERD (gastroesophageal reflux disease)    Hypothyroidism    Thoracic aortic aneurysm ([place])    Wears hearing aid    Past Surgical History:  Procedure Laterality Date   COLONOSCOPY  2019   ROBOTIC ASSISTED TOTAL HYSTERECTOMY WITH BILATERAL SALPINGO OOPHERECTOMY     RA-TLH/BSO   TONSILLECTOMY AND ADENOIDECTOMY     Social History   Tobacco Use   Smoking status: Never   Smokeless tobacco: Never  Vaping Use   Vaping Use: Never used  Substance Use Topics   Alcohol use: Not Currently    Comment: 2-4 times monthly   Drug use: Yes    Types: Marijuana    Comment: gummies   Family History  Problem Relation Age of Onset   COPD Mother    Heart failure Mother    Macular degeneration Mother  Osteoporosis Mother    Cancer Mother        Mom needed a hysterectomy, thinks that she may have had uterine cancer   Heart disease Father        MI   COPD Father    Aortic aneurysm Father    Heart disease Brother        MI   Myelodysplastic syndrome Brother    Osteoporosis Maternal Grandmother    Heart failure Maternal Grandmother    Heart failure Maternal Grandfather    COPD Maternal Grandfather    Stroke Paternal Grandfather    Breast cancer Neg Hx    Colon cancer Neg Hx    Allergies  Allergen Reactions   Pollen Extract Hives       Patient Care Team: Tysinger, Camelia Eng, PA-C as PCP - General (Family Medicine)   Outpatient Medications Prior to Visit  Medication Sig   albuterol (VENTOLIN HFA) 108 (90 Base) MCG/ACT inhaler Inhale 2 puffs into the lungs every 6 (six) hours as needed for wheezing or shortness of breath.   amphetamine-dextroamphetamine (ADDERALL) 10 MG tablet Take 1 tablet (10 mg total) by mouth daily with breakfast.   buPROPion (WELLBUTRIN XL) 150 MG 24 hr tablet Take 1 tablet (150 mg total) by mouth daily.   Calcium-Magnesium 250-125 MG TABS Take 2 tablets by mouth daily.   cholecalciferol (VITAMIN D) 25 MCG (1000 UNIT) tablet TAKE 1 TABLET EVERY DAY   escitalopram (LEXAPRO) 10 MG tablet TAKE 1 TABLET (10 MG TOTAL) BY MOUTH DAILY.   famotidine (PEPCID) 20 MG tablet TAKE 1 TABLET AT BEDTIME   levothyroxine (SYNTHROID) 100 MCG tablet TAKE 1 TABLET EVERY DAY   pantoprazole (PROTONIX) 40 MG tablet TAKE 1 TABLET EVERY DAY   PSYLLIUM HUSK PO Take 2,000 mg by mouth daily.   rosuvastatin (CRESTOR) 10 MG tablet TAKE 1 TABLET EVERY DAY   valACYclovir (VALTREX) 500 MG tablet Take 500 mg by mouth as needed.   No facility-administered medications prior to visit.    ROS        Objective:     There were no vitals taken for this visit. BP Readings from Last 3 Encounters:  06/02/21 110/64  06/01/21 122/82  03/06/21 110/70   Wt Readings from Last 3 Encounters:  06/02/21 147 lb 12.8 oz (67 kg)  06/01/21 146 lb 14.4 oz (66.6 kg)  03/06/21 147 lb 9.6 oz (67 kg)      Physical Exam   No results found for any visits on 10/30/21. Last CBC Lab Results  Component Value Date   WBC 8.4 10/18/2019   HGB 14.5 10/18/2019   HCT 43.1 10/18/2019   MCV 94 10/18/2019   MCH 31.6 10/18/2019   RDW 13.0 10/18/2019   PLT 228 54/00/8676   Last metabolic panel Lab Results  Component Value Date   GLUCOSE 97 10/18/2019   NA 138 10/18/2019   K 5.1 10/18/2019   CL 102 10/18/2019   CO2 23 10/18/2019   BUN 23  10/18/2019   CREATININE 0.91 10/18/2019   GFRNONAA 65 10/18/2019   CALCIUM 9.6 10/18/2019   PROT 7.0 10/18/2019   ALBUMIN 4.3 10/18/2019   LABGLOB 2.7 10/18/2019   AGRATIO 1.6 10/18/2019   BILITOT 0.4 10/18/2019   ALKPHOS 116 10/18/2019   AST 26 10/18/2019   ALT 24 10/18/2019   Last lipids Lab Results  Component Value Date   CHOL 206 (H) 08/20/2020   HDL 89 08/20/2020   LDLCALC 101 (H) 08/20/2020  TRIG 90 08/20/2020   CHOLHDL 2.3 08/20/2020   Last vitamin D Lab Results  Component Value Date   VD25OH 29.4 (L) 08/20/2020        Assessment & Plan:    Routine Health Maintenance and Physical Exam  Immunization History  Administered Date(s) Administered   Influenza, High Dose Seasonal PF 11/28/2019, 11/26/2020   Influenza-Unspecified 12/21/2016, 12/01/2018   PFIZER(Purple Top)SARS-COV-2 Vaccination 03/24/2019, 04/16/2019, 12/12/2019   PNEUMOCOCCAL CONJUGATE-20 03/06/2021   Pfizer Covid-19 Vaccine Bivalent Booster 21yr & up 11/26/2020   Pneumococcal Conjugate-13 02/06/2018   Tdap 06/02/2021   Zoster Recombinat (Shingrix) 12/01/2018, 05/08/2019    Health Maintenance  Topic Date Due   COVID-19 Vaccine (5 - Pfizer risk series) 01/21/2021   INFLUENZA VACCINE  09/29/2021   MAMMOGRAM  11/22/2022   COLONOSCOPY (Pts 45-438yrInsurance coverage will need to be confirmed)  08/27/2026   TETANUS/TDAP  06/03/2031   Pneumonia Vaccine 6538Years old  Completed   DEXA SCAN  Completed   Hepatitis C Screening  Completed   Zoster Vaccines- Shingrix  Completed   HPV VACCINES  Aged Out    Discussed health benefits of physical activity, and encouraged her to engage in regular exercise appropriate for her age and condition.  Problem List Items Addressed This Visit   None  No follow-ups on file.     KiElyse JarvisRMA

## 2021-10-30 NOTE — Progress Notes (Signed)
Patient: Jamie Singh   DOB: 31-Aug-1950   71 y.o. Female  MRN: [id number]  Subjective:    Chief Complaint  Patient presents with   Annual Exam    Fasting    Jamie Singh is a 71 y.o. female who presents today for a complete physical exam. She reports consuming a low fat, low sodium, and low carb.  diet. Gym/ health club routine includes light weights, low impact aerobics, and walking on track . She generally feels well. She reports sleeping well. She does have additional problems to discuss today.   Medical team: Dr. Jeral Pinch, gynecology oncology Dr. Mart Piggs and Dr. Frazier Butt, cardiothoracic surgery Dr. Dorna Leitz, [place] eye doctor Sees dentist [facility] dermatology Josua Ferrebee, Camelia Eng, PA-C here for primary care   Concerns: Has a lot of phlegm in the mornings for some reason  Fingernails have lot of ridges, been flaking.  Has had nail ridges fore ever, but flaking within last 6 months.  Thinks her thyroid is off.  Having a lot of sweating.  She continues to complain of excessive dripping sweat.  Compliant with thyroid medicaiton every morning.  Hearing loss - has hearing aids from [employer] that work well for her.   Was using collagen for nails and bones, but this worsened nausea again.  No current nausea problems.    Just saw cardiology recently for updated echocardiogram, hx/o thoracic aortic aneurysm.  Seeing orthopedics.  She dealt with a fracture this past year in the low back did not have a specific trigger although she thinks it was when she was using kettle bells for exercise.  Orthopedist felt like this now puts her in the osteoporotic category.  She is back to doing weightbearing exercise now after period of rest and rehab  She is compliant with her cholesterol medicine without complaint  She is compliant with Wellbutrin and Lexapro without complaint   Past Medical History:  Diagnosis Date   ADHD    Anxiety and depression     Asthma    mild, but prior on preventative FLovent   Endometrial cancer ([place]) 2020   Genital herpes    GERD (gastroesophageal reflux disease)    Hyperlipidemia    Hypothyroidism    Thoracic aortic aneurysm ([place])    Wears hearing aid     Family History  Problem Relation Age of Onset   COPD Mother    Heart failure Mother    Macular degeneration Mother    Osteoporosis Mother    Cancer Mother        Mom needed a hysterectomy, thinks that she may have had uterine cancer   Heart disease Father        MI   COPD Father    Aortic aneurysm Father    Heart disease Brother        MI   Myelodysplastic syndrome Brother    Osteoporosis Maternal Grandmother    Heart failure Maternal Grandmother    Heart failure Maternal Grandfather    COPD Maternal Grandfather    Stroke Paternal Grandfather    Breast cancer Neg Hx    Colon cancer Neg Hx      Current Outpatient Medications:    albuterol (VENTOLIN HFA) 108 (90 Base) MCG/ACT inhaler, Inhale 2 puffs into the lungs every 6 (six) hours as needed for wheezing or shortness of breath., Disp: , Rfl:    amphetamine-dextroamphetamine (ADDERALL) 10 MG tablet, Take 1 tablet (10 mg total) by mouth daily  with breakfast., Disp: 90 tablet, Rfl: 0   buPROPion (WELLBUTRIN XL) 150 MG 24 hr tablet, Take 1 tablet (150 mg total) by mouth daily., Disp: 90 tablet, Rfl: 0   Calcium-Magnesium 250-125 MG TABS, Take 2 tablets by mouth daily., Disp: , Rfl:    cholecalciferol (VITAMIN D) 25 MCG (1000 UNIT) tablet, TAKE 1 TABLET EVERY DAY, Disp: 90 tablet, Rfl: 0   escitalopram (LEXAPRO) 10 MG tablet, TAKE 1 TABLET (10 MG TOTAL) BY MOUTH DAILY., Disp: 90 tablet, Rfl: 0   famotidine (PEPCID) 20 MG tablet, TAKE 1 TABLET AT BEDTIME, Disp: 90 tablet, Rfl: 1   levothyroxine (SYNTHROID) 100 MCG tablet, TAKE 1 TABLET EVERY DAY, Disp: 90 tablet, Rfl: 0   pantoprazole (PROTONIX) 40 MG tablet, TAKE 1 TABLET EVERY DAY, Disp: 90 tablet, Rfl: 1   PSYLLIUM HUSK PO, Take 2,000 mg  by mouth daily., Disp: , Rfl:    rosuvastatin (CRESTOR) 10 MG tablet, TAKE 1 TABLET EVERY DAY, Disp: 30 tablet, Rfl: 0   valACYclovir (VALTREX) 500 MG tablet, Take 500 mg by mouth as needed., Disp: , Rfl:   Allergies  Allergen Reactions   Pollen Extract Hives   Quinolones Other (See Comments)    Fluroquinolone antibiotics should be avoided in patients with aortic disease unless alternative therapy is not an option.    Reviewed their medical, surgical, family, social, medication, and allergy history and updated chart as appropriate.   Review of Systems Constitutional: -fever, -chills, -sweats, -unexpected weight change, -decreased appetite, -fatigue Allergy: -sneezing, -itching, -congestion Dermatology: -changing moles, --rash, -lumps ENT: -runny nose, -ear pain, -sore throat, -hoarseness, -sinus pain, -teeth pain, - ringing in ears, -hearing loss, -nosebleeds Cardiology: -chest pain, -palpitations, -swelling, -difficulty breathing when lying flat, -waking up short of breath Respiratory: -cough, -shortness of breath, -difficulty breathing with exercise or exertion, -wheezing, -coughing up blood Gastroenterology: -abdominal pain, -nausea, -vomiting, -diarrhea, -constipation, -blood in stool, -changes in bowel movement, -difficulty swallowing or eating Hematology: -bleeding, -bruising  Musculoskeletal: -joint aches, -muscle aches, -joint swelling, -back pain, -neck pain, -cramping, -changes in gait Ophthalmology: denies vision changes, eye redness, itching, discharge Urology: -burning with urination, -difficulty urinating, -blood in urine, -urinary frequency, -urgency, -incontinence Neurology: -headache, -weakness, -tingling, -numbness, -memory loss, -falls, -dizziness Psychology: -depressed mood, -agitation, -sleep problems Breast/gyn: -breast tendnerss, -discharge, -lumps, -vaginal discharge,- irregular periods, -heavy periods      06/02/2021    1:50 PM 03/06/2021    9:15 AM 12/09/2020     9:55 AM 08/20/2020   10:03 AM 02/20/2020    1:48 PM  Depression screen PHQ 2/9  Decreased Interest 0 0 0 0 0  Down, Depressed, Hopeless 0 0 0 0 2  PHQ - 2 Score 0 0 0 0 2  Altered sleeping     1  Tired, decreased energy     0  Change in appetite     3  Feeling bad or failure about yourself      1  Trouble concentrating     0  Moving slowly or fidgety/restless     0  Suicidal thoughts     0  PHQ-9 Score     7  Difficult doing work/chores     Very difficult       Objective:  BP 100/68   Pulse 60   Temp (!) 96.8 F (36 C)   Ht '4\' 11"'$  (1.499 m)   Wt 140 lb 6.4 oz (63.7 kg)   SpO2 98%   BMI 28.36 kg/m  General appearance: alert, no distress, WD/WN, Caucasian female Skin: unremarkable HEENT: normocephalic, conjunctiva/corneas normal, sclerae anicteric, PERRLA, EOMi, hearing aids present, nares patent, no discharge or erythema, pharynx normal Oral cavity: MMM, tongue normal, teeth in good repair Neck: supple, no lymphadenopathy, no thyromegaly, no masses, normal ROM, no bruits Chest: non tender, normal shape and expansion Heart: RRR, normal S1, S2, no murmurs Lungs: CTA bilaterally, no wheezes, rhonchi, or rales Abdomen: +bs, soft, non tender, non distended, no masses, no hepatomegaly, no splenomegaly, no bruits Back: non tender, normal ROM, no scoliosis Musculoskeletal: upper extremities non tender, no obvious deformity, normal ROM throughout, lower extremities non tender, no obvious deformity, normal ROM throughout Extremities: no edema, no cyanosis, no clubbing Pulses: 2+ symmetric, upper and lower extremities, normal cap refill Neurological: alert, oriented x 3, CN2-12 intact, strength normal upper extremities and lower extremities, sensation normal throughout, DTRs 2+ throughout, no cerebellar signs, gait normal Psychiatric: normal affect, behavior normal, pleasant  Breast/gyn/rectal - deferred to gynecology     Assessment and Plan :   Encounter Diagnoses  Name  Primary?   Acquired hypothyroidism Yes   Encounter for health maintenance examination in adult    Screen for colon cancer    Vitamin D deficiency, unspecified     Vaccine counseling    Uses hearing aid    Thoracic aortic aneurysm without rupture, unspecified part ([place])    Excessive sweating    Gastroesophageal reflux disease, unspecified whether esophagitis present    History of endometrial cancer    Hyperhidrosis    Mild intermittent asthma without complication    Pure hypercholesterolemia    S/P hysterectomy    Post-menopausal    Estrogen deficiency    Osteoporosis, unspecified osteoporosis type, unspecified pathological fracture presence    Screening for diabetes mellitus    Elevated glucose    Other abnormal glucose    Abnormal urinalysis    Attention deficit hyperactivity disorder (ADHD), unspecified ADHD type    Anxiety and depression      This visit was a preventative care visit, also known as wellness visit or routine physical.   Topics typically include healthy lifestyle, diet, exercise, preventative care, vaccinations, sick and well care, proper use of emergency dept and after hours care, as well as other concerns.     Recommendations: Continue to return yearly for your annual wellness and preventative care visits.  This gives [place] a chance to discuss healthy lifestyle, exercise, vaccinations, review your chart record, and perform screenings where appropriate.  I recommend you see your eye doctor yearly for routine vision care.  I recommend you see your dentist yearly for routine dental care including hygiene visits twice yearly.  See your gynecologist yearly for routine gynecological care.   Vaccination recommendations were reviewed Immunization History  Administered Date(s) Administered   Influenza, High Dose Seasonal PF 11/28/2019, 11/26/2020   Influenza-Unspecified 12/21/2016, 12/01/2018   PFIZER(Purple Top)SARS-COV-2 Vaccination 03/24/2019, 04/16/2019,  12/12/2019   PNEUMOCOCCAL CONJUGATE-20 03/06/2021   Pfizer Covid-19 Vaccine Bivalent Booster 19yr & up 11/26/2020   Pneumococcal Conjugate-13 02/06/2018   Tdap 06/02/2021   Zoster Recombinat (Shingrix) 12/01/2018, 05/08/2019   I recommend high dose flu and covid booster, hopefully available soon   Screening for cancer: Colon cancer screening: Referral back to GI for updated colonoscopy.   [facility] with recommended 5 years f/u  Breast cancer screening: You should perform a self breast exam monthly.   We reviewed recommendations for regular mammograms and breast cancer screening.  Cervical cancer screening: Followed by gynecology oncology given history of endometrial cancer  Skin cancer screening: Check your skin regularly for new changes, growing lesions, or other lesions of concern Come in for evaluation if you have skin lesions of concern.  Lung cancer screening: If you have a greater than 20 pack year history of tobacco use, then you may qualify for lung cancer screening with a chest CT scan.   Please call your insurance company to inquire about coverage for this test.  We currently don't have screenings for other cancers besides breast, cervical, colon, and lung cancers.  If you have a strong family history of cancer or have other cancer screening concerns, please let me know.    Bone health: Get at least 150 minutes of aerobic exercise weekly Get weight bearing exercise at least once weekly Bone density test:  A bone density test is an imaging test that uses a type of X-ray to measure the amount of calcium and other minerals in your bones. The test may be used to diagnose or screen you for a condition that causes weak or thin bones (osteoporosis), predict your risk for a broken bone (fracture), or determine how well your osteoporosis treatment is working. The bone density test is recommended for females 21 and older, or females or males <34 if  certain risk factors such as thyroid disease, long term use of steroids such as for asthma or rheumatological issues, vitamin D deficiency, estrogen deficiency, family history of osteoporosis, self or family history of fragility fracture in first degree relative.  I reviewed your 2022 study showing osteopenia, but given the pathological fracture in the past year, you are now considered osteoporotic.    I will print a list of medicaiton to consider to help manage osteoporosis.  Let me know how you want to proceed.   Heart health: Get at least 150 minutes of aerobic exercise weekly Limit alcohol It is important to maintain a healthy blood pressure and healthy cholesterol numbers  Heart disease screening: Screening for heart disease includes screening for blood pressure, fasting lipids, glucose/diabetes screening, BMI height to weight ratio, reviewed of smoking status, physical activity, and diet.    Goals include blood pressure 120/80 or less, maintaining a healthy lipid/cholesterol profile, preventing diabetes or keeping diabetes numbers under good control, not smoking or using tobacco products, exercising most days per week or at least 150 minutes per week of exercise, and eating healthy variety of fruits and vegetables, healthy oils, and avoiding unhealthy food choices like fried food, fast food, high sugar and high cholesterol foods.    Continue routine follow up with cardiology given history thoracic aortic aneurysm.   Medical care options: I recommend you continue to seek care here first for routine care.  We try really hard to have available appointments Monday through Friday daytime hours for sick visits, acute visits, and physicals.  Urgent care should be used for after hours and weekends for significant issues that cannot wait till the next day.  The emergency department should be used for significant potentially life-threatening emergencies.  The emergency department is expensive, can  often have long wait times for less significant concerns, so try to utilize primary care, urgent care, or telemedicine when possible to avoid unnecessary trips to the emergency department.  Virtual visits and telemedicine have been introduced since the pandemic started in 2020, and can be convenient ways to receive medical care.  We offer virtual appointments as well to assist you in a variety  of options to seek medical care.   Advanced Directives: I recommend you consider completing a [place] and Living Will.   These documents respect your wishes and help alleviate burdens on your loved ones if you were to become terminally ill or be in a position to need those documents enforced.    You can complete Advanced Directives yourself, have them notarized, then have copies made for our office, for you and for anybody you feel should have them in safe keeping.  Or, you can have an attorney prepare these documents.   If you haven't updated your Last Will and Testament in a while, it may be worthwhile having an attorney prepare these documents together and save on some costs.       Separate significant issues discussed: Hypo thyroidism-updated labs today, continue current medication  High cholesterol-continue Crestor 10 mg daily.  Await labs  Vitamin D deficiency -I will update labs today to help decide on dosing for supplement  Hyperhidrosis, sweating-we will check some other labs.  Your urine was abnormal today suggesting possible infection.  There are several potential causes of excess sweating including menopausal hormonal changes too  Abnormal urinalysis-I will send her urine for culture to see what type of bacteria might be growing  Acid reflux/GERD-when you see gastroenterology about colonoscopy, asked them whether he needs an updated endoscopy as well.  You are currently on famotidine and Protonix.  Protonix does increase your risk of osteoporosis similar to the fact that  you have estrogen deficiency and thyroid issues as well.    Hearing loss-doing okay on hearing aid  History of asthma no recent complaint.  Continue albuterol as needed  Depression and anxiety - seems to be doing ok on current medication  ADD - doing ok on curent therapy      Adel was seen today for annual exam.  Diagnoses and all orders for this visit:  Acquired hypothyroidism -     POCT Urinalysis DIP (Proadvantage Device) -     Comprehensive metabolic panel -     CBC with Differential/Platelet -     TSH + free T4  Encounter for health maintenance examination in adult  Screen for colon cancer -     Ambulatory referral to Gastroenterology  Vitamin D deficiency, unspecified  -     VITAMIN D 25 Hydroxy (Vit-D Deficiency, Fractures)  Vaccine counseling  Uses hearing aid  Thoracic aortic aneurysm without rupture, unspecified part ([place])  Excessive sweating  Gastroesophageal reflux disease, unspecified whether esophagitis present  History of endometrial cancer  Hyperhidrosis -     POCT Urinalysis DIP (Proadvantage Device) -     Comprehensive metabolic panel -     CBC with Differential/Platelet -     TSH + free T4 -     VITAMIN D 25 Hydroxy (Vit-D Deficiency, Fractures)  Mild intermittent asthma without complication  Pure hypercholesterolemia -     Lipid panel  S/P hysterectomy  Post-menopausal  Estrogen deficiency  Osteoporosis, unspecified osteoporosis type, unspecified pathological fracture presence  Screening for diabetes mellitus -     Hemoglobin A1c  Elevated glucose  Other abnormal glucose -     Hemoglobin A1c  Abnormal urinalysis -     Urine Culture  Attention deficit hyperactivity disorder (ADHD), unspecified ADHD type  Anxiety and depression  Spent > 45 minutes face to face with patient in discussion of symptoms, evaluation, plan and recommendations.    Follow-up pending labs, yearly for physical

## 2021-10-30 NOTE — Progress Notes (Deleted)
Patient: Jamie Singh   DOB: Jun 22, 1950   71 y.o. Female  MRN: [id number]  Subjective:    Chief Complaint  Patient presents with   Annual Exam    Fasting    Jamie Singh is a 71 y.o. female who presents today for a complete physical exam. She reports consuming a low fat, low sodium, and low carb.  diet. Gym/ health club routine includes light weights, low impact aerobics, and walking on track . She generally feels well. She reports sleeping well. She does have additional problems to discuss today.   Medical team: Dr. Jeral Pinch, gynecology oncology Dr. Mart Piggs and Dr. Frazier Butt, cardiothoracic surgery Dr. Dorna Leitz, [place] eye doctor Sees dentist [facility] dermatology Tysinger, Camelia Eng, PA-C here for primary care   Concerns: Has a lot of phlegm in the mornings for some reason  Fingernails have lot of ridges, been flaking.  Has had nail ridges fore ever, but flaking within last 6 months.  Thinks her thyroid is off.  Having a lot of sweating.  She continues to complain of excessive dripping sweat.  Compliant with thyroid medicaiton every morning.  Hearing loss - has hearing aids from [employer] that work well for her.   Was using collagen for nails and bones, but this worsened nausea again.  No current nausea problems.    Just saw cardiology recently for updated echocardiogram, hx/o thoracic aortic aneurysm.  Seeing orthopedics.  She dealt with a fracture this past year in the low back did not have a specific trigger although she thinks it was when she was using kettle bells for exercise.  Orthopedist felt like this now puts her in the osteoporotic category.  She is back to doing weightbearing exercise now after period of rest and rehab  She is compliant with her cholesterol medicine without complaint  She is compliant with Wellbutrin and Lexapro without complaint   Past Medical History:  Diagnosis Date   Asthma    mild, but prior on  preventative FLovent   Endometrial cancer ([place]) 2020   Genital herpes    GERD (gastroesophageal reflux disease)    Hyperlipidemia    Hypothyroidism    Thoracic aortic aneurysm ([place])    Wears hearing aid     Family History  Problem Relation Age of Onset   COPD Mother    Heart failure Mother    Macular degeneration Mother    Osteoporosis Mother    Cancer Mother        Mom needed a hysterectomy, thinks that she may have had uterine cancer   Heart disease Father        MI   COPD Father    Aortic aneurysm Father    Heart disease Brother        MI   Myelodysplastic syndrome Brother    Osteoporosis Maternal Grandmother    Heart failure Maternal Grandmother    Heart failure Maternal Grandfather    COPD Maternal Grandfather    Stroke Paternal Grandfather    Breast cancer Neg Hx    Colon cancer Neg Hx      Current Outpatient Medications:    albuterol (VENTOLIN HFA) 108 (90 Base) MCG/ACT inhaler, Inhale 2 puffs into the lungs every 6 (six) hours as needed for wheezing or shortness of breath., Disp: , Rfl:    amphetamine-dextroamphetamine (ADDERALL) 10 MG tablet, Take 1 tablet (10 mg total) by mouth daily with breakfast., Disp: 90 tablet, Rfl: 0   buPROPion (  WELLBUTRIN XL) 150 MG 24 hr tablet, Take 1 tablet (150 mg total) by mouth daily., Disp: 90 tablet, Rfl: 0   Calcium-Magnesium 250-125 MG TABS, Take 2 tablets by mouth daily., Disp: , Rfl:    cholecalciferol (VITAMIN D) 25 MCG (1000 UNIT) tablet, TAKE 1 TABLET EVERY DAY, Disp: 90 tablet, Rfl: 0   escitalopram (LEXAPRO) 10 MG tablet, TAKE 1 TABLET (10 MG TOTAL) BY MOUTH DAILY., Disp: 90 tablet, Rfl: 0   famotidine (PEPCID) 20 MG tablet, TAKE 1 TABLET AT BEDTIME, Disp: 90 tablet, Rfl: 1   levothyroxine (SYNTHROID) 100 MCG tablet, TAKE 1 TABLET EVERY DAY, Disp: 90 tablet, Rfl: 0   pantoprazole (PROTONIX) 40 MG tablet, TAKE 1 TABLET EVERY DAY, Disp: 90 tablet, Rfl: 1   PSYLLIUM HUSK PO, Take 2,000 mg by mouth daily., Disp: , Rfl:     rosuvastatin (CRESTOR) 10 MG tablet, TAKE 1 TABLET EVERY DAY, Disp: 30 tablet, Rfl: 0   valACYclovir (VALTREX) 500 MG tablet, Take 500 mg by mouth as needed., Disp: , Rfl:   Allergies  Allergen Reactions   Pollen Extract Hives   Quinolones Other (See Comments)    Fluroquinolone antibiotics should be avoided in patients with aortic disease unless alternative therapy is not an option.    Reviewed their medical, surgical, family, social, medication, and allergy history and updated chart as appropriate.   Review of Systems Constitutional: -fever, -chills, -sweats, -unexpected weight change, -decreased appetite, -fatigue Allergy: -sneezing, -itching, -congestion Dermatology: -changing moles, --rash, -lumps ENT: -runny nose, -ear pain, -sore throat, -hoarseness, -sinus pain, -teeth pain, - ringing in ears, -hearing loss, -nosebleeds Cardiology: -chest pain, -palpitations, -swelling, -difficulty breathing when lying flat, -waking up short of breath Respiratory: -cough, -shortness of breath, -difficulty breathing with exercise or exertion, -wheezing, -coughing up blood Gastroenterology: -abdominal pain, -nausea, -vomiting, -diarrhea, -constipation, -blood in stool, -changes in bowel movement, -difficulty swallowing or eating Hematology: -bleeding, -bruising  Musculoskeletal: -joint aches, -muscle aches, -joint swelling, -back pain, -neck pain, -cramping, -changes in gait Ophthalmology: denies vision changes, eye redness, itching, discharge Urology: -burning with urination, -difficulty urinating, -blood in urine, -urinary frequency, -urgency, -incontinence Neurology: -headache, -weakness, -tingling, -numbness, -memory loss, -falls, -dizziness Psychology: -depressed mood, -agitation, -sleep problems Breast/gyn: -breast tendnerss, -discharge, -lumps, -vaginal discharge,- irregular periods, -heavy periods      06/02/2021    1:50 PM 03/06/2021    9:15 AM 12/09/2020    9:55 AM 08/20/2020   10:03 AM  02/20/2020    1:48 PM  Depression screen PHQ 2/9  Decreased Interest 0 0 0 0 0  Down, Depressed, Hopeless 0 0 0 0 2  PHQ - 2 Score 0 0 0 0 2  Altered sleeping     1  Tired, decreased energy     0  Change in appetite     3  Feeling bad or failure about yourself      1  Trouble concentrating     0  Moving slowly or fidgety/restless     0  Suicidal thoughts     0  PHQ-9 Score     7  Difficult doing work/chores     Very difficult       Objective:  BP 100/68   Pulse 60   Temp (!) 96.8 F (36 C)   Ht '4\' 11"'$  (1.499 m)   Wt 140 lb 6.4 oz (63.7 kg)   SpO2 98%   BMI 28.36 kg/m   General appearance: alert, no distress, WD/WN, Caucasian female Skin: unremarkable  HEENT: normocephalic, conjunctiva/corneas normal, sclerae anicteric, PERRLA, EOMi, hearing aids present, nares patent, no discharge or erythema, pharynx normal Oral cavity: MMM, tongue normal, teeth in good repair Neck: supple, no lymphadenopathy, no thyromegaly, no masses, normal ROM, no bruits Chest: non tender, normal shape and expansion Heart: RRR, normal S1, S2, no murmurs Lungs: CTA bilaterally, no wheezes, rhonchi, or rales Abdomen: +bs, soft, non tender, non distended, no masses, no hepatomegaly, no splenomegaly, no bruits Back: non tender, normal ROM, no scoliosis Musculoskeletal: upper extremities non tender, no obvious deformity, normal ROM throughout, lower extremities non tender, no obvious deformity, normal ROM throughout Extremities: no edema, no cyanosis, no clubbing Pulses: 2+ symmetric, upper and lower extremities, normal cap refill Neurological: alert, oriented x 3, CN2-12 intact, strength normal upper extremities and lower extremities, sensation normal throughout, DTRs 2+ throughout, no cerebellar signs, gait normal Psychiatric: normal affect, behavior normal, pleasant  Breast/gyn/rectal - deferred to gynecology     Assessment and Plan :   Encounter Diagnoses  Name Primary?   Acquired hypothyroidism  Yes   Encounter for health maintenance examination in adult    Screen for colon cancer    Vitamin D deficiency, unspecified     Vaccine counseling    Uses hearing aid    Thoracic aortic aneurysm without rupture, unspecified part ([place])    Excessive sweating    Gastroesophageal reflux disease, unspecified whether esophagitis present    History of endometrial cancer    Hyperhidrosis    Mild intermittent asthma without complication    Pure hypercholesterolemia    S/P hysterectomy    Post-menopausal    Estrogen deficiency    Osteoporosis, unspecified osteoporosis type, unspecified pathological fracture presence    Screening for diabetes mellitus    Elevated glucose    Other abnormal glucose    Abnormal urinalysis    Attention deficit hyperactivity disorder (ADHD), unspecified ADHD type    Anxiety and depression      This visit was a preventative care visit, also known as wellness visit or routine physical.   Topics typically include healthy lifestyle, diet, exercise, preventative care, vaccinations, sick and well care, proper use of emergency dept and after hours care, as well as other concerns.     Recommendations: Continue to return yearly for your annual wellness and preventative care visits.  This gives [place] a chance to discuss healthy lifestyle, exercise, vaccinations, review your chart record, and perform screenings where appropriate.  I recommend you see your eye doctor yearly for routine vision care.  I recommend you see your dentist yearly for routine dental care including hygiene visits twice yearly.  See your gynecologist yearly for routine gynecological care.   Vaccination recommendations were reviewed Immunization History  Administered Date(s) Administered   Influenza, High Dose Seasonal PF 11/28/2019, 11/26/2020   Influenza-Unspecified 12/21/2016, 12/01/2018   PFIZER(Purple Top)SARS-COV-2 Vaccination 03/24/2019, 04/16/2019, 12/12/2019   PNEUMOCOCCAL CONJUGATE-20  03/06/2021   Pfizer Covid-19 Vaccine Bivalent Booster 26yr & up 11/26/2020   Pneumococcal Conjugate-13 02/06/2018   Tdap 06/02/2021   Zoster Recombinat (Shingrix) 12/01/2018, 05/08/2019   I recommend high dose flu and covid booster, hopefully available soon   Screening for cancer: Colon cancer screening: Referral back to GI for updated colonoscopy.   [facility]  Breast cancer screening: You should perform a self breast exam monthly.   We reviewed recommendations for regular mammograms and breast cancer screening.  Cervical cancer screening: Followed by gynecology oncology given history of  endometrial cancer  Skin cancer screening: Check your skin regularly for new changes, growing lesions, or other lesions of concern Come in for evaluation if you have skin lesions of concern.  Lung cancer screening: If you have a greater than 20 pack year history of tobacco use, then you may qualify for lung cancer screening with a chest CT scan.   Please call your insurance company to inquire about coverage for this test.  We currently don't have screenings for other cancers besides breast, cervical, colon, and lung cancers.  If you have a strong family history of cancer or have other cancer screening concerns, please let me know.    Bone health: Get at least 150 minutes of aerobic exercise weekly Get weight bearing exercise at least once weekly Bone density test:  A bone density test is an imaging test that uses a type of X-ray to measure the amount of calcium and other minerals in your bones. The test may be used to diagnose or screen you for a condition that causes weak or thin bones (osteoporosis), predict your risk for a broken bone (fracture), or determine how well your osteoporosis treatment is working. The bone density test is recommended for females 43 and older, or females or males <16 if certain risk factors such as thyroid  disease, long term use of steroids such as for asthma or rheumatological issues, vitamin D deficiency, estrogen deficiency, family history of osteoporosis, self or family history of fragility fracture in first degree relative.  I reviewed your 2022 study showing osteopenia, but given the pathological fracture in the past year, you are now considered osteoporotic.    I will print a list of medicaiton to consider to help manage osteoporosis.  Let me know how you want to proceed.   Heart health: Get at least 150 minutes of aerobic exercise weekly Limit alcohol It is important to maintain a healthy blood pressure and healthy cholesterol numbers  Heart disease screening: Screening for heart disease includes screening for blood pressure, fasting lipids, glucose/diabetes screening, BMI height to weight ratio, reviewed of smoking status, physical activity, and diet.    Goals include blood pressure 120/80 or less, maintaining a healthy lipid/cholesterol profile, preventing diabetes or keeping diabetes numbers under good control, not smoking or using tobacco products, exercising most days per week or at least 150 minutes per week of exercise, and eating healthy variety of fruits and vegetables, healthy oils, and avoiding unhealthy food choices like fried food, fast food, high sugar and high cholesterol foods.    Continue routine follow up with cardiology given history thoracic aortic aneurysm.   Medical care options: I recommend you continue to seek care here first for routine care.  We try really hard to have available appointments Monday through Friday daytime hours for sick visits, acute visits, and physicals.  Urgent care should be used for after hours and weekends for significant issues that cannot wait till the next day.  The emergency department should be used for significant potentially life-threatening emergencies.  The emergency department is expensive, can often have long wait times for less  significant concerns, so try to utilize primary care, urgent care, or telemedicine when possible to avoid unnecessary trips to the emergency department.  Virtual visits and telemedicine have been introduced since the pandemic started in 2020, and can be convenient ways to receive medical care.  We offer virtual appointments as well to assist you in a variety of options to seek medical care.   Advanced Directives:  I recommend you consider completing a [place] and Living Will.   These documents respect your wishes and help alleviate burdens on your loved ones if you were to become terminally ill or be in a position to need those documents enforced.    You can complete Advanced Directives yourself, have them notarized, then have copies made for our office, for you and for anybody you feel should have them in safe keeping.  Or, you can have an attorney prepare these documents.   If you haven't updated your Last Will and Testament in a while, it may be worthwhile having an attorney prepare these documents together and save on some costs.       Separate significant issues discussed: Hypo thyroidism-updated labs today, continue current medication  High cholesterol-continue Crestor 10 mg daily.  Await labs  Vitamin D deficiency -I will update labs today to help decide on dosing for supplement  Hyperhidrosis, sweating-we will check some other labs.  Your urine was abnormal today suggesting possible infection.  There are several potential causes of excess sweating including menopausal hormonal changes too  Abnormal urinalysis-I will send her urine for culture to see what type of bacteria might be growing  Acid reflux/GERD-when you see gastroenterology about colonoscopy, asked them whether he needs an updated endoscopy as well.  You are currently on famotidine and Protonix.  Protonix does increase your risk of osteoporosis similar to the fact that you have estrogen deficiency and  thyroid issues as well.    Hearing loss-doing okay on hearing aid  History of asthma no recent complaint.  Continue albuterol as needed  Depression and anxiety - seems to be doing ok on current medication  ADD - doing ok on curent therapy      Reve was seen today for annual exam.  Diagnoses and all orders for this visit:  Acquired hypothyroidism -     POCT Urinalysis DIP (Proadvantage Device) -     Comprehensive metabolic panel -     CBC with Differential/Platelet -     TSH + free T4  Encounter for health maintenance examination in adult  Screen for colon cancer -     Ambulatory referral to Gastroenterology  Vitamin D deficiency, unspecified  -     VITAMIN D 25 Hydroxy (Vit-D Deficiency, Fractures)  Vaccine counseling  Uses hearing aid  Thoracic aortic aneurysm without rupture, unspecified part ([place])  Excessive sweating  Gastroesophageal reflux disease, unspecified whether esophagitis present  History of endometrial cancer  Hyperhidrosis -     POCT Urinalysis DIP (Proadvantage Device) -     Comprehensive metabolic panel -     CBC with Differential/Platelet -     TSH + free T4 -     VITAMIN D 25 Hydroxy (Vit-D Deficiency, Fractures)  Mild intermittent asthma without complication  Pure hypercholesterolemia -     Lipid panel  S/P hysterectomy  Post-menopausal  Estrogen deficiency  Osteoporosis, unspecified osteoporosis type, unspecified pathological fracture presence  Screening for diabetes mellitus -     Hemoglobin A1c  Elevated glucose  Other abnormal glucose -     Hemoglobin A1c  Abnormal urinalysis -     Urine Culture  Attention deficit hyperactivity disorder (ADHD), unspecified ADHD type  Anxiety and depression  Spent > 45 minutes face to face with patient in discussion of symptoms, evaluation, plan and recommendations.    Follow-up pending labs, yearly for physical

## 2021-10-31 LAB — COMPREHENSIVE METABOLIC PANEL
ALT: 18 IU/L (ref 0–32)
AST: 23 IU/L (ref 0–40)
Albumin/Globulin Ratio: 2 (ref 1.2–2.2)
Albumin: 4.5 g/dL (ref 3.9–4.9)
Alkaline Phosphatase: 105 IU/L (ref 44–121)
BUN/Creatinine Ratio: 15 (ref 12–28)
BUN: 15 mg/dL (ref 8–27)
Bilirubin Total: 0.4 mg/dL (ref 0.0–1.2)
CO2: 24 mmol/L (ref 20–29)
Calcium: 9.9 mg/dL (ref 8.7–10.3)
Chloride: 104 mmol/L (ref 96–106)
Creatinine, Ser: 0.97 mg/dL (ref 0.57–1.00)
Globulin, Total: 2.3 g/dL (ref 1.5–4.5)
Glucose: 101 mg/dL — ABNORMAL HIGH (ref 70–99)
Potassium: 5.4 mmol/L — ABNORMAL HIGH (ref 3.5–5.2)
Sodium: 140 mmol/L (ref 134–144)
Total Protein: 6.8 g/dL (ref 6.0–8.5)
eGFR: 63 mL/min/{1.73_m2} (ref >59)

## 2021-10-31 LAB — CBC WITH DIFFERENTIAL/PLATELET
Basophils Absolute: 0.1 10*3/uL (ref 0.0–0.2)
Basos: 1 %
EOS (ABSOLUTE): 0.2 10*3/uL (ref 0.0–0.4)
Eos: 3 %
Hematocrit: 40.7 % (ref 34.0–46.6)
Hemoglobin: 13.1 g/dL (ref 11.1–15.9)
Immature Grans (Abs): 0 10*3/uL (ref 0.0–0.1)
Immature Granulocytes: 0 %
Lymphocytes Absolute: 1.4 10*3/uL (ref 0.7–3.1)
Lymphs: 21 %
MCH: 29.2 pg (ref 26.6–33.0)
MCHC: 32.2 g/dL (ref 31.5–35.7)
MCV: 91 fL (ref 79–97)
Monocytes Absolute: 0.6 10*3/uL (ref 0.1–0.9)
Monocytes: 9 %
Neutrophils Absolute: 4.4 10*3/uL (ref 1.4–7.0)
Neutrophils: 66 %
Platelets: 245 10*3/uL (ref 150–450)
RBC: 4.49 x10E6/uL (ref 3.77–5.28)
RDW: 13.1 % (ref 11.7–15.4)
WBC: 6.6 10*3/uL (ref 3.4–10.8)

## 2021-10-31 LAB — TSH+FREE T4
Free T4: 1.35 ng/dL (ref 0.82–1.77)
TSH: 0.541 u[IU]/mL (ref 0.450–4.500)

## 2021-10-31 LAB — LIPID PANEL
Chol/HDL Ratio: 2.4 ratio (ref 0.0–4.4)
Cholesterol, Total: 185 mg/dL (ref 100–199)
HDL: 78 mg/dL (ref >39)
LDL Chol Calc (NIH): 94 mg/dL (ref 0–99)
Triglycerides: 73 mg/dL (ref 0–149)
VLDL Cholesterol Cal: 13 mg/dL (ref 5–40)

## 2021-10-31 LAB — HEMOGLOBIN A1C
Est. average glucose Bld gHb Est-mCnc: 117 mg/dL
Hgb A1c MFr Bld: 5.7 % — ABNORMAL HIGH (ref 4.8–5.6)

## 2021-10-31 LAB — VITAMIN D 25 HYDROXY (VIT D DEFICIENCY, FRACTURES): Vit D, 25-Hydroxy: 40.1 ng/mL (ref 30.0–100.0)

## 2021-11-01 ENCOUNTER — Other Ambulatory Visit: Payer: Self-pay | Admitting: Medical

## 2021-11-03 ENCOUNTER — Other Ambulatory Visit: Payer: Self-pay | Admitting: Medical

## 2021-11-03 LAB — URINE CULTURE

## 2021-11-03 MED ORDER — NITROFURANTOIN MONOHYD MACRO 100 MG PO CAPS: 100.0000 mg | ORAL_CAPSULE | Freq: Two times a day (BID) | ORAL | 0 refills | Status: DC

## 2021-11-04 ENCOUNTER — Encounter: Payer: Self-pay | Admitting: Internal Medicine

## 2021-11-04 DIAGNOSIS — R8290 Unspecified abnormal findings in urine: Secondary | ICD-10-CM

## 2021-11-13 ENCOUNTER — Other Ambulatory Visit: Payer: Self-pay | Admitting: Medical

## 2021-11-16 ENCOUNTER — Other Ambulatory Visit: Payer: Self-pay | Admitting: Medical

## 2021-11-16 MED ORDER — AMPHETAMINE-DEXTROAMPHETAMINE 10 MG PO TABS: 10.0000 mg | ORAL_TABLET | Freq: Every day | ORAL | 0 refills | Status: DC

## 2021-11-17 ENCOUNTER — Ambulatory Visit: Payer: Medicare Other

## 2021-11-17 ENCOUNTER — Other Ambulatory Visit: Payer: Self-pay | Admitting: Internal Medicine

## 2021-11-17 DIAGNOSIS — R8290 Unspecified abnormal findings in urine: Secondary | ICD-10-CM

## 2021-11-17 DIAGNOSIS — R3129 Other microscopic hematuria: Secondary | ICD-10-CM

## 2021-11-17 LAB — POCT URINALYSIS DIP (PROADVANTAGE DEVICE)
Glucose, UA: NEGATIVE mg/dL
Leukocytes, UA: NEGATIVE
Nitrite, UA: NEGATIVE
Specific Gravity, Urine: 1.02
Urobilinogen, Ur: NEGATIVE
pH, UA: 6 (ref 5.0–8.0)

## 2021-11-18 ENCOUNTER — Other Ambulatory Visit: Payer: Medicare Other

## 2021-11-18 DIAGNOSIS — R3129 Other microscopic hematuria: Secondary | ICD-10-CM

## 2021-11-19 LAB — URINALYSIS, MICROSCOPIC ONLY
Bacteria, UA: NONE SEEN
Casts: NONE SEEN /lpf
Epithelial Cells (non renal): NONE SEEN /hpf (ref 0–10)
RBC, Urine: NONE SEEN /hpf (ref 0–2)
WBC, UA: NONE SEEN /hpf (ref 0–5)

## 2021-11-23 ENCOUNTER — Telehealth: Payer: Self-pay | Admitting: Internal Medicine

## 2021-11-23 ENCOUNTER — Ambulatory Visit: Payer: Medicare Other

## 2021-11-23 NOTE — Telephone Encounter (Signed)
Good Morning Dr.Dorsey,  Supervising MD 9/1 AM  We received a referral for this patient for a screening colonoscopy. Patient is a previous patient at [facility] in [place].  Patient is requesting her care at [facility] due to her living in [place] now. Records for review are available in Epic.  Please advise on scheduling. Thank you.

## 2021-11-25 ENCOUNTER — Encounter: Payer: Self-pay | Admitting: Internal Medicine

## 2021-11-25 NOTE — Telephone Encounter (Signed)
Patient has been scheduled for direct colonoscopy

## 2021-12-08 ENCOUNTER — Encounter: Payer: Self-pay | Admitting: Internal Medicine

## 2021-12-11 ENCOUNTER — Ambulatory Visit (AMBULATORY_SURGERY_CENTER): Payer: Self-pay

## 2021-12-11 VITALS — Ht 59.0 in | Wt 142.0 lb

## 2021-12-11 DIAGNOSIS — Z86010 Personal history of colonic polyps: Secondary | ICD-10-CM

## 2021-12-11 MED ORDER — NA SULFATE-K SULFATE-MG SULF 17.5-3.13-1.6 GM/177ML PO SOLN: 1.0000 | Freq: Once | ORAL | 0 refills | Status: AC

## 2021-12-11 NOTE — Progress Notes (Signed)
No egg or soy allergy known to patient  No issues known to pt with past sedation with any surgeries or procedures Patient denies ever being told they had issues or difficulty with intubation  No FH of Malignant Hyperthermia Pt is not on diet pills Pt is not on  home 02  Pt is not on blood thinners  Pt denies issues with constipation  No A fib or A flutter Have any cardiac testing pending--no Pt instructed to use [email or web address] or GoodRx for a price reduction on prep   

## 2021-12-23 ENCOUNTER — Ambulatory Visit
Admission: RE | Admit: 2021-12-23 | Discharge: 2021-12-23 | Disposition: A | Discharge status: Home / Self Care | Payer: Medicare Other | Source: Ambulatory Visit | Attending: Medical | Admitting: Medical

## 2021-12-23 DIAGNOSIS — Z1231 Encounter for screening mammogram for malignant neoplasm of breast: Secondary | ICD-10-CM

## 2021-12-31 ENCOUNTER — Encounter: Payer: Self-pay | Admitting: Internal Medicine

## 2021-12-31 ENCOUNTER — Ambulatory Visit (AMBULATORY_SURGERY_CENTER): Payer: Medicare Other | Admitting: Internal Medicine

## 2021-12-31 VITALS — BP 137/80 | HR 61 | Temp 96.9°F | Resp 14 | Ht 59.0 in | Wt 142.0 lb

## 2021-12-31 DIAGNOSIS — Z86010 Personal history of colonic polyps: Secondary | ICD-10-CM | POA: Diagnosis not present

## 2021-12-31 DIAGNOSIS — D123 Benign neoplasm of transverse colon: Secondary | ICD-10-CM

## 2021-12-31 DIAGNOSIS — Z09 Encounter for follow-up examination after completed treatment for conditions other than malignant neoplasm: Secondary | ICD-10-CM | POA: Diagnosis not present

## 2021-12-31 MED ORDER — SODIUM CHLORIDE 0.9 % IV SOLN: 500.0000 mL | Freq: Once | INTRAVENOUS | Status: DC

## 2021-12-31 NOTE — Progress Notes (Signed)
Report to pacu RN. VSS. Care resumed by RN.

## 2021-12-31 NOTE — Patient Instructions (Addendum)
Resume previous medications.  2 polyps removed and sent to pathology. Await results for final recommendations.    Handouts on findings given to patient.  (Polyps, hemorrhoids, diverticulosis)  YOU HAD AN ENDOSCOPIC PROCEDURE TODAY AT [facility]:   Refer to the procedure report that was given to you for any specific questions about what was found during the examination.  If the procedure report does not answer your questions, please call your gastroenterologist to clarify.  If you requested that your care partner not be given the details of your procedure findings, then the procedure report has been included in a sealed envelope for you to review at your convenience later.  YOU SHOULD EXPECT: Some feelings of bloating in the abdomen. Passage of more gas than usual.  Walking can help get rid of the air that was put into your GI tract during the procedure and reduce the bloating. If you had a lower endoscopy (such as a colonoscopy or flexible sigmoidoscopy) you may notice spotting of blood in your stool or on the toilet paper. If you underwent a bowel prep for your procedure, you may not have a normal bowel movement for a few days.  Please Note:  You might notice some irritation and congestion in your nose or some drainage.  This is from the oxygen used during your procedure.  There is no need for concern and it should clear up in a day or so.  SYMPTOMS TO REPORT IMMEDIATELY:  Following lower endoscopy (colonoscopy or flexible sigmoidoscopy):  Excessive amounts of blood in the stool  Significant tenderness or worsening of abdominal pains  Swelling of the abdomen that is new, acute  Fever of 100F or higher   For urgent or emergent issues, a gastroenterologist can be reached at any hour by calling [phone]. Do not use MyChart messaging for urgent concerns.    DIET:  We do recommend a small meal at first, but then you may proceed to your regular diet.  Drink plenty of fluids  but you should avoid alcoholic beverages for 24 hours.  ACTIVITY:  You should plan to take it easy for the rest of today and you should NOT DRIVE or use heavy machinery until tomorrow (because of the sedation medicines used during the test).    FOLLOW UP: Our staff will call the number listed on your records the next business day following your procedure.  We will call around 7:15- 8:00 am to check on you and address any questions or concerns that you may have regarding the information given to you following your procedure. If we do not reach you, we will leave a message.     If any biopsies were taken you will be contacted by phone or by letter within the next 1-3 weeks.  Please call us at [phone] if you have not heard about the biopsies in 3 weeks.    SIGNATURES/CONFIDENTIALITY: You and/or your care partner have signed paperwork which will be entered into your electronic medical record.  These signatures attest to the fact that that the information above on your After Visit Summary has been reviewed and is understood.  Full responsibility of the confidentiality of this discharge information lies with you and/or your care-partner.

## 2021-12-31 NOTE — Op Note (Signed)
[facility] Patient Name: Jamie Singh Procedure Date: 12/31/2021 8:06 AM MRN: [id number] Endoscopist: Georgian Co , , [id number] Age: 71 Referring MD:  Date of Birth: 1950-04-20 Gender: Female Account #: [id number] Procedure:                Colonoscopy Indications:              High risk colon cancer surveillance: Personal                            history of colonic polyps Medicines:                Monitored Anesthesia Care Procedure:                Pre-Anesthesia Assessment:                           - Prior to the procedure, a History and Physical                            was performed, and patient medications and                            allergies were reviewed. The patient's tolerance of                            previous anesthesia was also reviewed. The risks                            and benefits of the procedure and the sedation                            options and risks were discussed with the patient.                            All questions were answered, and informed consent                            was obtained. Prior Anticoagulants: The patient has                            taken no anticoagulant or antiplatelet agents. ASA                            Grade Assessment: II - A patient with mild systemic                            disease. After reviewing the risks and benefits,                            the patient was deemed in satisfactory condition to                            undergo the procedure.  After obtaining informed consent, the colonoscope                            was passed under direct vision. Throughout the                            procedure, the patient's blood pressure, pulse, and                            oxygen saturations were monitored continuously. The                            Olympus PCF-[id number]) Colonoscope was                            introduced through the anus and  advanced to the the                            cecum, identified by appendiceal orifice and                            ileocecal valve. The colonoscopy was performed                            without difficulty. The patient tolerated the                            procedure well. The quality of the bowel                            preparation was good. The ileocecal valve,                            appendiceal orifice, and rectum were photographed. Scope In: 8:18:36 AM Scope Out: 8:45:25 AM Scope Withdrawal Time: 0 hours 20 minutes 3 seconds  Total Procedure Duration: 0 hours 26 minutes 49 seconds  Findings:                 Multiple diverticula were found in the sigmoid                            colon, transverse colon and ascending colon.                           Two sessile polyps were found in the transverse                            colon. The polyps were 4 to 8 mm in size. These                            polyps were removed with a cold snare. Resection                            and retrieval were complete.  Non-bleeding internal hemorrhoids were found during                            retroflexion. Complications:            No immediate complications. Estimated Blood Loss:     Estimated blood loss was minimal. Impression:               - Diverticulosis in the sigmoid colon, in the                            transverse colon and in the ascending colon.                           - Two 4 to 8 mm polyps in the transverse colon,                            removed with a cold snare. Resected and retrieved.                           - Non-bleeding internal hemorrhoids. Recommendation:           - Discharge patient to home (with escort).                           - Await pathology results.                           - The findings and recommendations were discussed                            with the patient. Dr Georgian Co "Lyndee Leo" Lorenso Courier,   12/31/2021 8:56:15 AM

## 2021-12-31 NOTE — Progress Notes (Signed)
GASTROENTEROLOGY PROCEDURE H&P NOTE   Primary Care Physician: Carlena Hurl, PA-C    Reason for Procedure:   History of colon polyps  Plan:    Colonoscopy  Patient is appropriate for endoscopic procedure(s) in the ambulatory ([facility]) setting.  The nature of the procedure, as well as the risks, benefits, and alternatives were carefully and thoroughly reviewed with the patient. Ample time for discussion and questions allowed. The patient understood, was satisfied, and agreed to proceed.     HPI: Jamie Singh is a 71 y.o. female who presents for colonoscopy for history of colon polyps. Denies blood in stools, changes in bowel habits, unintentional weight loss. Denies family history of colon cancer.  Past Medical History:  Diagnosis Date   ADHD    Allergy    Anxiety and depression    Aortic aneurysm (HCC)    Asthma    mild, but prior on preventative FLovent   Endometrial cancer ([facility]) 2020   Genital herpes    GERD (gastroesophageal reflux disease)    Hyperlipidemia    Hypothyroidism    Osteoporosis    Thoracic aortic aneurysm ([place])    Wears hearing aid     Past Surgical History:  Procedure Laterality Date   COLONOSCOPY  2019   ESOPHAGOGASTRODUODENOSCOPY ENDOSCOPY     ROBOTIC ASSISTED TOTAL HYSTERECTOMY WITH BILATERAL SALPINGO OOPHERECTOMY     RA-TLH/BSO   TONSILLECTOMY AND ADENOIDECTOMY     WRIST FRACTURE SURGERY Right     Prior to Admission medications   Medication Sig Start Date End Date Taking? Authorizing Provider  albuterol (VENTOLIN HFA) 108 (90 Base) MCG/ACT inhaler Inhale 2 puffs into the lungs every 6 (six) hours as needed for wheezing or shortness of breath.   Yes [provider]  amphetamine-dextroamphetamine (ADDERALL) 10 MG tablet Take 1 tablet (10 mg total) by mouth daily with breakfast. 11/16/21  Yes Tysinger, Camelia Eng, PA-C  buPROPion (WELLBUTRIN XL) 150 MG 24 hr tablet Take 1 tablet (150 mg total) by mouth daily. 09/22/21  Yes Tysinger,  Camelia Eng, PA-C  cholecalciferol (VITAMIN D) 25 MCG (1000 UNIT) tablet TAKE 1 TABLET EVERY DAY 09/03/21  Yes Tysinger, Camelia Eng, PA-C  escitalopram (LEXAPRO) 10 MG tablet TAKE 1 TABLET (10 MG TOTAL) BY MOUTH DAILY. 01/12/21  Yes Tysinger, Camelia Eng, PA-C  famotidine (PEPCID) 20 MG tablet TAKE 1 TABLET AT BEDTIME 06/22/21  Yes Tysinger, Camelia Eng, PA-C  levothyroxine (SYNTHROID) 100 MCG tablet TAKE 1 TABLET EVERY DAY 11/03/21  Yes Tysinger, Camelia Eng, PA-C  pantoprazole (PROTONIX) 40 MG tablet TAKE 1 TABLET EVERY DAY 06/22/21  Yes Tysinger, Camelia Eng, PA-C  PSYLLIUM HUSK PO Take 2,000 mg by mouth daily.   Yes [provider]  rosuvastatin (CRESTOR) 10 MG tablet TAKE 1 TABLET EVERY DAY 11/13/21  Yes Tysinger, Camelia Eng, PA-C  Calcium-Magnesium 250-125 MG TABS Take 2 tablets by mouth daily.    [provider]  nitrofurantoin, macrocrystal-monohydrate, (MACROBID) 100 MG capsule Take 1 capsule (100 mg total) by mouth 2 (two) times daily. 11/03/21   Tysinger, Camelia Eng, PA-C  valACYclovir (VALTREX) 500 MG tablet Take 500 mg by mouth as needed.    [provider]    Current Outpatient Medications  Medication Sig Dispense Refill   albuterol (VENTOLIN HFA) 108 (90 Base) MCG/ACT inhaler Inhale 2 puffs into the lungs every 6 (six) hours as needed for wheezing or shortness of breath.     amphetamine-dextroamphetamine (ADDERALL) 10 MG tablet Take 1 tablet (10 mg  total) by mouth daily with breakfast. 90 tablet 0   buPROPion (WELLBUTRIN XL) 150 MG 24 hr tablet Take 1 tablet (150 mg total) by mouth daily. 90 tablet 0   cholecalciferol (VITAMIN D) 25 MCG (1000 UNIT) tablet TAKE 1 TABLET EVERY DAY 90 tablet 0   escitalopram (LEXAPRO) 10 MG tablet TAKE 1 TABLET (10 MG TOTAL) BY MOUTH DAILY. 90 tablet 0   famotidine (PEPCID) 20 MG tablet TAKE 1 TABLET AT BEDTIME 90 tablet 1   levothyroxine (SYNTHROID) 100 MCG tablet TAKE 1 TABLET EVERY DAY 90 tablet 2   pantoprazole (PROTONIX) 40 MG tablet TAKE 1 TABLET EVERY  DAY 90 tablet 1   PSYLLIUM HUSK PO Take 2,000 mg by mouth daily.     rosuvastatin (CRESTOR) 10 MG tablet TAKE 1 TABLET EVERY DAY 60 tablet 0   Calcium-Magnesium 250-125 MG TABS Take 2 tablets by mouth daily.     nitrofurantoin, macrocrystal-monohydrate, (MACROBID) 100 MG capsule Take 1 capsule (100 mg total) by mouth 2 (two) times daily. 14 capsule 0   valACYclovir (VALTREX) 500 MG tablet Take 500 mg by mouth as needed.     Current Facility-Administered Medications  Medication Dose Route Frequency Provider Last Rate Last Admin   0.9 %  sodium chloride infusion  500 mL Intravenous Once Sharyn Creamer, MD        Allergies as of 12/31/2021 - Review Complete 12/31/2021  Allergen Reaction Noted   Pollen extract Hives 01/01/2019   Quinolones Other (See Comments) 06/26/2021    Family History  Problem Relation Age of Onset   COPD Mother    Heart failure Mother    Macular degeneration Mother    Osteoporosis Mother    Cancer Mother        Mom needed a hysterectomy, thinks that she may have had uterine cancer   Heart disease Father        MI   COPD Father    Aortic aneurysm Father    Heart disease Brother        MI   Myelodysplastic syndrome Brother    Osteoporosis Maternal Grandmother    Heart failure Maternal Grandmother    Heart failure Maternal Grandfather    COPD Maternal Grandfather    Stroke Paternal Grandfather    Breast cancer Neg Hx    Colon cancer Neg Hx    Colon polyps Neg Hx    Esophageal cancer Neg Hx    Rectal cancer Neg Hx    Stomach cancer Neg Hx     Social History   Socioeconomic History   Marital status: Married    Spouse name: Not on file   Number of children: Not on file   Years of education: Not on file   Highest education level: Bachelor's degree (e.g., BA, AB, BS)  Occupational History   Occupation: Environmental education officer - retired  Tobacco Use   Smoking status: Never   Smokeless tobacco: Never  Vaping Use   Vaping Use: Never used  Substance and Sexual  Activity   Alcohol use: Not Currently    Alcohol/week: 2.0 - 3.0 standard drinks of alcohol    Types: 2 - 3 Glasses of wine per week    Comment: 2-4 times monthly   Drug use: Yes    Types: Marijuana    Comment: gummies   Sexual activity: Not Currently  Other Topics Concern   Not on file  Social History Narrative   Lives with husband and dog.  Hx/o journalism career, English as a second language teacher.  09/2021   Social Determinants of Health   Financial Resource Strain: Low Risk  (06/01/2021)   Overall Financial Resource Strain (CARDIA)    Difficulty of Paying Living Expenses: Not hard at all  Food Insecurity: No Food Insecurity (06/01/2021)   Hunger Vital Sign    Worried About Running Out of Food in the Last Year: Never true    Ran Out of Food in the Last Year: Never true  Transportation Needs: No Transportation Needs (06/01/2021)   PRAPARE - Hydrologist (Medical): No    Lack of Transportation (Non-Medical): No  Physical Activity: Sufficiently Active (06/01/2021)   Exercise Vital Sign    Days of Exercise per Week: 4 days    Minutes of Exercise per Session: 60 min  Stress: Stress Concern Present (06/01/2021)   [place]    Feeling of Stress : To some extent  Social Connections: Moderately Isolated (06/01/2021)   Social Connection and Isolation Panel [NHANES]    Frequency of Communication with Friends and Family: More than three times a week    Frequency of Social Gatherings with Friends and Family: Once a week    Attends Religious Services: Never    Marine scientist or Organizations: No    Attends Music therapist: Not on file    Marital Status: Married  Human resources officer Violence: Not on file    Physical Exam: Vital signs in last 24 hours: BP 127/76   Pulse 67   Temp (!) 96.9 F (36.1 C) (Temporal)   Resp 15   Ht '4\' 11"'$  (1.499 m)   Wt 142 lb (64.4 kg)   SpO2 98%   BMI 28.68 kg/m  GEN:  NAD EYE: Sclerae anicteric ENT: MMM CV: Non-tachycardic Pulm: No increased work of breathing GI: Soft, NT/ND NEURO:  Alert & Oriented   Christia Reading, MD [facility]  12/31/2021 8:15 AM

## 2021-12-31 NOTE — Progress Notes (Signed)
Vitals-CW  Patient states no changes from office visit/pre-visit.

## 2022-01-01 ENCOUNTER — Telehealth: Payer: Self-pay

## 2022-01-01 NOTE — Telephone Encounter (Signed)
  Follow up Call-     12/31/2021    7:23 AM 12/31/2021    7:19 AM  Call back number  Post procedure Call Back phone  # [phone]   Permission to leave phone message  Yes     Patient questions:  Do you have a fever, pain , or abdominal swelling? No. Pain Score  0 *  Have you tolerated food without any problems? Yes.    Have you been able to return to your normal activities? Yes.    Do you have any questions about your discharge instructions: Diet   No. Medications  No. Follow up visit  No.  Do you have questions or concerns about your Care? No.  Actions: * If pain score is 4 or above: No action needed, pain <4.

## 2022-01-05 ENCOUNTER — Encounter: Payer: Self-pay | Admitting: Internal Medicine

## 2022-01-12 ENCOUNTER — Encounter: Payer: Self-pay | Admitting: Internal Medicine

## 2022-01-13 ENCOUNTER — Telehealth: Payer: Self-pay

## 2022-01-13 ENCOUNTER — Other Ambulatory Visit: Payer: Self-pay | Admitting: Medical

## 2022-01-13 MED ORDER — ALBUTEROL SULFATE HFA 108 (90 BASE) MCG/ACT IN AERS: 2.0000 | INHALATION_SPRAY | Freq: Four times a day (QID) | RESPIRATORY_TRACT | 0 refills | Status: DC | PRN

## 2022-01-13 NOTE — Telephone Encounter (Signed)
Received fax from [facility] home health for a refill on Proair inhaler. Last apt was 10/30/21 next apt 05/15/22.

## 2022-01-25 ENCOUNTER — Other Ambulatory Visit: Payer: Self-pay | Admitting: Medical

## 2022-01-29 ENCOUNTER — Ambulatory Visit (INDEPENDENT_AMBULATORY_CARE_PROVIDER_SITE_OTHER): Payer: Medicare Other | Admitting: Medical

## 2022-01-29 VITALS — BP 120/70 | HR 69 | Temp 98.6°F | Wt 141.8 lb

## 2022-01-29 DIAGNOSIS — J3489 Other specified disorders of nose and nasal sinuses: Secondary | ICD-10-CM

## 2022-01-29 DIAGNOSIS — J988 Other specified respiratory disorders: Secondary | ICD-10-CM

## 2022-01-29 DIAGNOSIS — R051 Acute cough: Secondary | ICD-10-CM

## 2022-01-29 MED ORDER — HYDROCOD POLI-CHLORPHE POLI ER 10-8 MG/5ML PO SUER: 5.0000 mL | Freq: Two times a day (BID) | ORAL | 0 refills | Status: DC

## 2022-01-29 MED ORDER — AMOXICILLIN 875 MG PO TABS: 875.0000 mg | ORAL_TABLET | Freq: Two times a day (BID) | ORAL | 0 refills | Status: AC

## 2022-01-29 NOTE — Patient Instructions (Signed)
Encounter Diagnoses  Name Primary?   Respiratory tract infection Yes   Acute cough    Sinus pressure     Recommendations: 1-continue to hydrate well with water throughout the day.  Can also do soup broth or other clear liquids. 2-continue Mucinex DM for cough and congestion 3-I did send a stronger cough medicine called Tussionex if you want to try this instead for the next few days.  Caution this can cause sedation 4-you can use salt water gargles and warm fluids for any throat pain, Tylenol or short-term over-the-counter pain ibuprofen for fever or not feeling well 5 - In the event you continue to feel the same through the weekend and not improving particularly with worse sinus pressure or thick nasal mucus you can begin amoxicillin.  I sent this to the pharmacy but I put on the label, hold for patient request 6-use your inhaler as needed 2 puffs at least twice daily if feeling short of breath or wheezing If you are much worse over the weekend or new or different symptoms such as shortness of breath or uncontrolled vomiting or other then call back or get reevaluated

## 2022-01-29 NOTE — Progress Notes (Signed)
Subjective:  Jamie Singh is a 71 y.o. female who presents for Chief Complaint  Patient presents with   Sore Throat    Sore throat pain 5-7/10, sinus drainage, some cough, covid test negative since Monday.      Here for illness, started 4 days ago.  symptoms include cough, sinus pressure, sore throat, coughing some, but not a lot.   Some congested.  No NVD.  No SOB or wheezing.  Had negative covid test this morning at home.   No fever, no body aches or chills.  No sick contacts.   Using ibuprofen for sore throat, mucinex for mucous, nasal spray, Flonase.  No other aggravating or relieving factors.    No other c/o.  The following portions of the patient's history were reviewed and updated as appropriate: allergies, current medications, past family history, past medical history, past social history, past surgical history and problem list.  ROS Otherwise as in subjective above  Objective: BP 120/70   Pulse 69   Temp 98.6 F (37 C)   Wt 141 lb 12.8 oz (64.3 kg)   BMI 28.64 kg/m   General appearance: alert, no distress, well developed, well nourished HEENT: normocephalic, sclerae anicteric, conjunctiva pink and moist, TMs pearly, nares with clear discharge, mild erythema, pharynx normal Oral cavity: MMM, no lesions Neck: supple, no lymphadenopathy, no thyromegaly, no masses Heart: RRR, normal S1, S2, no murmurs Lungs: CTA bilaterally, no wheezes, rhonchi, or rales    Assessment: Encounter Diagnoses  Name Primary?   Respiratory tract infection Yes   Acute cough    Sinus pressure      Plan: Symptoms currently suggest viral URI   Recommendations: 1-continue to hydrate well with water throughout the day.  Can also do soup broth or other clear liquids. 2-continue Mucinex DM for cough and congestion 3-I did send a stronger cough medicine called Tussionex if you want to try this instead for the next few days.  Caution this can cause sedation 4-you can use salt water gargles  and warm fluids for any throat pain, Tylenol or short-term over-the-counter pain ibuprofen for fever or not feeling well 5 - In the event you continue to feel the same through the weekend and not improving particularly with worse sinus pressure or thick nasal mucus you can begin amoxicillin.  I sent this to the pharmacy but I put on the label, hold for patient request 6-use your inhaler as needed 2 puffs at least twice daily if feeling short of breath or wheezing If you are much worse over the weekend or new or different symptoms such as shortness of breath or uncontrolled vomiting or other then call back or get reevaluated  Jamie Singh was seen today for sore throat.  Diagnoses and all orders for this visit:  Respiratory tract infection  Acute cough  Sinus pressure  Other orders -     chlorpheniramine-HYDROcodone (TUSSIONEX) 10-8 MG/5ML; Take 5 mLs by mouth 2 (two) times daily. -     amoxicillin (AMOXIL) 875 MG tablet; Take 1 tablet (875 mg total) by mouth 2 (two) times daily for 10 days.    Follow up: prn

## 2022-02-11 ENCOUNTER — Telehealth: Payer: Self-pay | Admitting: Medical

## 2022-02-11 NOTE — Telephone Encounter (Signed)
Left message for patient to call back and schedule Medicare Annual Wellness Visit (AWV) either virtually or in office. Left  my Jamie Singh number [phone]   Last AWV 03/06/21  please schedule with Nurse Health Adviser   45 min for awv-i and in office appointments 30 min for awv-s  phone/virtual appointments

## 2022-03-09 ENCOUNTER — Encounter: Payer: Self-pay | Admitting: Internal Medicine

## 2022-03-10 ENCOUNTER — Telehealth: Payer: Self-pay | Admitting: Medical

## 2022-03-10 ENCOUNTER — Other Ambulatory Visit: Payer: Self-pay | Admitting: Medical

## 2022-03-10 MED ORDER — AMPHETAMINE-DEXTROAMPHETAMINE 10 MG PO TABS: 10.0000 mg | ORAL_TABLET | Freq: Every day | ORAL | 0 refills | Status: DC

## 2022-03-10 NOTE — Telephone Encounter (Signed)
From: Zenaida Deed To: Office of Dorothea Ogle, [place] Sent: 03/10/2022 6:52 AM EST Subject: Medication Renewal Request  Refills have been requested for the following medications:   amphetamine-dextroamphetamine (ADDERALL) 10 MG tablet [Shane Korrin [place]]  Preferred pharmacy: [facility] # [place], [place] Delivery method: [employer]

## 2022-03-10 NOTE — Telephone Encounter (Signed)
Left message for patient to call back and schedule Medicare Annual Wellness Visit (AWV) either virtually or in office. Left  my Jamie Singh number [phone]   Last AWV 03/06/21 please schedule with Nurse Health Adviser   45 min for awv-i  in office appointments 30 min for awv-s & awv-i phone/virtual appointments

## 2022-03-16 ENCOUNTER — Other Ambulatory Visit: Payer: Self-pay

## 2022-03-16 ENCOUNTER — Telehealth: Payer: Self-pay | Admitting: Medical

## 2022-03-16 MED ORDER — LEVOTHYROXINE SODIUM 100 MCG PO TABS: 100.0000 ug | ORAL_TABLET | Freq: Every day | ORAL | 1 refills | Status: DC

## 2022-03-16 MED ORDER — FAMOTIDINE 20 MG PO TABS: 20.0000 mg | ORAL_TABLET | Freq: Every day | ORAL | 1 refills | Status: DC

## 2022-03-16 MED ORDER — ROSUVASTATIN CALCIUM 10 MG PO TABS: 10.0000 mg | ORAL_TABLET | Freq: Every day | ORAL | 1 refills | Status: DC

## 2022-03-16 NOTE — Telephone Encounter (Signed)
New pharmacy sent refills for pantoprazole sodium Wellbutrin xl Lexapro Pepcid Synthroid Crestor Please send to the new express mail order

## 2022-03-17 ENCOUNTER — Other Ambulatory Visit: Payer: Self-pay | Admitting: Medical

## 2022-03-17 MED ORDER — ESCITALOPRAM OXALATE 10 MG PO TABS: 10.0000 mg | ORAL_TABLET | Freq: Every day | ORAL | 1 refills | Status: DC

## 2022-03-17 MED ORDER — BUPROPION HCL ER (XL) 150 MG PO TB24: 150.0000 mg | ORAL_TABLET | Freq: Every day | ORAL | 1 refills | Status: DC

## 2022-03-23 ENCOUNTER — Ambulatory Visit
Admission: RE | Admit: 2022-03-23 | Discharge: 2022-03-23 | Disposition: A | Discharge status: Home / Self Care | Payer: Medicare Other | Source: Ambulatory Visit | Attending: Medical | Admitting: Medical

## 2022-03-23 VITALS — BP 116/79 | HR 80 | Temp 98.2°F | Resp 16

## 2022-03-23 DIAGNOSIS — H1012 Acute atopic conjunctivitis, left eye: Secondary | ICD-10-CM | POA: Diagnosis not present

## 2022-03-23 NOTE — ED Triage Notes (Signed)
Pt c/o redness, itchiness and crusting to bilateral eyes.   Started: Sunday   Home interventions: OTC eye drops

## 2022-03-23 NOTE — Discharge Instructions (Signed)
Start over-the-counter allergy medicine such as Claritin or Zyrtec daily for at least 7 days Allergy eyedrops over-the-counter such as Clear Eyes Warm compresses to the eye as needed Please follow-up with your PCP if symptoms do not improve Please go to the emergency room if you have any worsening symptoms

## 2022-03-23 NOTE — ED Provider Notes (Signed)
[facility]    CSN: [id number] Arrival date & time: 03/23/22  1419      History   Chief Complaint Chief Complaint  Patient presents with   Eye Problem    My eyes are itchy, red and crusty. I thought it was just my animal allergies but benadryl and allergy drops haven't helped. WebMD says pink eye/conjunctivitis, for what it's worth. - Entered by patient    HPI Jamie Singh is a 72 y.o. female who presents for evaluation of an eye complaint for the past 3 day. Patient reports left eye redness, itching, crusting drainage, and watery discharge and denies purulent discharge, loss or change in vision, photophobia, trauma or pain. Reports no URI sx. does have history of allergies specifically to animals.  She does not wear contacts.  Has prescription sunglasses only.  Patient has taken Benadryl and OTC allergy eyedrops for symptoms. Pt has no other concerns at this time.    Eye Problem Associated symptoms: discharge, itching and redness     Past Medical History:  Diagnosis Date   ADHD    Allergy    Anxiety and depression    Aortic aneurysm (HCC)    Asthma    mild, but prior on preventative FLovent   Endometrial cancer ([place]) 2020   Genital herpes    GERD (gastroesophageal reflux disease)    Hyperlipidemia    Hypothyroidism    Osteoporosis    Thoracic aortic aneurysm ([place])    Wears hearing aid     Patient Active Problem List   Diagnosis Date Noted   Osteoporosis 10/30/2021   Screen for colon cancer 10/30/2021   Screening for diabetes mellitus 10/30/2021   ADHD 10/30/2021   Anxiety and depression 10/30/2021   Sciatica of left side 12/09/2020   Attention deficit hyperactivity disorder (ADHD), combined type 10/06/2020   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   Pure hypercholesterolemia 08/20/2020   Vitamin D deficiency, unspecified  08/20/2020   Urinary frequency 08/20/2020   Aortic valve regurgitation 05/19/2020   Gastroesophageal reflux disease 05/19/2020   Encounter  for health maintenance examination in adult 02/20/2020   Uses hearing aid 02/20/2020   Vaccine counseling 02/20/2020   Post-menopausal 02/20/2020   Estrogen deficiency 02/20/2020   Osteopenia 02/20/2020   Snoring 02/20/2020   History of eating disorder 02/20/2020   History of endometrial cancer 10/18/2019   Mild intermittent asthma without complication 33/00/7622   Thoracic aortic aneurysm without rupture ([place]) 10/18/2019   S/P hysterectomy 10/18/2019   Excessive sweating 10/18/2019   Hypothyroidism 10/18/2019    Past Surgical History:  Procedure Laterality Date   COLONOSCOPY  2019   ESOPHAGOGASTRODUODENOSCOPY ENDOSCOPY     ROBOTIC ASSISTED TOTAL HYSTERECTOMY WITH BILATERAL SALPINGO OOPHERECTOMY     RA-TLH/BSO   TONSILLECTOMY AND ADENOIDECTOMY     WRIST FRACTURE SURGERY Right     OB History     Gravida  2   Para  0   Term      Preterm      AB      Living         SAB      IAB      Ectopic      Multiple      Live Births               Home Medications    Prior to Admission medications   Medication Sig Start Date End Date Taking? Authorizing Provider  albuterol (VENTOLIN HFA) 108 (90 Base) MCG/ACT inhaler Inhale  2 puffs into the lungs every 6 (six) hours as needed for wheezing or shortness of breath. 01/13/22   Tysinger, Camelia Eng, PA-C  amphetamine-dextroamphetamine (ADDERALL) 10 MG tablet Take 1 tablet (10 mg total) by mouth daily with breakfast. 03/10/22   Tysinger, Camelia Eng, PA-C  buPROPion (WELLBUTRIN XL) 150 MG 24 hr tablet Take 1 tablet (150 mg total) by mouth daily. 03/17/22   Tysinger, Camelia Eng, PA-C  Calcium-Magnesium 250-125 MG TABS Take 2 tablets by mouth daily.    [provider]  chlorpheniramine-HYDROcodone (TUSSIONEX) 10-8 MG/5ML Take 5 mLs by mouth 2 (two) times daily. 01/29/22   Tysinger, Camelia Eng, PA-C  cholecalciferol (VITAMIN D) 25 MCG (1000 UNIT) tablet TAKE 1 TABLET EVERY DAY 09/03/21   Tysinger, Camelia Eng, PA-C  escitalopram  (LEXAPRO) 10 MG tablet Take 1 tablet (10 mg total) by mouth daily. 03/17/22   Tysinger, Camelia Eng, PA-C  famotidine (PEPCID) 20 MG tablet Take 1 tablet (20 mg total) by mouth at bedtime. 03/16/22   Tysinger, Camelia Eng, PA-C  levothyroxine (SYNTHROID) 100 MCG tablet Take 1 tablet (100 mcg total) by mouth daily. 03/16/22   Tysinger, Camelia Eng, PA-C  pantoprazole (PROTONIX) 40 MG tablet TAKE 1 TABLET EVERY DAY 01/25/22   Tysinger, Camelia Eng, PA-C  PSYLLIUM HUSK PO Take 2,000 mg by mouth daily.    [provider]  rosuvastatin (CRESTOR) 10 MG tablet Take 1 tablet (10 mg total) by mouth daily. 03/16/22   Tysinger, Camelia Eng, PA-C  valACYclovir (VALTREX) 500 MG tablet Take 500 mg by mouth as needed.    [provider]    Family History Family History  Problem Relation Age of Onset   COPD Mother    Heart failure Mother    Macular degeneration Mother    Osteoporosis Mother    Cancer Mother        Mom needed a hysterectomy, thinks that she may have had uterine cancer   Heart disease Father        MI   COPD Father    Aortic aneurysm Father    Heart disease Brother        MI   Myelodysplastic syndrome Brother    Osteoporosis Maternal Grandmother    Heart failure Maternal Grandmother    Heart failure Maternal Grandfather    COPD Maternal Grandfather    Stroke Paternal Grandfather    Breast cancer Neg Hx    Colon cancer Neg Hx    Colon polyps Neg Hx    Esophageal cancer Neg Hx    Rectal cancer Neg Hx    Stomach cancer Neg Hx     Social History Social History   Tobacco Use   Smoking status: Never   Smokeless tobacco: Never  Vaping Use   Vaping Use: Never used  Substance Use Topics   Alcohol use: Not Currently    Alcohol/week: 2.0 - 3.0 standard drinks of alcohol    Types: 2 - 3 Glasses of wine per week    Comment: 2-4 times monthly   Drug use: Yes    Types: Marijuana    Comment: gummies     Allergies   Pollen extract and Quinolones   Review of Systems Review of  Systems  Eyes:  Positive for discharge, redness and itching.     Physical Exam Triage Vital Signs ED Triage Vitals  Enc Vitals Group     BP 03/23/22 1449 116/79     Pulse Rate 03/23/22 1449 80  Resp 03/23/22 1449 16     Temp 03/23/22 1449 98.2 F (36.8 C)     Temp Source 03/23/22 1449 Oral     SpO2 03/23/22 1449 95 %     Weight --      Height --      Head Circumference --      Peak Flow --      Pain Score 03/23/22 1450 3     Pain Loc --      Pain Edu? --      Excl. in [place]? --    No data found.  Updated Vital Signs BP 116/79 (BP Location: Left Arm)   Pulse 80   Temp 98.2 F (36.8 C) (Oral)   Resp 16   SpO2 95%   Visual Acuity Right Eye Distance:   Left Eye Distance:   Bilateral Distance:    Right Eye Near:   Left Eye Near:    Bilateral Near:     Physical Exam Vitals and nursing note reviewed.  Constitutional:      Appearance: Normal appearance.  HENT:     Head: Normocephalic and atraumatic.  Eyes:     General: Lids are normal.        Left eye: Discharge present.No foreign body or hordeolum.     Extraocular Movements: Extraocular movements intact.     Conjunctiva/sclera:     Left eye: Left conjunctiva is injected. No chemosis, exudate or hemorrhage.    Pupils: Pupils are equal, round, and reactive to light.     Comments: Watery drainage noted  Cardiovascular:     Rate and Rhythm: Normal rate.  Pulmonary:     Effort: Pulmonary effort is normal.  Skin:    General: Skin is warm.  Neurological:     General: No focal deficit present.     Mental Status: She is alert and oriented to person, place, and time.  Psychiatric:        Mood and Affect: Mood normal.        Behavior: Behavior normal.      UC Treatments / Results  Labs (all labs ordered are listed, but only abnormal results are displayed) Labs Reviewed - No data to display  EKG   Radiology No results found.  Procedures Procedures (including critical care time)  Medications Ordered  in UC Medications - No data to display  Initial Impression / Assessment and Plan / UC Course  I have reviewed the triage vital signs and the nursing notes.  Pertinent labs & imaging results that were available during my care of the patient were reviewed by me and considered in my medical decision making (see chart for details).     Discussed with patient exam and symptoms consistent with allergic conjunctivitis Advised OTC long-acting antihistamine such as Claritin or Zyrtec OTC antihistamine eyedrop such as Clear Eyes Warm compresses to the eye as needed Follow-up with PCP 2 to 3 days for recheck ER precautions reviewed and patient verbalized understanding Final Clinical Impressions(s) / UC Diagnoses   Final diagnoses:  Allergic conjunctivitis of left eye     Discharge Instructions      Start over-the-counter allergy medicine such as Claritin or Zyrtec daily for at least 7 days Allergy eyedrops over-the-counter such as Clear Eyes Warm compresses to the eye as needed Please follow-up with your PCP if symptoms do not improve Please go to the emergency room if you have any worsening symptoms   ED Prescriptions   None  PDMP not reviewed this encounter.   Melynda Ripple, NP 03/23/22 [phone]

## 2022-03-29 ENCOUNTER — Telehealth: Payer: Self-pay | Admitting: Medical

## 2022-03-29 MED ORDER — PANTOPRAZOLE SODIUM 40 MG PO TBEC: 40.0000 mg | DELAYED_RELEASE_TABLET | Freq: Every day | ORAL | 1 refills | Status: DC

## 2022-03-29 NOTE — Telephone Encounter (Signed)
done

## 2022-03-29 NOTE — Telephone Encounter (Signed)
[facility] sent a refill request for protonix Please send to the [facility] mail delivery

## 2022-04-14 ENCOUNTER — Encounter: Payer: Self-pay | Admitting: Gynecologic Oncology

## 2022-04-16 ENCOUNTER — Telehealth: Payer: Self-pay

## 2022-04-16 NOTE — Telephone Encounter (Signed)
ENCOUNTER OPENED IN ERROR

## 2022-04-16 NOTE — Telephone Encounter (Signed)
LM for Jamie Singh to call back to the office to schedule an appointment to see [name], NP on Friday 04-23-22 at 0830 with an arrival at 0800 to register.

## 2022-04-23 ENCOUNTER — Inpatient Hospital Stay: Payer: Medicare Other | Attending: Gynecologic Oncology | Admitting: Gynecologic Oncology

## 2022-04-23 VITALS — BP 127/83 | HR 70 | Temp 98.3°F | Resp 14 | Wt 142.4 lb

## 2022-04-23 DIAGNOSIS — N050 Unspecified nephritic syndrome with minor glomerular abnormality: Secondary | ICD-10-CM

## 2022-04-23 DIAGNOSIS — Z8542 Personal history of malignant neoplasm of other parts of uterus: Secondary | ICD-10-CM

## 2022-04-23 DIAGNOSIS — C541 Malignant neoplasm of endometrium: Secondary | ICD-10-CM

## 2022-04-23 DIAGNOSIS — Z923 Personal history of irradiation: Secondary | ICD-10-CM | POA: Insufficient documentation

## 2022-04-23 DIAGNOSIS — Z90722 Acquired absence of ovaries, bilateral: Secondary | ICD-10-CM | POA: Diagnosis not present

## 2022-04-23 DIAGNOSIS — N950 Postmenopausal bleeding: Secondary | ICD-10-CM | POA: Diagnosis not present

## 2022-04-23 DIAGNOSIS — N9089 Other specified noninflammatory disorders of vulva and perineum: Secondary | ICD-10-CM | POA: Diagnosis not present

## 2022-04-23 DIAGNOSIS — Z90710 Acquired absence of both cervix and uterus: Secondary | ICD-10-CM | POA: Diagnosis not present

## 2022-04-23 DIAGNOSIS — N898 Other specified noninflammatory disorders of vagina: Secondary | ICD-10-CM

## 2022-04-23 DIAGNOSIS — R21 Rash and other nonspecific skin eruption: Secondary | ICD-10-CM

## 2022-04-23 NOTE — Progress Notes (Signed)
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  On 08/18/2018 she underwent robotic assisted total laparoscopic hysterectomy, bilateral salpingo-oophorectomy, bilateral sentinel lymph node dissection, and collection of pelvic washings.  Pathology revealed a tumor size of 4.5 cm, myometrial invasion 42% (8 of 19 mm), no LVSI.  Was performed at [facility] with Dr. Fleet Contras.  Patient then received adjuvant vaginal brachytherapy (vaginal cuff HDR, 4 fractions of 5.5 Gray, treated from 8/11-8/21/2020).   The patient was seen for surveillance in [place] with Dr. Jake Bathe, last on 11/2019.  At that time she underwent Pap test that was negative. Her last surveillance exam with our office, Dr. Berline Lopes, was in April 2023 wit  Interval History: Patient presents to the office today for evaluation of vulvar/vaginal itching, rash and vaginal spotting.  She states she has been doing well since her last visit with [place].  She continues to use the dilator around once a week.  She uses plain [place] for lubrication when using this.  She remains very active and swims at the [facility] pool 5 days a week.  She wonders if the pool water/chlorine may be contributing to the rash.  She has had itching on the vulva that extends into the vagina along with the rash-like area.  She states her bowels and bladder habits have not changed since last visit.  She does have occasional leakage of urine and states she may not change right away depending where she is at.  She denies chest pain, cough.  She reports mild intermittent shortness of breath related to her asthma.  No abdominal pain or bloating reported.  Denies unintentional weight loss or gain.  No rectal bleeding or blood in the stool reported.  No lower extremity edema or pain  reported.  She originally noted vaginal spotting when using the dilator.  This has now continued on days when she is not using the dilator.  Denies any other abnormal drainage/discharge from the vagina. No other concerns or symptoms reported.  Past Medical/Surgical History: Past Medical History:  Diagnosis Date   ADHD    Allergy    Anxiety and depression    Aortic aneurysm (HCC)    Asthma    mild, but prior on preventative FLovent   Endometrial cancer ([place]) 2020   Genital herpes    GERD (gastroesophageal reflux disease)    Hyperlipidemia    Hypothyroidism    Osteoporosis    Thoracic aortic aneurysm ([place])    Wears hearing aid     Past Surgical History:  Procedure Laterality Date   COLONOSCOPY  2019   ESOPHAGOGASTRODUODENOSCOPY ENDOSCOPY     ROBOTIC ASSISTED TOTAL HYSTERECTOMY WITH BILATERAL SALPINGO OOPHERECTOMY     RA-TLH/BSO   TONSILLECTOMY AND ADENOIDECTOMY     WRIST FRACTURE SURGERY Right     Family History  Problem Relation Age of Onset   COPD Mother    Heart failure Mother    Macular degeneration Mother    Osteoporosis Mother    Cancer Mother        Mom needed a hysterectomy, thinks that she may have had uterine cancer   Heart disease Father        MI   COPD Father    Aortic aneurysm Father    Heart disease Brother        MI  Myelodysplastic syndrome Brother    Osteoporosis Maternal Grandmother    Heart failure Maternal Grandmother    Heart failure Maternal Grandfather    COPD Maternal Grandfather    Stroke Paternal Grandfather    Breast cancer Neg Hx    Colon cancer Neg Hx    Colon polyps Neg Hx    Esophageal cancer Neg Hx    Rectal cancer Neg Hx    Stomach cancer Neg Hx     Social History   Socioeconomic History   Marital status: Married    Spouse name: Not on file   Number of children: Not on file   Years of education: Not on file   Highest education level: Bachelor's degree (e.g., BA, AB, BS)  Occupational History   Occupation:  Environmental education officer - retired  Tobacco Use   Smoking status: Never   Smokeless tobacco: Never  Vaping Use   Vaping Use: Never used  Substance and Sexual Activity   Alcohol use: Not Currently    Alcohol/week: 2.0 - 3.0 standard drinks of alcohol    Types: 2 - 3 Glasses of wine per week    Comment: 2-4 times monthly   Drug use: Yes    Types: Marijuana    Comment: gummies   Sexual activity: Not Currently  Other Topics Concern   Not on file  Social History Narrative   Lives with husband and dog.  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   Social Determinants of Health   Financial Resource Strain: Low Risk  (06/01/2021)   Overall Financial Resource Strain (CARDIA)    Difficulty of Paying Living Expenses: Not hard at all  Food Insecurity: No Food Insecurity (06/01/2021)   Hunger Vital Sign    Worried About Running Out of Food in the Last Year: Never true    Ran Out of Food in the Last Year: Never true  Transportation Needs: No Transportation Needs (06/01/2021)   PRAPARE - Hydrologist (Medical): No    Lack of Transportation (Non-Medical): No  Physical Activity: Sufficiently Active (06/01/2021)   Exercise Vital Sign    Days of Exercise per Week: 4 days    Minutes of Exercise per Session: 60 min  Stress: Stress Concern Present (06/01/2021)   [place]    Feeling of Stress : To some extent  Social Connections: Moderately Isolated (06/01/2021)   Social Connection and Isolation Panel [NHANES]    Frequency of Communication with Friends and Family: More than three times a week    Frequency of Social Gatherings with Friends and Family: Once a week    Attends Religious Services: Never    Marine scientist or Organizations: No    Attends Music therapist: Not on file    Marital Status: Married    Current Medications:  Current Outpatient Medications:    albuterol (VENTOLIN HFA) 108 (90 Base)  MCG/ACT inhaler, Inhale 2 puffs into the lungs every 6 (six) hours as needed for wheezing or shortness of breath., Disp: 3 each, Rfl: 0   amphetamine-dextroamphetamine (ADDERALL) 10 MG tablet, Take 1 tablet (10 mg total) by mouth daily with breakfast., Disp: 90 tablet, Rfl: 0   buPROPion (WELLBUTRIN XL) 150 MG 24 hr tablet, Take 1 tablet (150 mg total) by mouth daily., Disp: 90 tablet, Rfl: 1   Calcium-Magnesium 250-125 MG TABS, Take 2 tablets by mouth daily., Disp: , Rfl:    cholecalciferol (VITAMIN D) 25 MCG (1000  UNIT) tablet, TAKE 1 TABLET EVERY DAY, Disp: 90 tablet, Rfl: 0   escitalopram (LEXAPRO) 10 MG tablet, Take 1 tablet (10 mg total) by mouth daily., Disp: 90 tablet, Rfl: 1   estradiol (ESTRACE VAGINAL) 0.1 MG/GM vaginal cream, Use small fingertip size amount of cream at the entrance of the vagina at bedtime three times a week for the next two weeks, Disp: 42.5 g, Rfl: 0   famotidine (PEPCID) 20 MG tablet, Take 1 tablet (20 mg total) by mouth at bedtime., Disp: 90 tablet, Rfl: 1   levothyroxine (SYNTHROID) 100 MCG tablet, Take 1 tablet (100 mcg total) by mouth daily., Disp: 90 tablet, Rfl: 1   pantoprazole (PROTONIX) 40 MG tablet, Take 1 tablet (40 mg total) by mouth daily., Disp: 90 tablet, Rfl: 1   PSYLLIUM HUSK PO, Take 2,000 mg by mouth daily., Disp: , Rfl:    rosuvastatin (CRESTOR) 10 MG tablet, Take 1 tablet (10 mg total) by mouth daily., Disp: 90 tablet, Rfl: 1   valACYclovir (VALTREX) 500 MG tablet, Take 500 mg by mouth as needed., Disp: , Rfl:   Review of Systems: See interval. Additional ROS negative.  Physical Exam: BP 127/83 (BP Location: Left Arm, Patient Position: Sitting)   Pulse 70   Temp 98.3 F (36.8 C) (Tympanic)   Resp 14   Wt 142 lb 6.4 oz (64.6 kg)   SpO2 99%   BMI 28.76 kg/m  General: Alert, oriented, no acute distress. HEENT: Normocephalic, atraumatic, sclera anicteric. Chest: Clear to auscultation bilaterally.  No wheezes or rhonchi. Cardiovascular:  Regular rate and rhythm, no murmurs. Abdomen: soft, nontender.  Normoactive bowel sounds.  No masses or hepatosplenomegaly appreciated.  Well-healed incisions. Extremities: Grossly normal range of motion.  Warm, well perfused.  No edema bilaterally. Skin: No rashes or lesions noted. Lymphatics: No cervical, supraclavicular, or inguinal adenopathy. GU: Normal appearing external genitalia except for mild erythema and whitened tissue at the introitus. With patient consent, punch biopsy taken from the left aspect of the vulva at the introitus. Pediatric speculum exam reveals moderately atrophic shortened vagina and could only be opened minimally.  Radiation changes noted. No masses, bleeding, or discharge noted in the vagina.   Vulvar/vaginal biopsy procedure Preoperative diagnosis: Vulvar symptoms including itching, irritation, and rash Postoperative diagnosis: Same as above Provider: [name] NP Estimated blood loss: Minimal Specimens: Left introitus biopsy Procedure: After the procedure was discussed with the patient including risks and benefits, she gave verbal consent.  She was then placed in dorsolithotomy position the vulvar area to be biopsy was examined with findings as above.  The area was cleansed with Betadine x 3 with plan for biopsy of the whitened, raised area at the left of the introitus. 2 cc of 1% lidocaine was then injected.  After adequate time for anesthesia was given, a 3 mm punch biopsy was taken.  This was placed in formalin.  Silver nitrate and pressure were used to achieve hemostasis.  Overall the patient tolerated the procedure well.  Laboratory & Radiologic Studies: None new  Assessment & Plan: Jamie Singh is a 72 y.o. woman with  Stage IA grade 3 endometrioid endometrial adenocarcinoma who completed adjuvant vaginal brachytherapy in the setting of high intermediate risk features in August 2020.   Patient presents today for a symptom management visit due to  vulvar itching, rash, and irritation. Given her history and lesion noted at the introitus, biopsy taken. No findings concerning for recurrence.    She will be contacted with  the results of her vulvar/vaginal biopsy from today. Vulvar care and post biopsy care discussed.    Per NCCN surveillance recommendations, we will continue to recommend visits every 6 months.    28 minutes of total time was spent for this patient encounter, including preparation, face-to-face counseling with the patient and coordination of care, and documentation of the encounter.  [name], NP Gynecologic Oncology

## 2022-04-23 NOTE — Patient Instructions (Addendum)
Today we took a biopsy from the entrance of the vagina. We will contact you with the results. You may have some light spotting and see a grayish discharge from the medication used to stop the bleeding.  Plan to hold off on dilator use until end of next week.  Also avoid the pool today and over the weekend while the biopsied area heals.  Please call for any needs or concerns or new symptoms.  If you have leakage of urine, rinse this off as soon as possible or change your pad.  We will plan on seeing you in the office in August 2024 or sooner if needed. Please call the office at [phone] closer to August to schedule this appt.

## 2022-04-26 ENCOUNTER — Other Ambulatory Visit: Payer: Self-pay | Admitting: Gynecologic Oncology

## 2022-04-26 ENCOUNTER — Encounter: Payer: Self-pay | Admitting: Surgery

## 2022-04-26 ENCOUNTER — Telehealth: Payer: Self-pay | Admitting: Surgery

## 2022-04-26 DIAGNOSIS — N894 Leukoplakia of vagina: Secondary | ICD-10-CM

## 2022-04-26 LAB — SURGICAL PATHOLOGY

## 2022-04-26 MED ORDER — ESTRADIOL 0.1 MG/GM VA CREA: TOPICAL_CREAM | VAGINAL | 0 refills | Status: AC

## 2022-04-26 NOTE — Telephone Encounter (Signed)
Called patient to give her her biopsy results. Per [name], APP informed patient that her biopsy results show there is no precancer or cancer. The biopsy is showing hyperkeratosis which means thickening of the skin's outer layer, which can happen from friction or local irritation. This is most likely in response to the dilator use.   Patient states itching is much improved, almost gone. Advised patient that we can try a topical estrogen cream to apply right at the area for a short period of time to help with healing if she would like. Patient verbalized she would like to try the cream and requested [employer]. Patient also advised that our office will check with Dr Berline Lopes to see if she needs to continue using the dilator and our office will let her know Dr Charisse March recommendations. Patient voiced no other concerns at this time.

## 2022-04-29 ENCOUNTER — Telehealth: Payer: Self-pay | Admitting: Surgery

## 2022-04-29 NOTE — Telephone Encounter (Signed)
Called patient to see how she's doing. Patient denies any S&S of infection at this time. Advised patient that if she wants to try using a bidet that may be a good option. Advised patient to call if she has any concerns or other questions. Patient verbalized understanding and had no concerns at this time.

## 2022-04-30 ENCOUNTER — Encounter: Payer: Self-pay | Admitting: Gynecologic Oncology

## 2022-05-01 IMAGING — MR MR LUMBAR SPINE W/O CM
4 of 5 series · 18 of 48 positions shown · non-contrast
Comparison: None.

CLINICAL DATA: Left leg pain for 6 weeks. Pain with walking.

EXAM:
MRI LUMBAR SPINE WITHOUT CONTRAST
TECHNIQUE: Multiplanar, multisequence MR imaging of the lumbar spine was
performed. No intravenous contrast was administered.

[Series 6: T2 · sagittal · 4.0mm · 0.73mm/px · 6 of 15 slices shown (1 of 2)]
[im 1/15]
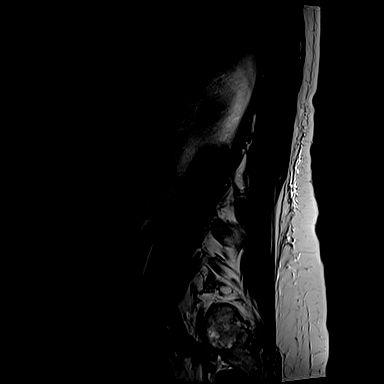
[im 3/15]
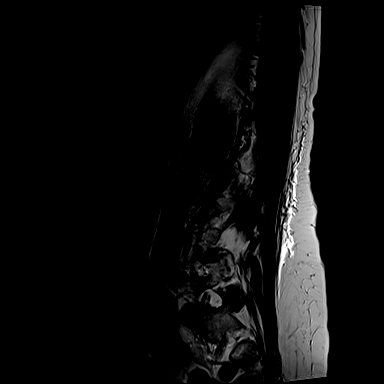
[im 6/15]
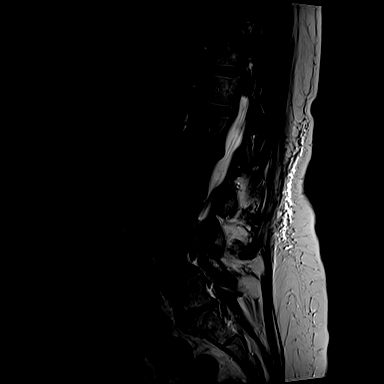
[im 9/15]
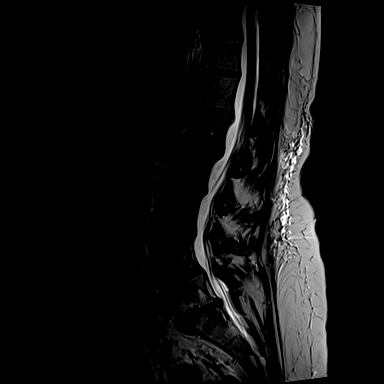
[im 12/15]
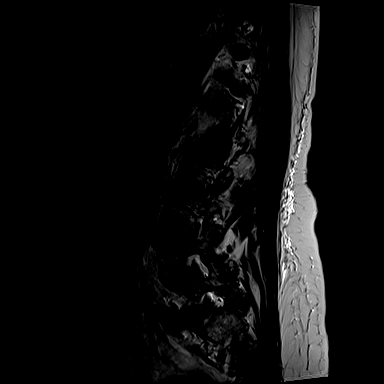
[im 15/15]
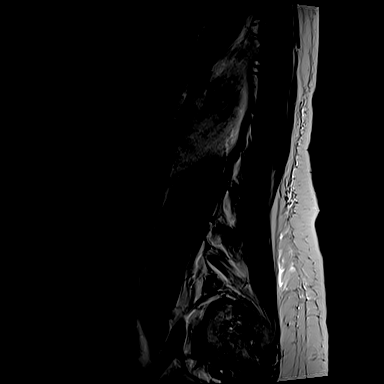

[Series 7: T1 · sagittal · 4.0mm · 0.73mm/px · 3 of 15 slices shown (1 of 2)]
[im 3/15]
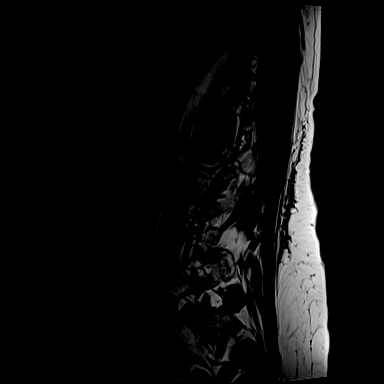
[im 9/15]
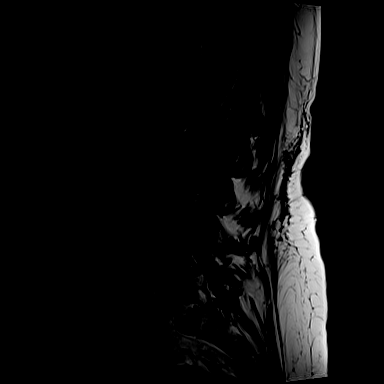
[im 15/15]
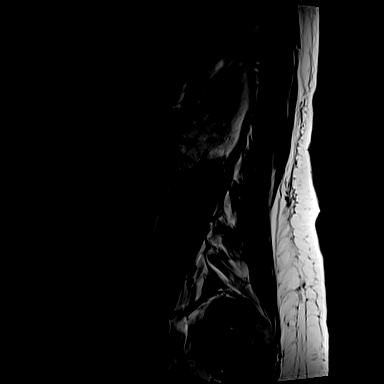

[Series 13: T2 · axial · 4.0mm · 0.28mm/px · z∈[-50,+125]mm · 6 of 39 slices shown (2 of 2)]
[im 1/39]
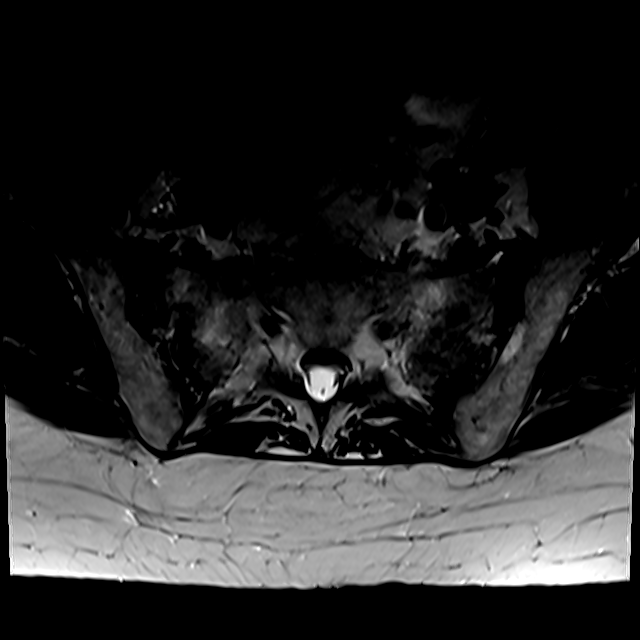
[im 6/39]
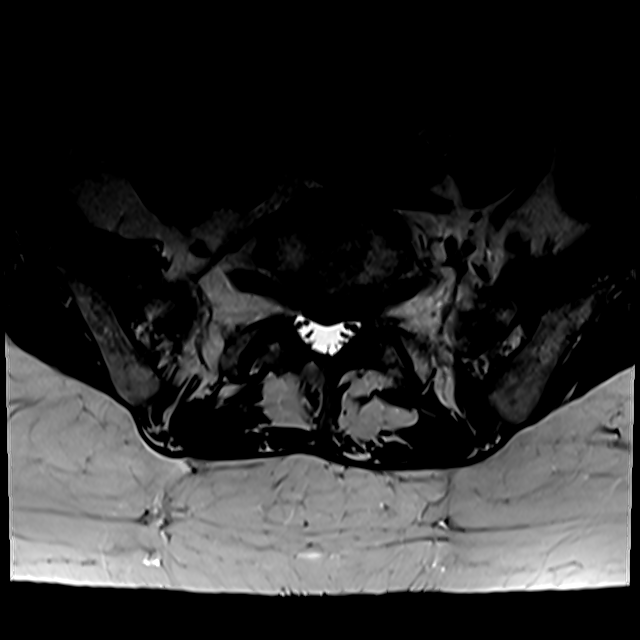
[im 11/39]
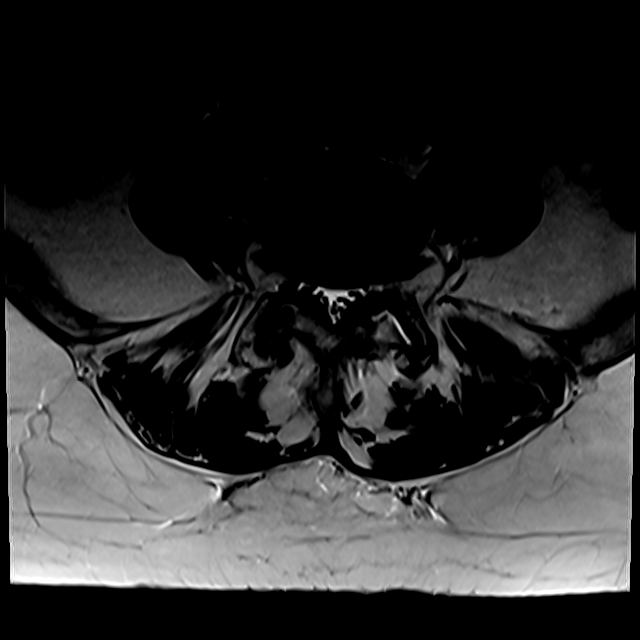
[im 17/39]
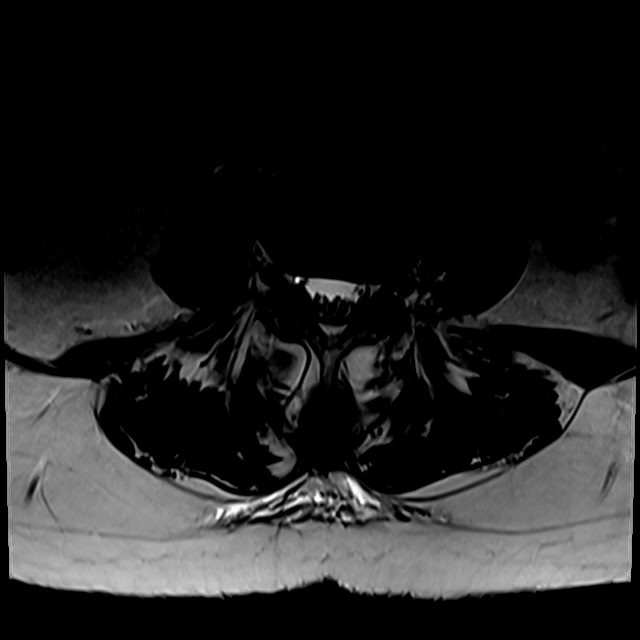
[im 20/39]
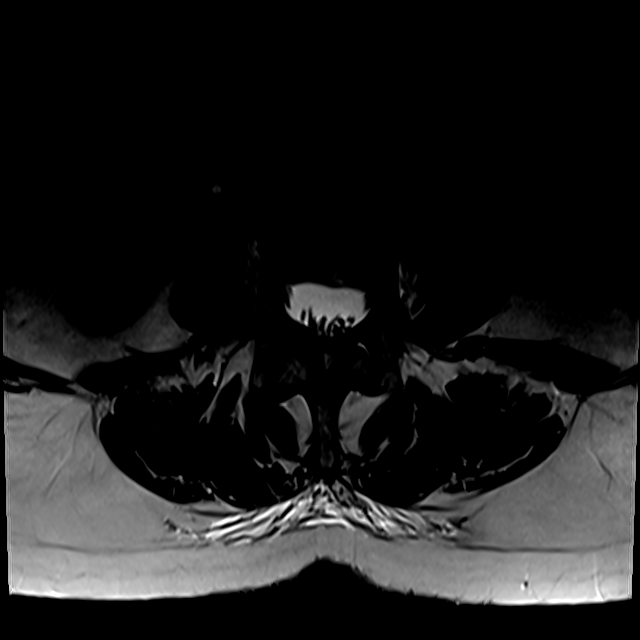
[im 33/39]
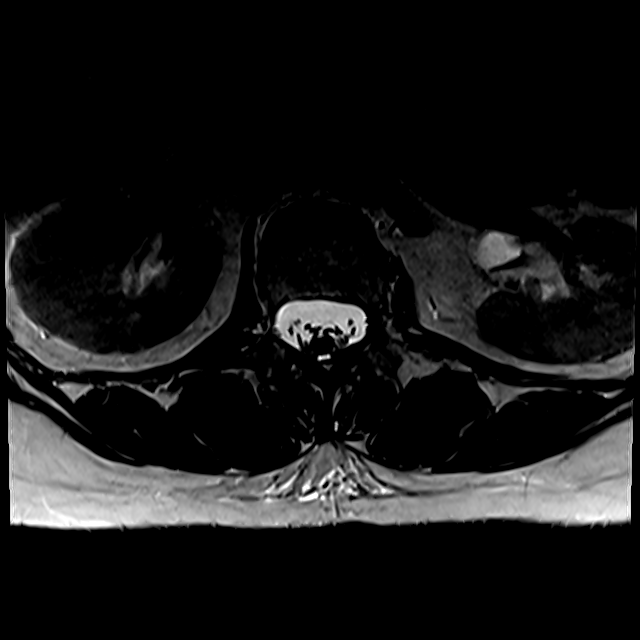

[Series 100: T1 · axial · 4.0mm · 0.28mm/px · z∈[-26,+125]mm · 3 of 39 slices shown (2 of 2)]
[im 6/39]
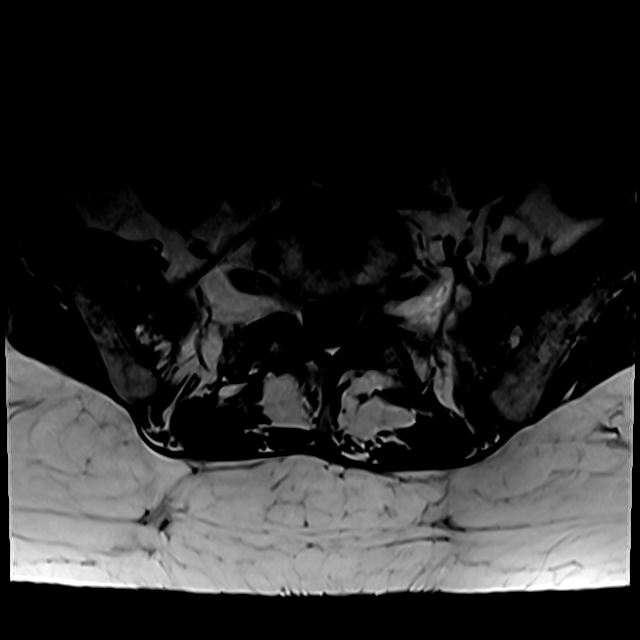
[im 20/39]
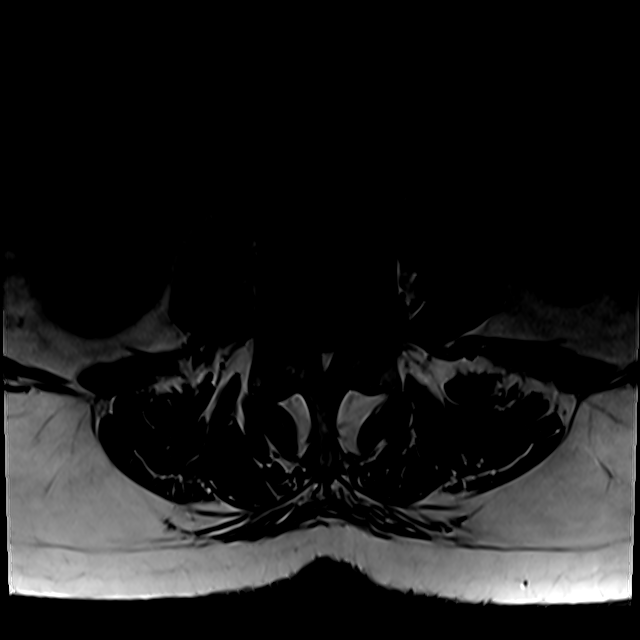
[im 33/39]
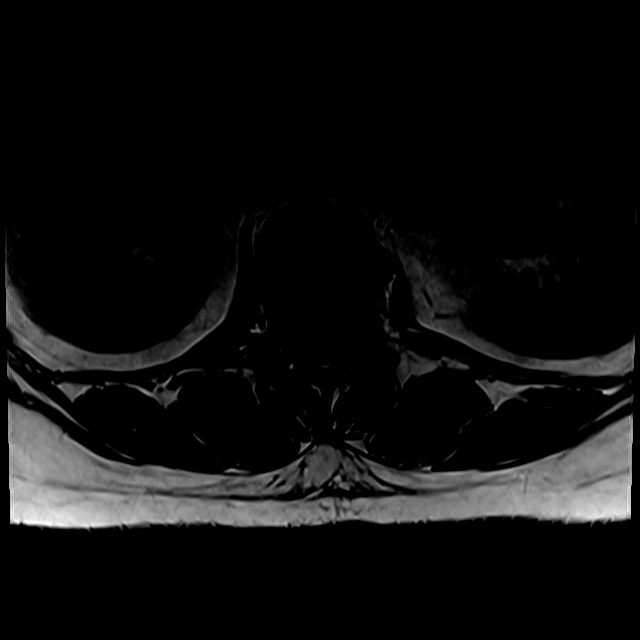

[18 of 48 positions shown; findings below may reference images not displayed]

FINDINGS: Segmentation: 5 non rib-bearing lumbar type vertebral bodies are
present. The lowest fully formed vertebral body is L5.

Alignment: Slight retrolisthesis present at L1-2, L2-3, and to a
lesser extent at L3-4. Grade 1 anterolisthesis at L4-5 measures 5
mm. Rightward curvature of the thoracolumbar spine is centered at
L1. History leftward curvature is present at L4.

Vertebrae: Chronic fatty endplate changes are present at L5-S1.
Extensive edema is present the sacral ala bilaterally, compatible
with bilateral sacral insufficiency fractures.

Conus medullaris and cauda equina: Conus extends to the L1-2 level.
Conus and cauda equina appear normal.

Paraspinal and other soft tissues: Limited imaging the abdomen is
unremarkable. There is no significant adenopathy. No solid organ
lesions are present.

Disc levels:

T12-L1: Mild disc bulging is present without significant stenosis.

L1-2: Uncovering of a broad-based disc protrusion is present. There
is some distortion of the central canal without focal stenosis.
Moderate facet hypertrophy is noted bilaterally. Mild foraminal
narrowing is worse left than right.

L2-3: A broad-based disc protrusion and bilateral facet hypertrophy
is present. Mild right subarticular narrowing is present. Moderate
foraminal narrowing is worse right than left.

L3-4: Broad-based disc bulge is present. Mild foraminal narrowing is
noted bilaterally.

L4-5: Uncovering of a broad-based disc protrusion is noted. Moderate
to severe facet hypertrophy is noted bilaterally. Mild central canal
narrowing is present. Mild foraminal narrowing is worse right than
left.

L5-S1: A central disc protrusion is present. Potential contact of
the traversing S1 nerve roots is noted bilaterally. The foramina are
patent.
IMPRESSION: 1. Extensive edema within the sacral ala bilaterally, compatible
with bilateral sacral insufficiency fractures.
2. Multilevel spondylosis of the lumbar spine as described.
Scoliosis as described.
3. Mild foraminal narrowing bilaterally at L1-2 is worse left than
right.
4. Mild right subarticular and moderate foraminal stenosis at L2-3
is worse right than left.
5. Mild central canal narrowing at L4-5 with mild foraminal
narrowing bilaterally, worse right than left.
6. Central disc protrusion at L5-S[DATE] contact the traversing S1
nerve roots bilaterally.

## 2022-05-05 ENCOUNTER — Encounter: Payer: Medicare Other | Admitting: Medical

## 2022-05-14 ENCOUNTER — Ambulatory Visit (INDEPENDENT_AMBULATORY_CARE_PROVIDER_SITE_OTHER): Payer: Medicare Other | Admitting: Medical

## 2022-05-14 VITALS — BP 110/70 | HR 93 | Wt 141.8 lb

## 2022-05-14 DIAGNOSIS — R8290 Unspecified abnormal findings in urine: Secondary | ICD-10-CM

## 2022-05-14 DIAGNOSIS — E039 Hypothyroidism, unspecified: Secondary | ICD-10-CM | POA: Diagnosis not present

## 2022-05-14 DIAGNOSIS — F32A Depression, unspecified: Secondary | ICD-10-CM

## 2022-05-14 DIAGNOSIS — F419 Anxiety disorder, unspecified: Secondary | ICD-10-CM

## 2022-05-14 DIAGNOSIS — F902 Attention-deficit hyperactivity disorder, combined type: Secondary | ICD-10-CM | POA: Diagnosis not present

## 2022-05-14 DIAGNOSIS — E559 Vitamin D deficiency, unspecified: Secondary | ICD-10-CM

## 2022-05-14 DIAGNOSIS — B009 Herpesviral infection, unspecified: Secondary | ICD-10-CM

## 2022-05-14 DIAGNOSIS — E7800 Pure hypercholesterolemia, unspecified: Secondary | ICD-10-CM

## 2022-05-14 LAB — POCT URINALYSIS DIP (PROADVANTAGE DEVICE)
Bilirubin, UA: NEGATIVE
Glucose, UA: NEGATIVE mg/dL
Ketones, POC UA: NEGATIVE mg/dL
Nitrite, UA: NEGATIVE
Protein Ur, POC: NEGATIVE mg/dL
Specific Gravity, Urine: 1.01
Urobilinogen, Ur: NEGATIVE
pH, UA: 7 (ref 5.0–8.0)

## 2022-05-14 MED ORDER — BUPROPION HCL 75 MG PO TABS: 75.0000 mg | ORAL_TABLET | Freq: Every day | ORAL | 0 refills | Status: DC

## 2022-05-14 MED ORDER — VALACYCLOVIR HCL 500 MG PO TABS: 500.0000 mg | ORAL_TABLET | Freq: Every day | ORAL | 0 refills | Status: DC

## 2022-05-14 NOTE — Progress Notes (Signed)
Subjective:  Jamie Singh is a 72 y.o. female who presents for Chief Complaint  Patient presents with   med check    Med check, when waking up in the mornings- she is full phelgm and then it will go ahead and coming back next day, 2 weeks ago had headache, felt dizzy, nausated for 4 days. Not sure if she had Norovirus     Here for med check.  In general she would like to come off the medication.  GERD-on PPI and Pepcid.  Anxiety depression-she is on Wellbutrin and Lexapro but would like to come off Wellbutrin.  She tried to come off Lexapro few years ago but that did not end well.  Hyperlipidemia-compliant with statin without complaint  Needs refill on Valtrex.  Uses occasionally but almost out of pills altogether  Has problems with earwax buildup.  Think she has a lot of wax in her currently.  Uses hearing aids  No other aggravating or relieving factors.    No other c/o.  Past Medical History:  Diagnosis Date   ADHD    Allergy    Anxiety and depression    Aortic aneurysm (HCC)    Asthma    mild, but prior on preventative FLovent   Endometrial cancer ([facility]) 2020   Genital herpes    GERD (gastroesophageal reflux disease)    Hyperlipidemia    Hypothyroidism    Osteoporosis    Thoracic aortic aneurysm ([facility])    Wears hearing aid    Current Outpatient Medications on File Prior to Visit  Medication Sig Dispense Refill   albuterol (VENTOLIN HFA) 108 (90 Base) MCG/ACT inhaler Inhale 2 puffs into the lungs every 6 (six) hours as needed for wheezing or shortness of breath. 3 each 0   amphetamine-dextroamphetamine (ADDERALL) 10 MG tablet Take 1 tablet (10 mg total) by mouth daily with breakfast. 90 tablet 0   Calcium-Magnesium 250-125 MG TABS Take 2 tablets by mouth daily.     cholecalciferol (VITAMIN D) 25 MCG (1000 UNIT) tablet TAKE 1 TABLET EVERY DAY 90 tablet 0   escitalopram (LEXAPRO) 10 MG tablet Take 1 tablet (10 mg total) by mouth daily. 90 tablet 1   estradiol  (ESTRACE VAGINAL) 0.1 MG/GM vaginal cream Use small fingertip size amount of cream at the entrance of the vagina at bedtime three times a week for the next two weeks 42.5 g 0   famotidine (PEPCID) 20 MG tablet Take 1 tablet (20 mg total) by mouth at bedtime. 90 tablet 1   levothyroxine (SYNTHROID) 100 MCG tablet Take 1 tablet (100 mcg total) by mouth daily. 90 tablet 1   pantoprazole (PROTONIX) 40 MG tablet Take 1 tablet (40 mg total) by mouth daily. 90 tablet 1   PSYLLIUM HUSK PO Take 2,000 mg by mouth daily.     rosuvastatin (CRESTOR) 10 MG tablet Take 1 tablet (10 mg total) by mouth daily. 90 tablet 1   No current facility-administered medications on file prior to visit.     The following portions of the patient's history were reviewed and updated as appropriate: allergies, current medications, past family history, past medical history, past social history, past surgical history and problem list.  ROS Otherwise as in subjective above    Objective: BP 110/70   Pulse 93   Wt 141 lb 12.8 oz (64.3 kg)   BMI 28.64 kg/m   General appearance: alert, no distress, well developed, well nourished HEENT: normocephalic, sclerae anicteric, conjunctiva pink and moist, imapctd cerumen  bilat, nares patent, no discharge or erythema, pharynx normal Oral cavity: MMM, no lesions Neck: supple, no lymphadenopathy, no thyromegaly, no masses Heart: RRR, normal S1, S2, no murmurs Lungs: CTA bilaterally, no wheezes, rhonchi, or rales Pulses: 2+ radial pulses, 2+ pedal pulses, normal cap refill Ext: no edema     Assessment: Encounter Diagnoses  Name Primary?   Anxiety and depression Yes   Attention deficit hyperactivity disorder (ADHD), combined type    Pure hypercholesterolemia    Acquired hypothyroidism    Herpes    Abnormal urinalysis    Vitamin D deficiency, unspecified       Plan: One of her goals today was to get off some of her medications to reduce medication burden  Anxiety and  depression history, ADHD history She wants to wean off the medication so she will stop Wellbutrin XL 150 mg and changed to regular release once daily for 1 to 2 weeks then go to every other day for a week and then stop Continue Lexapro 10 mg daily Let me know in a few weeks how this is going  Hypothyroidism Labs today Continue levothyroxine 100 mcg daily  GERD Since she wants to try to come off the medicine she will consider stopping Protonix and continuing famotidine for the time being and see if she tolerates being off of Protonix We discussed risk and benefits of medication in general  History of herpes-refilled Valtrex for as needed use for flareup  Continue vitamin D supplement over-the-counter  Hyperlipidemia - Continue statin  Discussed findings.  Discussed risk/benefits of procedure and patient agrees to procedure. Successfully used warm water lavage to remove impacted cerumen from bilat ear canal. Patient tolerated procedure well. Advised they avoid using any cotton swabs or other devices to clean the ear canals.  Use basic hygiene as discussed.  Follow up prn.   [name] was seen today for med check.  Diagnoses and all orders for this visit:  Anxiety and depression  Attention deficit hyperactivity disorder (ADHD), combined type  Pure hypercholesterolemia  Acquired hypothyroidism -     POCT Urinalysis DIP (Proadvantage Device) -     TSH  Herpes  Abnormal urinalysis -     POCT Urinalysis DIP (Proadvantage Device)  Vitamin D deficiency, unspecified   Other orders -     buPROPion (WELLBUTRIN) 75 MG tablet; Take 1 tablet (75 mg total) by mouth daily. -     valACYclovir (VALTREX) 500 MG tablet; Take 1 tablet (500 mg total) by mouth daily.    Follow up: pending labs

## 2022-05-15 LAB — TSH: TSH: 1.21 u[IU]/mL (ref 0.450–4.500)

## 2022-05-17 ENCOUNTER — Other Ambulatory Visit: Payer: Self-pay | Admitting: Medical

## 2022-05-17 MED ORDER — VALACYCLOVIR HCL 500 MG PO TABS: 500.0000 mg | ORAL_TABLET | Freq: Every day | ORAL | 3 refills | Status: DC

## 2022-05-17 NOTE — Progress Notes (Signed)
Results sent through MyChart

## 2022-06-18 ENCOUNTER — Other Ambulatory Visit: Payer: Self-pay | Admitting: Medical

## 2022-06-18 MED ORDER — BUPROPION HCL 75 MG PO TABS: 75.0000 mg | ORAL_TABLET | Freq: Every day | ORAL | 0 refills | Status: DC

## 2022-06-23 ENCOUNTER — Encounter: Payer: Self-pay | Admitting: Gynecologic Oncology

## 2022-06-24 NOTE — Telephone Encounter (Signed)
LM for Jamie Singh to call back after 0800 tomorrow 06-25-22 to see if she can come to the office and have Melissa exam her. 1 pm would be available tomorrow. Can look at next week if that time does not suit her.

## 2022-06-25 ENCOUNTER — Telehealth: Payer: Self-pay | Admitting: Surgery

## 2022-06-25 NOTE — Telephone Encounter (Signed)
Called patient to check status of itching/bleeding. LVM for patient to call back and sent mychart message as well to see if she can come in today at 1pm to be evaluated by Melissa.

## 2022-06-28 ENCOUNTER — Telehealth: Payer: Self-pay | Admitting: Surgery

## 2022-06-28 NOTE — Telephone Encounter (Signed)
Patient returned call stating she is still having the same symptoms as previously mentioned, (see note). Patient stated she can come in any day this week aside from Friday to be evaluated. Patient advised that our office will call her back with appointment date and time. No other concerns at this time.

## 2022-06-29 ENCOUNTER — Telehealth: Payer: Self-pay | Admitting: *Deleted

## 2022-06-29 ENCOUNTER — Other Ambulatory Visit: Payer: Self-pay | Admitting: Medical

## 2022-06-29 MED ORDER — AMPHETAMINE-DEXTROAMPHETAMINE 10 MG PO TABS: 10.0000 mg | ORAL_TABLET | Freq: Every day | ORAL | 0 refills | Status: DC

## 2022-06-29 NOTE — Telephone Encounter (Signed)
Spoke with the patient and scheduled a follow up appt with Warner Mccreedy APP

## 2022-06-29 NOTE — Telephone Encounter (Signed)
From: Georgina Quint To: Office of Kristian Covey, [place] Sent: 06/29/2022 7:06 AM EDT Subject: Medication Renewal Request  Refills have been requested for the following medications:   amphetamine-dextroamphetamine (ADDERALL) 10 MG tablet [Shane Mamye Bolds]  Preferred pharmacy: [id number] - [place], [place] - [address] Delivery method: [employer]

## 2022-07-05 ENCOUNTER — Inpatient Hospital Stay: Payer: Medicare Other | Attending: Gynecologic Oncology | Admitting: Gynecologic Oncology

## 2022-07-05 VITALS — BP 108/74 | HR 81 | Temp 98.6°F | Resp 16 | Wt 141.4 lb

## 2022-07-05 DIAGNOSIS — Z90722 Acquired absence of ovaries, bilateral: Secondary | ICD-10-CM | POA: Diagnosis not present

## 2022-07-05 DIAGNOSIS — L292 Pruritus vulvae: Secondary | ICD-10-CM | POA: Diagnosis present

## 2022-07-05 DIAGNOSIS — Z90710 Acquired absence of both cervix and uterus: Secondary | ICD-10-CM | POA: Insufficient documentation

## 2022-07-05 DIAGNOSIS — C541 Malignant neoplasm of endometrium: Secondary | ICD-10-CM

## 2022-07-05 DIAGNOSIS — Z923 Personal history of irradiation: Secondary | ICD-10-CM | POA: Diagnosis not present

## 2022-07-05 DIAGNOSIS — N904 Leukoplakia of vulva: Secondary | ICD-10-CM | POA: Diagnosis not present

## 2022-07-05 DIAGNOSIS — N898 Other specified noninflammatory disorders of vagina: Secondary | ICD-10-CM

## 2022-07-05 DIAGNOSIS — Z8542 Personal history of malignant neoplasm of other parts of uterus: Secondary | ICD-10-CM | POA: Insufficient documentation

## 2022-07-05 MED ORDER — CLOBETASOL PROPIONATE 0.05 % EX CREA: TOPICAL_CREAM | CUTANEOUS | 2 refills | Status: DC

## 2022-07-05 NOTE — Progress Notes (Signed)
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  On 08/18/2018, she underwent robotic assisted total laparoscopic hysterectomy, bilateral salpingo-oophorectomy, bilateral sentinel lymph node dissection, and collection of pelvic washings. Pathology revealed a tumor size of 4.5 cm, myometrial invasion 42% (8 of 19 mm), no LVSI.  Was performed at [facility] with Dr. Isabel Caprice.  Patient then received adjuvant vaginal brachytherapy (vaginal cuff HDR, 4 fractions of 5.5 Gray, treated from 8/11-8/21/2020).   The patient was seen for surveillance in [place] with Dr. Modena Jansky, last on 11/2019.  At that time she underwent Pap test that was negative. Her last surveillance exam with our office, Dr. Pricilla Holm, was in April 2023 with no evidence of disease at that time. She was seen in February 2024 with similar symptoms reported today.  Interval History: Patient presents to the office today for re-evaluation of vulvar/vaginal itching/irritation with palpable bumps. She reported some improvement after last visit but states the bumps are back. She continues to swim 5 days a week. She has been removing her bathing suit as soon as she finishes and showers off. The bumps can be painful and bleed when scratched. It has been a very long time since having a herpes outbreak.   Past Medical/Surgical History: Past Medical History:  Diagnosis Date   ADHD    Allergy    Anxiety and depression    Aortic aneurysm (HCC)    Asthma    mild, but prior on preventative FLovent   Endometrial cancer (HCC) 2020   Genital herpes    GERD (gastroesophageal reflux disease)    Hyperlipidemia    Hypothyroidism    Osteoporosis    Thoracic aortic aneurysm (HCC)    Wears hearing aid     Past Surgical History:   Procedure Laterality Date   COLONOSCOPY  2019   ESOPHAGOGASTRODUODENOSCOPY ENDOSCOPY     ROBOTIC ASSISTED TOTAL HYSTERECTOMY WITH BILATERAL SALPINGO OOPHERECTOMY     RA-TLH/BSO   TONSILLECTOMY AND ADENOIDECTOMY     WRIST FRACTURE SURGERY Right     Family History  Problem Relation Age of Onset   COPD Mother    Heart failure Mother    Macular degeneration Mother    Osteoporosis Mother    Cancer Mother        Mom needed a hysterectomy, thinks that she may have had uterine cancer   Heart disease Father        MI   COPD Father    Aortic aneurysm Father    Heart disease Brother        MI   Myelodysplastic syndrome Brother    Osteoporosis Maternal Grandmother    Heart failure Maternal Grandmother    Heart failure Maternal Grandfather    COPD Maternal Grandfather    Stroke Paternal Grandfather    Breast cancer Neg Hx    Colon cancer Neg Hx    Colon polyps Neg Hx    Esophageal cancer Neg Hx    Rectal cancer Neg Hx    Stomach cancer Neg Hx     Social History   Socioeconomic History   Marital status: Married    Spouse name: Not on file   Number of children: Not on file   Years of education: Not on file   Highest education level: Bachelor's degree (e.g., BA, AB, BS)  Occupational History   Occupation: Hydrographic surveyor - retired  Tobacco Use   Smoking status: Never   Smokeless tobacco: Never  Vaping Use   Vaping Use: Never used  Substance and Sexual Activity   Alcohol use: Not Currently    Alcohol/week: 2.0 - 3.0 standard drinks of alcohol    Types: 2 - 3 Glasses of wine per week    Comment: 2-4 times monthly   Drug use: Yes    Types: Marijuana    Comment: gummies   Sexual activity: Not Currently  Other Topics Concern   Not on file  Social History Narrative   Lives with husband and dog.  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   Social Determinants of Health   Financial Resource Strain: Low Risk  (06/01/2021)   Overall Financial Resource Strain (CARDIA)    Difficulty  of Paying Living Expenses: Not hard at all  Food Insecurity: No Food Insecurity (06/01/2021)   Hunger Vital Sign    Worried About Running Out of Food in the Last Year: Never true    Ran Out of Food in the Last Year: Never true  Transportation Needs: No Transportation Needs (06/01/2021)   PRAPARE - Administrator, Civil Service (Medical): No    Lack of Transportation (Non-Medical): No  Physical Activity: Sufficiently Active (06/01/2021)   Exercise Vital Sign    Days of Exercise per Week: 4 days    Minutes of Exercise per Session: 60 min  Stress: Stress Concern Present (06/01/2021)   [employer] of Occupational Health - Occupational Stress Questionnaire    Feeling of Stress : To some extent  Social Connections: Moderately Isolated (06/01/2021)   Social Connection and Isolation Panel [NHANES]    Frequency of Communication with Friends and Family: More than three times a week    Frequency of Social Gatherings with Friends and Family: Once a week    Attends Religious Services: Never    Database administrator or Organizations: No    Attends Engineer, structural: Not on file    Marital Status: Married    Current Medications:  Current Outpatient Medications:    clobetasol cream (TEMOVATE) 0.05 %, Apply to the affected area at the entrance of the vagina at bedtime daily for 2 weeks followed by estrogen cream three times a week at bedtime., Disp: 30 g, Rfl: 2   albuterol (VENTOLIN HFA) 108 (90 Base) MCG/ACT inhaler, Inhale 2 puffs into the lungs every 6 (six) hours as needed for wheezing or shortness of breath., Disp: 3 each, Rfl: 0   amphetamine-dextroamphetamine (ADDERALL) 10 MG tablet, Take 1 tablet (10 mg total) by mouth daily with breakfast., Disp: 90 tablet, Rfl: 0   buPROPion (WELLBUTRIN) 75 MG tablet, Take 1 tablet (75 mg total) by mouth daily., Disp: 90 tablet, Rfl: 0   Calcium-Magnesium 250-125 MG TABS, Take 2 tablets by mouth daily., Disp: , Rfl:    cholecalciferol  (VITAMIN D) 25 MCG (1000 UNIT) tablet, TAKE 1 TABLET EVERY DAY, Disp: 90 tablet, Rfl: 0   escitalopram (LEXAPRO) 10 MG tablet, Take 1 tablet (10 mg total) by mouth daily., Disp: 90 tablet, Rfl: 1   estradiol (ESTRACE VAGINAL) 0.1 MG/GM vaginal cream, Use small fingertip size amount of cream at the entrance of the vagina at bedtime three times a week for the next two weeks, Disp: 42.5 g, Rfl: 0   famotidine (PEPCID) 20 MG tablet, Take 1 tablet (20 mg total) by mouth at bedtime., Disp: 90 tablet, Rfl:  1   levothyroxine (SYNTHROID) 100 MCG tablet, Take 1 tablet (100 mcg total) by mouth daily., Disp: 90 tablet, Rfl: 1   pantoprazole (PROTONIX) 40 MG tablet, Take 1 tablet (40 mg total) by mouth daily., Disp: 90 tablet, Rfl: 1   PSYLLIUM HUSK PO, Take 2,000 mg by mouth daily., Disp: , Rfl:    rosuvastatin (CRESTOR) 10 MG tablet, Take 1 tablet (10 mg total) by mouth daily., Disp: 90 tablet, Rfl: 1   valACYclovir (VALTREX) 500 MG tablet, Take 1 tablet (500 mg total) by mouth daily., Disp: 90 tablet, Rfl: 3  Review of Systems: See interval. Additional ROS negative.  Physical Exam: BP 108/74 (BP Location: Left Arm, Patient Position: Sitting)   Pulse 81   Temp 98.6 F (37 C)   Resp 16   Wt 141 lb 6.4 oz (64.1 kg)   SpO2 100%   BMI 28.56 kg/m  General: Alert, oriented, no acute distress. HEENT: Normocephalic, atraumatic, sclera anicteric. Chest: Clear to auscultation bilaterally.  No wheezes or rhonchi. Cardiovascular: Regular rate and rhythm, no murmurs. Extremities: Grossly normal range of motion.  Warm, well perfused.  No edema bilaterally. Skin: No rashes noted. Lymphatics: No inguinal adenopathy. GU: Normal appearing external genitalia except for mild erythema and whitened tissue at the introitus. Compared with previous exam, the area of leukoplakia on the left introitus region has slightly increased. No drainage from these areas. Radiation changes noted. No masses, bleeding prior to exam, or  discharge noted in the vagina. Light bleeding on the right vulva in response to digital examination.   Laboratory & Radiologic Studies: None new  Assessment & Plan: AAROHI Singh is a 72 y.o. 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   Patient presents today for a symptom management visit due to vulvar itching, rash, and irritation. The area of concern was previously biopsied and returned with hyperkeratosis and parakeratosis, negative for dysplasia or malignancy. No findings concerning for recurrence on today's exam.    We will plan on starting topical clobetasol cream at the entrance of the vagina. She is advised to use this once a day at bedtime on the skin bumps at the introitus for the next two weeks followed by applying topical estrogen three times a week to the area for two weeks. We will see her back in the office to re-evaluate after this time. Patient given topical lidocaine as well to apply topically as needed.   Peri care discussed. Reportable signs and symptoms reviewed. She is advised to call for any needs, worsening symptoms.   Per NCCN surveillance recommendations, we will continue to recommend visits every 6 months.    20 minutes of total time was spent for this patient encounter, including preparation, face-to-face counseling with the patient and coordination of care, and documentation of the encounter.  Warner Mccreedy, NP Gynecologic Oncology

## 2022-07-05 NOTE — Patient Instructions (Signed)
Plan to begin using topical clobetasol cream at the entrance of the vagina. Plan to use this once a day at bedtime after you have cleansed the area and the area is completely dry. Plan to do this for the next two weeks.  After two weeks, plan on using the estrogen three times a week to the area.   We will see you back in the office in one month or sooner if needed.    Plan on using the peri bottle and opening the labia after toileting and after swimming to get to the affected area to cleanse.  Please call if your symptoms worsen so we can see you sooner.

## 2022-07-09 ENCOUNTER — Telehealth: Payer: Self-pay | Admitting: *Deleted

## 2022-07-09 NOTE — Telephone Encounter (Signed)
Jamie Singh was called per Warner Mccreedy, NP to follow up on her symptoms. Pt states she still has the bumps but they are not as pronounced. Pt denies pain. She states, "I believe the new cream that was prescribed is working"  Pt is still using her peri bottle at home and is using one after she gets out of the pool as well. Itching has decreased. Reviewed instructions for the steroid cream to alternate with estrogen cream per Warner Mccreedy, NP's progress note.  Pt advised to call the office with worsening symptoms or any other concerns.

## 2022-07-29 ENCOUNTER — Telehealth: Payer: Self-pay | Admitting: *Deleted

## 2022-07-29 NOTE — Telephone Encounter (Signed)
Spoke with Jamie Singh in regards to her symptoms. Patient states she still has the bumps and they haven't changed in size or appearance. The itching has improved some. States she has been using the clobetasol for 2.5 weeks. Reiterated message relayed to patientfrom Warner Mccreedy, NP in regards to her symptoms persisting may be worth having another biopsy of the area to assess for any changes, patient agreed to this and will discuss with provider at her June 4th. Appointment.

## 2022-08-02 ENCOUNTER — Encounter: Payer: Self-pay | Admitting: Gynecologic Oncology

## 2022-08-03 ENCOUNTER — Other Ambulatory Visit: Payer: Self-pay

## 2022-08-03 ENCOUNTER — Telehealth: Payer: Self-pay | Admitting: *Deleted

## 2022-08-03 ENCOUNTER — Other Ambulatory Visit (HOSPITAL_COMMUNITY): Payer: Self-pay

## 2022-08-03 ENCOUNTER — Inpatient Hospital Stay: Payer: Medicare Other

## 2022-08-03 ENCOUNTER — Inpatient Hospital Stay: Payer: Medicare Other | Attending: Gynecologic Oncology | Admitting: Gynecologic Oncology

## 2022-08-03 VITALS — BP 120/89 | HR 79 | Temp 99.0°F | Resp 18 | Wt 137.0 lb

## 2022-08-03 DIAGNOSIS — N9089 Other specified noninflammatory disorders of vulva and perineum: Secondary | ICD-10-CM | POA: Diagnosis present

## 2022-08-03 DIAGNOSIS — L292 Pruritus vulvae: Secondary | ICD-10-CM | POA: Diagnosis not present

## 2022-08-03 DIAGNOSIS — Z90710 Acquired absence of both cervix and uterus: Secondary | ICD-10-CM | POA: Insufficient documentation

## 2022-08-03 DIAGNOSIS — A609 Anogenital herpesviral infection, unspecified: Secondary | ICD-10-CM | POA: Diagnosis not present

## 2022-08-03 DIAGNOSIS — Z90722 Acquired absence of ovaries, bilateral: Secondary | ICD-10-CM | POA: Diagnosis not present

## 2022-08-03 DIAGNOSIS — C541 Malignant neoplasm of endometrium: Secondary | ICD-10-CM

## 2022-08-03 DIAGNOSIS — Z923 Personal history of irradiation: Secondary | ICD-10-CM | POA: Insufficient documentation

## 2022-08-03 DIAGNOSIS — N894 Leukoplakia of vagina: Secondary | ICD-10-CM

## 2022-08-03 DIAGNOSIS — N763 Subacute and chronic vulvitis: Secondary | ICD-10-CM

## 2022-08-03 DIAGNOSIS — Z8542 Personal history of malignant neoplasm of other parts of uterus: Secondary | ICD-10-CM | POA: Insufficient documentation

## 2022-08-03 DIAGNOSIS — J3489 Other specified disorders of nose and nasal sinuses: Secondary | ICD-10-CM

## 2022-08-03 LAB — WET PREP, GENITAL
Clue Cells Wet Prep HPF POC: NONE SEEN
Sperm: NONE SEEN
Trich, Wet Prep: NONE SEEN
WBC, Wet Prep HPF POC: <10 (ref <10)
Yeast Wet Prep HPF POC: NONE SEEN

## 2022-08-03 LAB — AEROBIC CULTURE W GRAM STAIN (SUPERFICIAL SPECIMEN)

## 2022-08-03 MED ORDER — TACROLIMUS 0.1 % EX OINT
TOPICAL_OINTMENT | Freq: Two times a day (BID) | CUTANEOUS | 0 refills | Status: DC
Start: 2022-08-03 — End: 2022-09-28
  Filled 2022-08-03: qty 30, 42d supply, fill #0
  Filled 2022-08-25: qty 30, 30d supply, fill #0

## 2022-08-03 NOTE — Progress Notes (Signed)
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  On 08/18/2018, she underwent robotic assisted total laparoscopic hysterectomy, bilateral salpingo-oophorectomy, bilateral sentinel lymph node dissection, and collection of pelvic washings. Pathology revealed a tumor size of 4.5 cm, myometrial invasion 42% (8 of 19 mm), no LVSI.  Was performed at [facility] with Dr. Isabel Caprice.  Patient then received adjuvant vaginal brachytherapy (vaginal cuff HDR, 4 fractions of 5.5 Gray, treated from 8/11-8/21/2020).   The patient was seen for surveillance in [place] with Dr. Modena Jansky, last on 11/2019.  At that time she underwent Pap test that was negative. She was last seen at the beginning of May 2024 with similar symptoms reported today.  Previous biopsy from 04/2022 resulting vulvar/vaginal hyperkeratosis and parakeratosis. Given persistent symptoms and increasing size, plan for re-biopsying the area per Dr. Pricilla Holm.  Interval History: Patient presents to the office today for re-evaluation of vulvar/vaginal itching/irritation with palpable bumps. She has intermittent itching in this area. She recently scratched the vulva with a towel then had spotting. She feels the vulvar lesions have increased in size and she did not tell a significant difference with clobetasol use. Bladder functioning at baseline. Bowel movements normal for her. No vaginal spotting or discharge except for after scratching. She also reports having nasal lesions/sores, left one resolving. She has a history of herpes and feels the nasal lesions may be related to this. She has not had a genital outbreak in a long period of time.   She reports doing well otherwise. She has plans to travel to [place] and through [place].  She has decided to take a break from swimming to see if her vulvar symptoms improve.   Past Medical/Surgical History: Past Medical History:  Diagnosis Date   ADHD    Allergy    Anxiety and depression    Aortic aneurysm (HCC)    Asthma    mild, but prior on preventative FLovent   Endometrial cancer (HCC) 2020   Genital herpes    GERD (gastroesophageal reflux disease)    Hyperlipidemia    Hypothyroidism    Osteoporosis    Thoracic aortic aneurysm (HCC)    Wears hearing aid     Past Surgical History:  Procedure Laterality Date   COLONOSCOPY  2019   ESOPHAGOGASTRODUODENOSCOPY ENDOSCOPY     ROBOTIC ASSISTED TOTAL HYSTERECTOMY WITH BILATERAL SALPINGO OOPHERECTOMY     RA-TLH/BSO   TONSILLECTOMY AND ADENOIDECTOMY     WRIST FRACTURE SURGERY Right     Family History  Problem Relation Age of Onset   COPD Mother    Heart failure Mother    Macular degeneration Mother    Osteoporosis Mother    Cancer Mother        Mom needed a hysterectomy, thinks that she may have had uterine cancer   Heart disease Father        MI   COPD Father    Aortic aneurysm Father    Heart disease Brother        MI   Myelodysplastic syndrome Brother    Osteoporosis Maternal Grandmother    Heart failure Maternal Grandmother    Heart failure Maternal Grandfather    COPD Maternal Grandfather    Stroke Paternal Grandfather    Breast cancer Neg Hx    Colon cancer Neg Hx  Colon polyps Neg Hx    Esophageal cancer Neg Hx    Rectal cancer Neg Hx    Stomach cancer Neg Hx     Social History   Socioeconomic History   Marital status: Married    Spouse name: Not on file   Number of children: Not on file   Years of education: Not on file   Highest education level: Bachelor's degree (e.g., BA, AB, BS)  Occupational History   Occupation: Hydrographic surveyor - retired  Tobacco Use   Smoking status: Never   Smokeless tobacco: Never  Vaping Use   Vaping Use: Never used  Substance and Sexual Activity    Alcohol use: Not Currently    Alcohol/week: 2.0 - 3.0 standard drinks of alcohol    Types: 2 - 3 Glasses of wine per week    Comment: 2-4 times monthly   Drug use: Yes    Types: Marijuana    Comment: gummies   Sexual activity: Not Currently  Other Topics Concern   Not on file  Social History Narrative   Lives with husband and dog.  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   Social Determinants of Health   Financial Resource Strain: Low Risk  (06/01/2021)   Overall Financial Resource Strain (CARDIA)    Difficulty of Paying Living Expenses: Not hard at all  Food Insecurity: No Food Insecurity (06/01/2021)   Hunger Vital Sign    Worried About Running Out of Food in the Last Year: Never true    Ran Out of Food in the Last Year: Never true  Transportation Needs: No Transportation Needs (06/01/2021)   PRAPARE - Administrator, Civil Service (Medical): No    Lack of Transportation (Non-Medical): No  Physical Activity: Sufficiently Active (06/01/2021)   Exercise Vital Sign    Days of Exercise per Week: 4 days    Minutes of Exercise per Session: 60 min  Stress: Stress Concern Present (06/01/2021)   [employer] of Occupational Health - Occupational Stress Questionnaire    Feeling of Stress : To some extent  Social Connections: Moderately Isolated (06/01/2021)   Social Connection and Isolation Panel [NHANES]    Frequency of Communication with Friends and Family: More than three times a week    Frequency of Social Gatherings with Friends and Family: Once a week    Attends Religious Services: Never    Database administrator or Organizations: No    Attends Engineer, structural: Not on file    Marital Status: Married    Current Medications:  Current Outpatient Medications:    albuterol (VENTOLIN HFA) 108 (90 Base) MCG/ACT inhaler, Inhale 2 puffs into the lungs every 6 (six) hours as needed for wheezing or shortness of breath., Disp: 3 each, Rfl: 0    amphetamine-dextroamphetamine (ADDERALL) 10 MG tablet, Take 1 tablet (10 mg total) by mouth daily with breakfast., Disp: 90 tablet, Rfl: 0   buPROPion (WELLBUTRIN) 75 MG tablet, Take 1 tablet (75 mg total) by mouth daily., Disp: 90 tablet, Rfl: 0   Calcium-Magnesium 250-125 MG TABS, Take 2 tablets by mouth daily., Disp: , Rfl:    cholecalciferol (VITAMIN D) 25 MCG (1000 UNIT) tablet, TAKE 1 TABLET EVERY DAY, Disp: 90 tablet, Rfl: 0   clobetasol cream (TEMOVATE) 0.05 %, Apply to the affected area at the entrance of the vagina at bedtime daily for 2 weeks followed by estrogen cream three times a week at bedtime., Disp: 30 g, Rfl: 2   escitalopram (LEXAPRO)  10 MG tablet, Take 1 tablet (10 mg total) by mouth daily., Disp: 90 tablet, Rfl: 1   estradiol (ESTRACE VAGINAL) 0.1 MG/GM vaginal cream, Use small fingertip size amount of cream at the entrance of the vagina at bedtime three times a week for the next two weeks, Disp: 42.5 g, Rfl: 0   famotidine (PEPCID) 20 MG tablet, Take 1 tablet (20 mg total) by mouth at bedtime., Disp: 90 tablet, Rfl: 1   levothyroxine (SYNTHROID) 100 MCG tablet, Take 1 tablet (100 mcg total) by mouth daily., Disp: 90 tablet, Rfl: 1   PSYLLIUM HUSK PO, Take 2,000 mg by mouth daily., Disp: , Rfl:    rosuvastatin (CRESTOR) 10 MG tablet, Take 1 tablet (10 mg total) by mouth daily., Disp: 90 tablet, Rfl: 1   tacrolimus (PROTOPIC) 0.1 % ointment, Apply topically 2 (two) times daily to the vulva at the entrance of the vagina for 6 weeks, Disp: 30 g, Rfl: 0   valACYclovir (VALTREX) 500 MG tablet, Take 1 tablet (500 mg total) by mouth daily., Disp: 90 tablet, Rfl: 3   pantoprazole (PROTONIX) 40 MG tablet, Take 1 tablet (40 mg total) by mouth daily. (Patient not taking: Reported on 08/02/2022), Disp: 90 tablet, Rfl: 1  Review of Systems: See interval. Additional ROS negative.  Physical Exam: BP 120/89 (BP Location: Right Arm, Patient Position: Sitting)   Pulse 79   Temp 99 F (37.2 C)  (Oral)   Resp 18   Wt 137 lb (62.1 kg)   SpO2 99%   BMI 27.67 kg/m  General: Alert, oriented, no acute distress. HEENT: Normocephalic, atraumatic, sclera anicteric. Chest: Clear to auscultation bilaterally.  No wheezes or rhonchi. Cardiovascular: Regular rate and rhythm, no murmurs. Extremities: Grossly normal range of motion.  Warm, well perfused.  No edema bilaterally. Skin: No rashes noted. Lymphatics: No inguinal adenopathy. GU: Normal appearing external genitalia except for persistent whitened tissue/raised lesions at the right and left introitus. On the right vulva at the introitus, the skin is broken and raw most likely from recent scratching, minimal amount of leukoplakia present. Compared with previous exam, the area of leukoplakia on the left introitus region has slightly increased. No drainage from these areas. Radiation changes noted. No masses, bleeding prior to exam, or discharge noted in the vagina. Light bleeding on the right vulva in response to digital examination.  Given persistent lesions increasing in size and gyn oncology history, plan for rebiopsing of the areas on the right and left vulva. Culture swab obtained from the open skin area on the right vulva along with a HSV swab. The vulva is also swabbed for a wet prep to test for candidiasis. A nasal swab is also obtained given nasal sores to guide treatment.  Vulvar/vaginal biopsy procedure Preoperative diagnosis: Vulvar symptoms including itching, irritation, and rash. 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  She was then placed in dorsolithotomy position the vulvar area to be biopsy was examined with findings as above.  The area was cleansed with Betadine x 3 with  plan for biopsy of the whitened, raised area at the left and right of the introitus. 1 cc of 1% lidocaine was then injected total.  After adequate time for lidocaine, a 3 mm punch biopsy was taken of the left and right vulvar  lesions.  These were placed in formalin.  Silver nitrate and pressure were used to achieve hemostasis.  Overall the patient tolerated the procedure well.  Laboratory & Radiologic Studies: None new  Assessment & Plan: Jamie Singh is a 72 y.o. 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   Patient presents today for a symptom management visit due to increasing vulvar lesions, vulvar itching, rash, and irritation. The area of concern was previously biopsied and returned with hyperkeratosis and parakeratosis, negative for dysplasia or malignancy in Feb 2024 but given persistence and increasing size, areas re-biopsied today. No further findings concerning for recurrence on today's exam.     She will be contacted with the results of testing from today when available. Peri care discussed. Reportable signs and symptoms reviewed. She is advised to call for any needs, worsening symptoms.   Per NCCN surveillance recommendations, we will continue to recommend visits every 6 months.    20 minutes of total time was spent for this patient encounter, including preparation, face-to-face counseling with the patient and coordination of care, and documentation of the encounter.  Warner Mccreedy, NP Gynecologic Oncology

## 2022-08-03 NOTE — Telephone Encounter (Signed)
[name] called in regards to price of Tacrolimus. States the cost to patient will be $119.10, message relayed to Warner Mccreedy, NP.

## 2022-08-03 NOTE — Patient Instructions (Signed)
Today we took biopsies from the right and left vulva. You may have some spotting from this or grayish discharge from the medication used to stop bleeding.   We also took viral swabs from your vulva and nose, wet prep of the vulva to test for yeast, and a culture swab from open area on the right vulva.   We would recommend avoiding the swimming pool if possible to see how your symptoms are.   You can use vaseline to the area on the vulva while we are waiting for results. Use the peri bottle after toileting, make sure the skin is very dry, then apply vaseline to the skin. You will need to do this several times a day.  We will start the authorization process for the anti-inflammatory cream, tacrolimus. We will let you know what your insurance says.

## 2022-08-04 ENCOUNTER — Other Ambulatory Visit (HOSPITAL_COMMUNITY): Payer: Self-pay

## 2022-08-04 ENCOUNTER — Telehealth: Payer: Self-pay | Admitting: *Deleted

## 2022-08-04 NOTE — Telephone Encounter (Signed)
-----   Message from Doylene Bode, NP sent at 08/03/2022  4:53 PM EDT ----- Thank you for checking. When I spoke with the patient, she didn't want to spend that much so we will keep on hold. ----- Message ----- From: Shellee Milo, RN Sent: 08/03/2022   4:37 PM EDT To: Doylene Bode, NP  Just spoke with [facility], the Rx will be $119.10 ----- Message ----- From: Doylene Bode, NP Sent: 08/03/2022   4:16 PM EDT To: Shellee Milo, RN; Kellie Moor, LPN  I sent in the cream, tacrolimus, to the [facility] for them to run with her insurance and check price. Could someone call over there and see how much it would be? They can just keep it on file and not fill it

## 2022-08-04 NOTE — Telephone Encounter (Signed)
[name] called in regards to Jamie Singh's Rx for tacrolimus. They will keep the Rx. On hold for the patient until she is ready or decides when to pick it up.

## 2022-08-05 LAB — HSV 1/2 PCR (SURFACE)
HSV-1 DNA: DETECTED — AB
HSV-2 DNA: NOT DETECTED

## 2022-08-05 LAB — AEROBIC CULTURE W GRAM STAIN (SUPERFICIAL SPECIMEN): Gram Stain: NONE SEEN

## 2022-08-05 LAB — SURGICAL PATHOLOGY

## 2022-08-06 ENCOUNTER — Encounter: Payer: Self-pay | Admitting: Gynecologic Oncology

## 2022-08-06 ENCOUNTER — Other Ambulatory Visit: Payer: Self-pay | Admitting: Gynecologic Oncology

## 2022-08-06 DIAGNOSIS — N9089 Other specified noninflammatory disorders of vulva and perineum: Secondary | ICD-10-CM

## 2022-08-06 LAB — HSV 1/2 PCR (SURFACE)
HSV-1 DNA: NOT DETECTED
HSV-2 DNA: NOT DETECTED

## 2022-08-10 ENCOUNTER — Ambulatory Visit (INDEPENDENT_AMBULATORY_CARE_PROVIDER_SITE_OTHER): Payer: Medicare Other

## 2022-08-10 VITALS — Ht 59.0 in | Wt 137.0 lb

## 2022-08-10 DIAGNOSIS — Z0000 Encounter for general adult medical examination without abnormal findings: Secondary | ICD-10-CM | POA: Diagnosis not present

## 2022-08-10 NOTE — Progress Notes (Signed)
I connected with  Jamie Singh on 08/10/22 by a audio enabled telemedicine application and verified that I am speaking with the correct person using two identifiers.  Patient Location: Home  Provider Location: Office/Clinic  I discussed the limitations of evaluation and management by telemedicine. The patient expressed understanding and agreed to proceed. Subjective:   Jamie Singh is a 72 y.o. female who presents for Medicare Annual (Subsequent) preventive examination.  Patient Medicare AWV questionnaire was completed by the patient on 08/07/2022; I have confirmed that all information answered by patient is correct and no changes since this date.    Review of Systems     Cardiac Risk Factors include: advanced age (>55men, >59 women)     Objective:    Today's Vitals   08/10/22 0942  Weight: 137 lb (62.1 kg)  Height: 4\' 11"  (1.499 m)   Body mass index is 27.67 kg/m.     08/10/2022    9:47 AM 03/06/2021    9:13 AM 08/22/2020   11:51 AM 03/30/2020   10:50 AM 02/11/2020   11:33 AM  Advanced Directives  Does Patient Have a Medical Advance Directive? Yes Yes Yes No Yes  Type of Estate agent of [employer];Living will Healthcare Power of [place];Living will Healthcare Power of [place];Living will  Living will;Healthcare Power of Attorney  Does patient want to make changes to medical advance directive?     No - Patient declined  Copy of Healthcare Power of Attorney in Chart? No - copy requested No - copy requested No - copy requested  No - copy requested    Current Medications (verified) Outpatient Encounter Medications as of 08/10/2022  Medication Sig   albuterol (VENTOLIN HFA) 108 (90 Base) MCG/ACT inhaler Inhale 2 puffs into the lungs every 6 (six) hours as needed for wheezing or shortness of breath.   amphetamine-dextroamphetamine (ADDERALL) 10 MG tablet Take 1 tablet (10 mg total) by mouth daily with breakfast.   buPROPion (WELLBUTRIN) 75 MG tablet  Take 1 tablet (75 mg total) by mouth daily.   Calcium-Magnesium 250-125 MG TABS Take 2 tablets by mouth daily.   cholecalciferol (VITAMIN D) 25 MCG (1000 UNIT) tablet TAKE 1 TABLET EVERY DAY   clobetasol cream (TEMOVATE) 0.05 % Apply to the affected area at the entrance of the vagina at bedtime daily for 2 weeks followed by estrogen cream three times a week at bedtime.   escitalopram (LEXAPRO) 10 MG tablet Take 1 tablet (10 mg total) by mouth daily.   estradiol (ESTRACE VAGINAL) 0.1 MG/GM vaginal cream Use small fingertip size amount of cream at the entrance of the vagina at bedtime three times a week for the next two weeks   famotidine (PEPCID) 20 MG tablet Take 1 tablet (20 mg total) by mouth at bedtime.   levothyroxine (SYNTHROID) 100 MCG tablet Take 1 tablet (100 mcg total) by mouth daily.   PSYLLIUM HUSK PO Take 2,000 mg by mouth daily.   rosuvastatin (CRESTOR) 10 MG tablet Take 1 tablet (10 mg total) by mouth daily.   tacrolimus (PROTOPIC) 0.1 % ointment Apply topically 2 (two) times daily to the vulva at the entrance of the vagina for 6 weeks   valACYclovir (VALTREX) 500 MG tablet Take 1 tablet (500 mg total) by mouth daily.   pantoprazole (PROTONIX) 40 MG tablet Take 1 tablet (40 mg total) by mouth daily. (Patient not taking: Reported on 08/02/2022)   No facility-administered encounter medications on file as of 08/10/2022.    Allergies (  verified) Pollen extract and Quinolones   History: Past Medical History:  Diagnosis Date   ADHD    Allergy    Anxiety and depression    Aortic aneurysm (HCC)    Asthma    mild, but prior on preventative FLovent   Endometrial cancer (HCC) 2020   Genital herpes    GERD (gastroesophageal reflux disease)    Hyperlipidemia    Hypothyroidism    Osteoporosis    Thoracic aortic aneurysm (HCC)    Wears hearing aid    Past Surgical History:  Procedure Laterality Date   COLONOSCOPY  2019   ESOPHAGOGASTRODUODENOSCOPY ENDOSCOPY     ROBOTIC ASSISTED  TOTAL HYSTERECTOMY WITH BILATERAL SALPINGO OOPHERECTOMY     RA-TLH/BSO   TONSILLECTOMY AND ADENOIDECTOMY     WRIST FRACTURE SURGERY Right    Family History  Problem Relation Age of Onset   COPD Mother    Heart failure Mother    Macular degeneration Mother    Osteoporosis Mother    Cancer Mother        Mom needed a hysterectomy, thinks that she may have had uterine cancer   Heart disease Father        MI   COPD Father    Aortic aneurysm Father    Heart disease Brother        MI   Myelodysplastic syndrome Brother    Osteoporosis Maternal Grandmother    Heart failure Maternal Grandmother    Heart failure Maternal Grandfather    COPD Maternal Grandfather    Stroke Paternal Grandfather    Breast cancer Neg Hx    Colon cancer Neg Hx    Colon polyps Neg Hx    Esophageal cancer Neg Hx    Rectal cancer Neg Hx    Stomach cancer Neg Hx    Social History   Socioeconomic History   Marital status: Married    Spouse name: Not on file   Number of children: Not on file   Years of education: Not on file   Highest education level: Bachelor's degree (e.g., BA, AB, BS)  Occupational History   Occupation: Hydrographic surveyor - retired  Tobacco Use   Smoking status: Never   Smokeless tobacco: Never  Vaping Use   Vaping Use: Never used  Substance and Sexual Activity   Alcohol use: Not Currently    Alcohol/week: 2.0 - 3.0 standard drinks of alcohol    Types: 2 - 3 Glasses of wine per week    Comment: 2-4 times monthly   Drug use: Yes    Types: Marijuana    Comment: gummies   Sexual activity: Not Currently  Other Topics Concern   Not on file  Social History Narrative   Lives with husband and dog.  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   Social Determinants of Health   Financial Resource Strain: Low Risk  (08/07/2022)   Overall Financial Resource Strain (CARDIA)    Difficulty of Paying Living Expenses: Not hard at all  Food Insecurity: No Food Insecurity (08/07/2022)   Hunger Vital Sign     Worried About Running Out of Food in the Last Year: Never true    Ran Out of Food in the Last Year: Never true  Transportation Needs: No Transportation Needs (08/07/2022)   PRAPARE - Administrator, Civil Service (Medical): No    Lack of Transportation (Non-Medical): No  Physical Activity: Sufficiently Active (08/07/2022)   Exercise Vital Sign    Days of Exercise per Week:  4 days    Minutes of Exercise per Session: 60 min  Stress: No Stress Concern Present (08/07/2022)   [employer] of Occupational Health - Occupational Stress Questionnaire    Feeling of Stress : Only a little  Social Connections: Unknown (08/07/2022)   Social Connection and Isolation Panel [NHANES]    Frequency of Communication with Friends and Family: More than three times a week    Frequency of Social Gatherings with Friends and Family: Three times a week    Attends Religious Services: Not on file    Active Member of Clubs or Organizations: No    Attends Banker Meetings: Never    Marital Status: Married    Tobacco Counseling Counseling given: Not Answered   Clinical Intake:  Pre-visit preparation completed: Yes  Pain : No/denies pain     Nutritional Status: BMI 25 -29 Overweight Nutritional Risks: None Diabetes: No  How often do you need to have someone help you when you read instructions, pamphlets, or other written materials from your doctor or pharmacy?: 1 - Never  Diabetic? no  Interpreter Needed?: No  Information entered by :: NAllen LPN   Activities of Daily Living    08/07/2022    7:40 AM  In your present state of health, do you have any difficulty performing the following activities:  Hearing? 0  Vision? 0  Difficulty concentrating or making decisions? 0  Walking or climbing stairs? 0  Dressing or bathing? 0  Doing errands, shopping? 0  Preparing Food and eating ? N  Using the Toilet? N  In the past six months, have you accidently leaked urine? Y  Do  you have problems with loss of bowel control? N  Managing your Medications? N  Managing your Finances? N  Housekeeping or managing your Housekeeping? N    Patient Care Team: Tysinger, Kermit Balo, PA-C as PCP - General (Family Medicine)  Indicate any recent Medical Services you may have received from other than [facility] providers in the past year (date may be approximate).     Assessment:   This is a routine wellness examination for [place].  Hearing/Vision screen Vision Screening - Comments:: Regular eye exams, [facility], Dr. Blanchard Kelch  Dietary issues and exercise activities discussed: Current Exercise Habits: Home exercise routine, Type of exercise: walking, Time (Minutes): 60, Frequency (Times/Week): 4, Weekly Exercise (Minutes/Week): 240   Goals Addressed             This Visit's Progress    Patient Stated       08/10/2022, wants to build up stamina       Depression Screen    08/10/2022    9:48 AM 06/02/2021    1:50 PM 03/06/2021    9:15 AM 12/09/2020    9:55 AM 08/20/2020   10:03 AM 02/20/2020    1:48 PM 10/18/2019    9:57 AM  PHQ 2/9 Scores  PHQ - 2 Score 0 0 0 0 0 2 1  PHQ- 9 Score 0     7 5    Fall Risk    08/07/2022    7:40 AM 06/02/2021    1:49 PM 03/06/2021    9:14 AM 03/05/2021    4:02 PM 02/16/2021    8:55 AM  Fall Risk   Falls in the past year? 0 0 1 1 1   Comment   slipped    Number falls in past yr: 0 0 1 0 0  Injury with Fall? 0 0 1 1  1  Comment   broke shoulder    Risk for fall due to : Medication side effect No Fall Risks Medication side effect    Follow up Falls prevention discussed;Education provided;Falls evaluation completed Falls evaluation completed Falls evaluation completed;Education provided;Falls prevention discussed      FALL RISK PREVENTION PERTAINING TO THE HOME:  Any stairs in or around the home? Yes  If so, are there any without handrails? No  Home free of loose throw rugs in walkways, pet beds, electrical cords, etc? Yes  Adequate lighting  in your home to reduce risk of falls? Yes   ASSISTIVE DEVICES UTILIZED TO PREVENT FALLS:  Life alert? No  Use of a cane, walker or w/c? No  Grab bars in the bathroom? No  Shower chair or bench in shower? No  Elevated toilet seat or a handicapped toilet? Yes   TIMED UP AND GO:  Was the test performed? No .       Cognitive Function:        08/10/2022    9:50 AM 03/06/2021    9:17 AM  6CIT Screen  What Year? 0 points 0 points  What month? 0 points 0 points  What time? 0 points 0 points  Count back from 20 0 points 0 points  Months in reverse 0 points 0 points  Repeat phrase 0 points 2 points  Total Score 0 points 2 points    Immunizations Immunization History  Administered Date(s) Administered   Influenza, High Dose Seasonal PF 11/28/2019, 11/26/2020   Influenza-Unspecified 12/21/2016, 12/01/2018, 10/30/2021   PFIZER(Purple Top)SARS-COV-2 Vaccination 03/24/2019, 04/16/2019, 12/12/2019   PNEUMOCOCCAL CONJUGATE-20 03/06/2021   Pfizer Covid-19 Vaccine Bivalent Booster 55yrs & up 11/26/2020   Pneumococcal Conjugate-13 02/06/2018   Tdap 06/02/2021   Zoster Recombinat (Shingrix) 12/01/2018, 05/08/2019    TDAP status: Up to date  Flu Vaccine status: Up to date  Pneumococcal vaccine status: Up to date  Covid-19 vaccine status: Completed vaccines  Qualifies for Shingles Vaccine? Yes   Zostavax completed Yes   Shingrix Completed?: Yes  Screening Tests Health Maintenance  Topic Date Due   COVID-19 Vaccine (5 - 2023-24 season) 10/30/2021   Medicare Annual Wellness (AWV)  03/06/2022   INFLUENZA VACCINE  09/30/2022   MAMMOGRAM  12/24/2023   Colonoscopy  12/31/2028   DTaP/Tdap/Td (2 - Td or Tdap) 06/03/2031   Pneumonia Vaccine 28+ Years old  Completed   DEXA SCAN  Completed   Hepatitis C Screening  Completed   Zoster Vaccines- Shingrix  Completed   HPV VACCINES  Aged Out    Health Maintenance  Health Maintenance Due  Topic Date Due   COVID-19 Vaccine (5 -  2023-24 season) 10/30/2021   Medicare Annual Wellness (AWV)  03/06/2022    Colorectal cancer screening: Type of screening: Colonoscopy. Completed 12/31/2021. Repeat every 7 years  Mammogram status: Completed 12/23/2021. Repeat every year  Bone Density status: Completed 11/12/2020.  Lung Cancer Screening: (Low Dose CT Chest recommended if Age 18-80 years, 30 pack-year currently smoking OR have quit w/in 15years.) does not qualify.   Lung Cancer Screening Referral: no  Additional Screening:  Hepatitis C Screening: does qualify; Completed 02/20/2020  Vision Screening: Recommended annual ophthalmology exams for early detection of glaucoma and other disorders of the eye. Is the patient up to date with their annual eye exam?  Yes  Who is the provider or what is the name of the office in which the patient attends annual eye exams? [facility]  If pt  is not established with a provider, would they like to be referred to a provider to establish care? No .   Dental Screening: Recommended annual dental exams for proper oral hygiene  Community Resource Referral / Chronic Care Management: CRR required this visit?  No   CCM required this visit?  No      Plan:     I have personally reviewed and noted the following in the patient's chart:   Medical and social history Use of alcohol, tobacco or illicit drugs  Current medications and supplements including opioid prescriptions. Patient is not currently taking opioid prescriptions. Functional ability and status Nutritional status Physical activity Advanced directives List of other physicians Hospitalizations, surgeries, and ER visits in previous 12 months Vitals Screenings to include cognitive, depression, and falls Referrals and appointments  In addition, I have reviewed and discussed with patient certain preventive protocols, quality metrics, and best practice recommendations. A written personalized care plan for preventive services as  well as general preventive health recommendations were provided to patient.     Barb Merino, LPN   10/09/9145   Nurse Notes: none  Due to this being a virtual visit, the after visit summary with patients personalized plan was offered to patient via mail or my-chart. Patient would like to access on my-chart

## 2022-08-10 NOTE — Patient Instructions (Signed)
Jamie Singh , Thank you for taking time to come for your Medicare Wellness Visit. I appreciate your ongoing commitment to your health goals. Please review the following plan we discussed and let me know if I can assist you in the future.   These are the goals we discussed:  Goals      Patient Stated     03/06/2021, wants to weigh 135 pounds     Patient Stated     08/10/2022, wants to build up stamina        This is a list of the screening recommended for you and due dates:  Health Maintenance  Topic Date Due   COVID-19 Vaccine (5 - 2023-24 season) 10/30/2021   Flu Shot  09/30/2022   Medicare Annual Wellness Visit  08/10/2023   Mammogram  12/24/2023   Colon Cancer Screening  12/31/2028   DTaP/Tdap/Td vaccine (2 - Td or Tdap) 06/03/2031   Pneumonia Vaccine  Completed   DEXA scan (bone density measurement)  Completed   Hepatitis C Screening  Completed   Zoster (Shingles) Vaccine  Completed   HPV Vaccine  Aged Out    Advanced directives: Please bring a copy of your POA (Power of [place]) and/or Living Will to your next appointment.   Conditions/risks identified: none  Next appointment: Follow up in one year for your annual wellness visit    Preventive Care 65 Years and Older, Female Preventive care refers to lifestyle choices and visits with your health care provider that can promote health and wellness. What does preventive care include? A yearly physical exam. This is also called an annual well check. Dental exams once or twice a year. Routine eye exams. Ask your health care provider how often you should have your eyes checked. Personal lifestyle choices, including: Daily care of your teeth and gums. Regular physical activity. Eating a healthy diet. Avoiding tobacco and drug use. Limiting alcohol use. Practicing safe sex. Taking low-dose aspirin every day. Taking vitamin and mineral supplements as recommended by your health care provider. What happens during an annual  well check? The services and screenings done by your health care provider during your annual well check will depend on your age, overall health, lifestyle risk factors, and family history of disease. Counseling  Your health care provider may ask you questions about your: Alcohol use. Tobacco use. Drug use. Emotional well-being. Home and relationship well-being. Sexual activity. Eating habits. History of falls. Memory and ability to understand (cognition). Work and work Astronomer. Reproductive health. Screening  You may have the following tests or measurements: Height, weight, and BMI. Blood pressure. Lipid and cholesterol levels. These may be checked every 5 years, or more frequently if you are over 26 years old. Skin check. Lung cancer screening. You may have this screening every year starting at age 57 if you have a 30-pack-year history of smoking and currently smoke or have quit within the past 15 years. Fecal occult blood test (FOBT) of the stool. You may have this test every year starting at age 31. Flexible sigmoidoscopy or colonoscopy. You may have a sigmoidoscopy every 5 years or a colonoscopy every 10 years starting at age 50. Hepatitis C blood test. Hepatitis B blood test. Sexually transmitted disease (STD) testing. Diabetes screening. This is done by checking your blood sugar (glucose) after you have not eaten for a while (fasting). You may have this done every 1-3 years. Bone density scan. This is done to screen for osteoporosis. You may have this done starting  at age 87. Mammogram. This may be done every 1-2 years. Talk to your health care provider about how often you should have regular mammograms. Talk with your health care provider about your test results, treatment options, and if necessary, the need for more tests. Vaccines  Your health care provider may recommend certain vaccines, such as: Influenza vaccine. This is recommended every year. Tetanus, diphtheria,  and acellular pertussis (Tdap, Td) vaccine. You may need a Td booster every 10 years. Zoster vaccine. You may need this after age 71. Pneumococcal 13-valent conjugate (PCV13) vaccine. One dose is recommended after age 2. Pneumococcal polysaccharide (PPSV23) vaccine. One dose is recommended after age 15. Talk to your health care provider about which screenings and vaccines you need and how often you need them. This information is not intended to replace advice given to you by your health care provider. Make sure you discuss any questions you have with your health care provider. Document Released: 03/14/2015 Document Revised: 11/05/2015 Document Reviewed: 12/17/2014 Elsevier Interactive Patient Education  2017 [place] Prevention in the Home Falls can cause injuries. They can happen to people of all ages. There are many things you can do to make your home safe and to help prevent falls. What can I do on the outside of my home? Regularly fix the edges of walkways and driveways and fix any cracks. Remove anything that might make you trip as you walk through a door, such as a raised step or threshold. Trim any bushes or trees on the path to your home. Use bright outdoor lighting. Clear any walking paths of anything that might make someone trip, such as rocks or tools. Regularly check to see if handrails are loose or broken. Make sure that both sides of any steps have handrails. Any raised decks and porches should have guardrails on the edges. Have any leaves, snow, or ice cleared regularly. Use sand or salt on walking paths during winter. Clean up any spills in your garage right away. This includes oil or grease spills. What can I do in the bathroom? Use night lights. Install grab bars by the toilet and in the tub and shower. Do not use towel bars as grab bars. Use non-skid mats or decals in the tub or shower. If you need to sit down in the shower, use a plastic, non-slip  stool. Keep the floor dry. Clean up any water that spills on the floor as soon as it happens. Remove soap buildup in the tub or shower regularly. Attach bath mats securely with double-sided non-slip rug tape. Do not have throw rugs and other things on the floor that can make you trip. What can I do in the bedroom? Use night lights. Make sure that you have a light by your bed that is easy to reach. Do not use any sheets or blankets that are too big for your bed. They should not hang down onto the floor. Have a firm chair that has side arms. You can use this for support while you get dressed. Do not have throw rugs and other things on the floor that can make you trip. What can I do in the kitchen? Clean up any spills right away. Avoid walking on wet floors. Keep items that you use a lot in easy-to-reach places. If you need to reach something above you, use a strong step stool that has a grab bar. Keep electrical cords out of the way. Do not use floor polish or wax that makes  floors slippery. If you must use wax, use non-skid floor wax. Do not have throw rugs and other things on the floor that can make you trip. What can I do with my stairs? Do not leave any items on the stairs. Make sure that there are handrails on both sides of the stairs and use them. Fix handrails that are broken or loose. Make sure that handrails are as long as the stairways. Check any carpeting to make sure that it is firmly attached to the stairs. Fix any carpet that is loose or worn. Avoid having throw rugs at the top or bottom of the stairs. If you do have throw rugs, attach them to the floor with carpet tape. Make sure that you have a light switch at the top of the stairs and the bottom of the stairs. If you do not have them, ask someone to add them for you. What else can I do to help prevent falls? Wear shoes that: Do not have high heels. Have rubber bottoms. Are comfortable and fit you well. Are closed at the  toe. Do not wear sandals. If you use a stepladder: Make sure that it is fully opened. Do not climb a closed stepladder. Make sure that both sides of the stepladder are locked into place. Ask someone to hold it for you, if possible. Clearly mark and make sure that you can see: Any grab bars or handrails. First and last steps. Where the edge of each step is. Use tools that help you move around (mobility aids) if they are needed. These include: Canes. Walkers. Scooters. Crutches. Turn on the lights when you go into a dark area. Replace any light bulbs as soon as they burn out. Set up your furniture so you have a clear path. Avoid moving your furniture around. If any of your floors are uneven, fix them. If there are any pets around you, be aware of where they are. Review your medicines with your doctor. Some medicines can make you feel dizzy. This can increase your chance of falling. Ask your doctor what other things that you can do to help prevent falls. This information is not intended to replace advice given to you by your health care provider. Make sure you discuss any questions you have with your health care provider. Document Released: 12/12/2008 Document Revised: 07/24/2015 Document Reviewed: 03/22/2014 Elsevier Interactive Patient Education  2017 [employer].

## 2022-08-20 ENCOUNTER — Other Ambulatory Visit: Payer: Self-pay | Admitting: Medical

## 2022-08-23 ENCOUNTER — Telehealth: Payer: Self-pay | Admitting: Surgery

## 2022-08-23 NOTE — Telephone Encounter (Signed)
Reached out to patient regarding mychart message she sent. Patient had requested "other" medication be sent in. LVM for pt to call our office back to clarify if this is the Tacrolimus our office has on hold for her or something else.

## 2022-08-24 ENCOUNTER — Telehealth: Payer: Self-pay | Admitting: *Deleted

## 2022-08-24 NOTE — Telephone Encounter (Signed)
Attempted to reach Jamie Singh in regards to her most recent symptoms and message for the office. Left a voicemail requesting call back at [phone].

## 2022-08-25 ENCOUNTER — Other Ambulatory Visit (HOSPITAL_COMMUNITY): Payer: Self-pay

## 2022-08-25 NOTE — Telephone Encounter (Signed)
Spoke with Ms. Batchelder and she states,"yes, the bumps are still present and they are itchy"  patient also states she stayed away from the swimming pool for 4 weeks and that didn't change the vulva tissue at all, so she decided to get back to her water exercising. Patient states when pressure is placed on the tissue internally it bleeds and is bright red. So currently she is not placing any pressure on the vulva tissue.  Patient states she will go to [facility] pharmacy this afternoon to pick up the Rx. For Tacrolimus 0.1 % ointment.  Patient advised that the office would call the pharmacy to take the Rx. Off hold and have it ready for her. Patient also instructed to let us know how the tacrolimus works for her. Pt states she would send a message through MyChart and give the office and update.  Pt thanked the office for calling and had no further concerns or questions at this time.

## 2022-08-31 ENCOUNTER — Other Ambulatory Visit: Payer: Self-pay | Admitting: Medical

## 2022-09-16 ENCOUNTER — Encounter: Payer: Self-pay | Admitting: Gynecologic Oncology

## 2022-09-17 ENCOUNTER — Telehealth: Payer: Self-pay

## 2022-09-17 NOTE — Telephone Encounter (Signed)
-----   Message from Doylene Bode sent at 09/17/2022  3:36 PM EDT ----- The patient is still having symptoms on the vulva with bumps etc. Please set her up for see Dr. Pricilla Holm for further evaluation. She may need to have a minor surgical procedure for removal but that we will see what dr. Pricilla Holm has to say. Thanks

## 2022-09-17 NOTE — Telephone Encounter (Signed)
Earliest appointment for Dr. Pricilla Holm is 8/2 @ 8:15, ok per [facility] APP. If there is a cancellation, pt can be moved up.   LVM for patient to call office to confirm date/time.

## 2022-09-20 NOTE — Telephone Encounter (Signed)
LVM for patient to call office regarding an follow up appointment with Dr.Tucker.

## 2022-09-21 NOTE — Telephone Encounter (Signed)
Pt is aware of follow up appointment with Dr. Pricilla Holm. She agrees to date/time.

## 2022-09-28 ENCOUNTER — Encounter: Payer: Self-pay | Admitting: Gynecologic Oncology

## 2022-09-28 ENCOUNTER — Telehealth: Payer: Self-pay

## 2022-09-28 NOTE — Telephone Encounter (Signed)
Spoke with patient and changed her appointment on 10/01/22 from 8:15am to 4:15pm.  Patient confirmed and understood.

## 2022-10-01 ENCOUNTER — Encounter: Payer: Self-pay | Admitting: Gynecologic Oncology

## 2022-10-01 ENCOUNTER — Other Ambulatory Visit: Payer: Self-pay | Admitting: Medical

## 2022-10-01 ENCOUNTER — Other Ambulatory Visit: Payer: Self-pay

## 2022-10-01 ENCOUNTER — Inpatient Hospital Stay: Payer: Medicare Other | Attending: Gynecologic Oncology | Admitting: Gynecologic Oncology

## 2022-10-01 VITALS — BP 118/61 | HR 65 | Temp 98.1°F | Resp 18 | Ht 59.0 in | Wt 139.8 lb

## 2022-10-01 DIAGNOSIS — Z90722 Acquired absence of ovaries, bilateral: Secondary | ICD-10-CM | POA: Insufficient documentation

## 2022-10-01 DIAGNOSIS — Z923 Personal history of irradiation: Secondary | ICD-10-CM | POA: Diagnosis not present

## 2022-10-01 DIAGNOSIS — N894 Leukoplakia of vagina: Secondary | ICD-10-CM | POA: Diagnosis not present

## 2022-10-01 DIAGNOSIS — Z8542 Personal history of malignant neoplasm of other parts of uterus: Secondary | ICD-10-CM | POA: Diagnosis present

## 2022-10-01 DIAGNOSIS — N763 Subacute and chronic vulvitis: Secondary | ICD-10-CM | POA: Insufficient documentation

## 2022-10-01 DIAGNOSIS — C541 Malignant neoplasm of endometrium: Secondary | ICD-10-CM

## 2022-10-01 DIAGNOSIS — Z08 Encounter for follow-up examination after completed treatment for malignant neoplasm: Secondary | ICD-10-CM | POA: Insufficient documentation

## 2022-10-01 DIAGNOSIS — Z90710 Acquired absence of both cervix and uterus: Secondary | ICD-10-CM | POA: Insufficient documentation

## 2022-10-01 MED ORDER — GABAPENTIN 100 MG PO CAPS
100.0000 mg | ORAL_CAPSULE | Freq: Every day | ORAL | 2 refills | Status: DC
Start: 2022-10-01 — End: 2022-11-05

## 2022-10-01 NOTE — Telephone Encounter (Signed)
Last apt 05/14/22

## 2022-10-01 NOTE — Progress Notes (Signed)
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  On 08/18/2018 she underwent robotic assisted total laparoscopic hysterectomy, bilateral salpingo-oophorectomy, bilateral sentinel lymph node dissection, and collection of pelvic washings.  Pathology revealed a tumor size of 4.5 cm, myometrial invasion 42% (8 of 19 mm), no LVSI.  Was performed at [facility] with Dr. Isabel Caprice.  Patient then received adjuvant vaginal brachytherapy (vaginal cuff HDR, 4 fractions of 5.5 Gray, treated from 8/11-8/21/2020).   The patient was last seen for surveillance visit in [place] with Dr. Modena Jansky on 11/2019.  She then transferred her care to [facility].  More recent history: -Seen on 04/23/22 for vulvar itching/bumps/bleeding/irritation: biopsy taken and returned as hyperkeratosis and parakeratosis. Started on vaginal estrogen to the area. Follow up call with pt stating things were improving.   -Call about persistent symptoms end of April 2024: clobetasol prescribed to be alternated with vaginal estrogen. Pt felt cream was helping on follow up call   -Seen 08/03/22 due to persistent symptoms: Vulvar area re-biopsied (returned as hyperkeratosis and reactive changes). Swabs taken (culture, wet prep, HSV given hx-negative). Discussed with [facility]. Tacrolimus ordered. Patient did not start this right away because she was traveling.  Follow up call included patient reporting avoiding the pool for 4 weeks with no change in her symptoms.   -End of June 2024: pt started the tacrolimus. Mid July 2024 reporting no change in her symptoms. Follow up appt made.   Interval History: Patient reports continued symptoms which include pruritus and irritation.  Most recently, she has had some relief from use of Vaseline.  Has a  difficult time stopping herself from itching the areas on her vulva and outer vagina.  Denies any bleeding except rare times after she scratches a lot.  Denies any discharge.  Past Medical/Surgical History: Past Medical History:  Diagnosis Date   ADHD    Allergy    Anxiety and depression    Aortic aneurysm (HCC)    Asthma    mild, but prior on preventative FLovent   Endometrial cancer (HCC) 2020   Genital herpes    GERD (gastroesophageal reflux disease)    Hyperlipidemia    Hypothyroidism    Osteoporosis    Thoracic aortic aneurysm (HCC)    Wears hearing aid     Past Surgical History:  Procedure Laterality Date   COLONOSCOPY  2019   ESOPHAGOGASTRODUODENOSCOPY ENDOSCOPY     ROBOTIC ASSISTED TOTAL HYSTERECTOMY WITH BILATERAL SALPINGO OOPHERECTOMY     RA-TLH/BSO   TONSILLECTOMY AND ADENOIDECTOMY     WRIST FRACTURE SURGERY Right     Family History  Problem Relation Age of Onset   COPD Mother    Heart failure Mother    Macular degeneration Mother    Osteoporosis Mother    Cancer Mother        Mom needed a hysterectomy, thinks that she may have had uterine cancer   Heart disease Father        MI   COPD Father    Aortic aneurysm Father    Heart disease Brother        MI   Myelodysplastic syndrome Brother    Osteoporosis Maternal Grandmother    Heart failure Maternal Grandmother    Heart failure Maternal Grandfather    COPD Maternal Grandfather    Stroke Paternal Grandfather  Breast cancer Neg Hx    Colon cancer Neg Hx    Colon polyps Neg Hx    Esophageal cancer Neg Hx    Rectal cancer Neg Hx    Stomach cancer Neg Hx     Social History   Socioeconomic History   Marital status: Married    Spouse name: Not on file   Number of children: Not on file   Years of education: Not on file   Highest education level: Bachelor's degree (e.g., BA, AB, BS)  Occupational History   Occupation: Hydrographic surveyor - retired  Tobacco Use   Smoking status: Never   Smokeless  tobacco: Never  Vaping Use   Vaping status: Never Used  Substance and Sexual Activity   Alcohol use: Not Currently    Alcohol/week: 2.0 - 3.0 standard drinks of alcohol    Types: 2 - 3 Glasses of wine per week    Comment: 2-4 times monthly   Drug use: Yes    Types: Marijuana    Comment: gummies   Sexual activity: Not Currently  Other Topics Concern   Not on file  Social History Narrative   Lives with husband and dog.  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   Social Determinants of Health   Financial Resource Strain: Low Risk  (08/07/2022)   Overall Financial Resource Strain (CARDIA)    Difficulty of Paying Living Expenses: Not hard at all  Food Insecurity: No Food Insecurity (08/07/2022)   Hunger Vital Sign    Worried About Running Out of Food in the Last Year: Never true    Ran Out of Food in the Last Year: Never true  Transportation Needs: No Transportation Needs (08/07/2022)   PRAPARE - Administrator, Civil Service (Medical): No    Lack of Transportation (Non-Medical): No  Physical Activity: Sufficiently Active (08/07/2022)   Exercise Vital Sign    Days of Exercise per Week: 4 days    Minutes of Exercise per Session: 60 min  Stress: No Stress Concern Present (08/07/2022)   [employer] of Occupational Health - Occupational Stress Questionnaire    Feeling of Stress : Only a little  Social Connections: Unknown (08/07/2022)   Social Connection and Isolation Panel [NHANES]    Frequency of Communication with Friends and Family: More than three times a week    Frequency of Social Gatherings with Friends and Family: Three times a week    Attends Religious Services: Not on file    Active Member of Clubs or Organizations: No    Attends Banker Meetings: Never    Marital Status: Married    Current Medications:  Current Outpatient Medications:    albuterol (VENTOLIN HFA) 108 (90 Base) MCG/ACT inhaler, Inhale 2 puffs into the lungs every 6 (six) hours as  needed for wheezing or shortness of breath., Disp: 3 each, Rfl: 0   amphetamine-dextroamphetamine (ADDERALL) 10 MG tablet, Take 1 tablet (10 mg total) by mouth daily with breakfast., Disp: 90 tablet, Rfl: 0   buPROPion (WELLBUTRIN) 75 MG tablet, TAKE 1 TABLET DAILY, Disp: 90 tablet, Rfl: 0   Calcium-Magnesium 250-125 MG TABS, Take 2 tablets by mouth daily., Disp: , Rfl:    cholecalciferol (VITAMIN D) 25 MCG (1000 UNIT) tablet, TAKE 1 TABLET EVERY DAY, Disp: 90 tablet, Rfl: 0   estradiol (ESTRACE VAGINAL) 0.1 MG/GM vaginal cream, Use small fingertip size amount of cream at the entrance of the vagina at bedtime three times a week for the next two weeks,  Disp: 42.5 g, Rfl: 0   famotidine (PEPCID) 20 MG tablet, TAKE 1 TABLET AT BEDTIME, Disp: 90 tablet, Rfl: 1   gabapentin (NEURONTIN) 100 MG capsule, Take 1 capsule (100 mg total) by mouth at bedtime., Disp: 30 capsule, Rfl: 2   levothyroxine (SYNTHROID) 100 MCG tablet, TAKE 1 TABLET DAILY, Disp: 90 tablet, Rfl: 1   PSYLLIUM HUSK PO, Take 2,000 mg by mouth daily., Disp: , Rfl:    rosuvastatin (CRESTOR) 10 MG tablet, TAKE 1 TABLET DAILY, Disp: 90 tablet, Rfl: 1   valACYclovir (VALTREX) 500 MG tablet, Take 1 tablet (500 mg total) by mouth daily., Disp: 90 tablet, Rfl: 3   escitalopram (LEXAPRO) 10 MG tablet, TAKE 1 TABLET DAILY, Disp: 90 tablet, Rfl: 0  Review of Systems: + Pain with urination, itching Denies appetite changes, fevers, chills, fatigue, unexplained weight changes. Denies hearing loss, neck lumps or masses, mouth sores, ringing in ears or voice changes. Denies cough or wheezing.  Denies shortness of breath. Denies chest pain or palpitations. Denies leg swelling. Denies abdominal distention, pain, blood in stools, constipation, diarrhea, nausea, vomiting, or early satiety. Denies pain with intercourse, frequency, hematuria or incontinence. Denies hot flashes, pelvic pain, vaginal bleeding or vaginal discharge.   Denies joint pain, back  pain or muscle pain/cramps. Denies rash, or wounds. Denies dizziness, headaches, numbness or seizures. Denies swollen lymph nodes or glands, denies easy bruising or bleeding. Denies anxiety, depression, confusion, or decreased concentration.  Physical Exam: BP 118/61 (BP Location: Left Arm, Patient Position: Sitting)   Pulse 65   Temp 98.1 F (36.7 C) (Tympanic)   Resp 18   Ht 4\' 11"  (1.499 m)   Wt 139 lb 12.8 oz (63.4 kg)   SpO2 99%   BMI 28.24 kg/m  General: Alert, oriented, no acute distress. HEENT: Normocephalic, atraumatic, sclera anicteric.  GU: External vulva is atrophic.  Along the posterior aspect of the vagina just inside the introitus there is an approximately 2 x 2 and 1/2 cm area of hyperkeratosis with slightly raised white patch of skin.  There are a couple of similar smaller lesions along the right introitus between 9 and 11:00.  No other distal vaginal or vulvar lesions noted.  Laboratory & Radiologic Studies: None new  Assessment & Plan: TIMMYA BLAZIER is a 72 y.o. 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  Patient received adjuvant vaginal brachytherapy.  She has been NED since bleeding treatment in 09/2018. Now with approximately 6 months of vaginal/vulvar symptoms.  Reviewed treatment history with the patient and Melissa.  Patient has tried multiple topical therapies including estrogen cream, steroid cream, and tacrolimus.  None of these have provided much relief.  Multiple biopsies have been taken showing hyperkeratosis and reactive changes, no dysplasia or malignancy.  HSV, wet prep, and vaginal culture all negative.  Discussed possible treatment options in the setting of hyperkeratosis.  I spoke with the dermatologist about other possible treatments.  She suggested an anti-itch regimen as some of the patient's cycle seems to be related to chronic and aggressive scratching.  I asked  her to start twice daily Zyrtec as well as gabapentin at night to hopefully help her prevent itching.  We also discussed possible topical NSAID, amitriptyline/ketamine low-dose topical cream, low dose oral steroid, or consideration of laser therapy.  I recommended that she reach out to her dermatologist to see if she can have a virtual visit or be seen  for an in person visit to get their guidance on next steps with regard to treatment.  38 minutes of total time was spent for this patient encounter, including preparation, face-to-face counseling with the patient and coordination of care, and documentation of the encounter.  Eugene Garnet, MD  Division of Gynecologic Oncology  Department of Obstetrics and Gynecology  [facility]

## 2022-10-01 NOTE — Patient Instructions (Addendum)
Keep using the vaseline. Plan on using estrace vaginal cream at the entrance at nighttime three times a week.   Please start zyrtec 10 mg twice a day (or similar medication). I'm sending in a prescription for gabapentin. Please take one pill at night before bed. This medication can be sedating, so try it first at night before you take it during day time hours.   She would like for you to see your dermatologist for further recommendations/evaluation.   Biopsy from February A. VAGINA, BIOPSY:  - Vaginal mucosa with hyperkeratosis and parakeratosis, see comment  - Negative for dysplasia or malignancy   Biopsy from June A. RIGHT VULVA AT INTROITUS, BIOPSY:  - Squamous mucosa with reactive changes  - Negative for dysplasia or malignancy  B. LEFT VULVA AT INTROITUS, BIOPSY:  - Squamous mucosa with hyperkeratosis and reactive changes  - Negative for dysplasia or malignancy

## 2022-10-08 ENCOUNTER — Telehealth: Payer: Self-pay | Admitting: *Deleted

## 2022-10-08 NOTE — Telephone Encounter (Signed)
Spoke with Jamie Singh who returned call. Pt states she has a call out to a dermatologist and is still waiting to hear back,  "I've been out of town for the last 5 days"  and "I have used gabapentin for the first time last night and I don't have any pain currently, so I think I'm doing well"  Patient thanked the office for calling and had no concerns at this time.

## 2022-10-08 NOTE — Telephone Encounter (Signed)
-----   Message from Carver Fila sent at 10/08/2022  6:53 AM EDT ----- Would someone check in with her about symptoms. Has she set up an appt with derm? Thanks!

## 2022-10-08 NOTE — Telephone Encounter (Signed)
Attempted to reach patient this morning, left voicemail requesting call back to [phone].

## 2022-10-21 ENCOUNTER — Other Ambulatory Visit: Payer: Self-pay | Admitting: Medical

## 2022-10-21 MED ORDER — AMPHETAMINE-DEXTROAMPHETAMINE 10 MG PO TABS: 10.0000 mg | ORAL_TABLET | Freq: Every day | ORAL | 0 refills | Status: DC

## 2022-11-05 ENCOUNTER — Ambulatory Visit (INDEPENDENT_AMBULATORY_CARE_PROVIDER_SITE_OTHER): Payer: Medicare Other | Admitting: Medical

## 2022-11-05 ENCOUNTER — Encounter: Payer: Self-pay | Admitting: Medical

## 2022-11-05 VITALS — BP 110/70 | HR 78 | Ht 59.5 in | Wt 140.4 lb

## 2022-11-05 DIAGNOSIS — Z7189 Other specified counseling: Secondary | ICD-10-CM

## 2022-11-05 DIAGNOSIS — Z5181 Encounter for therapeutic drug level monitoring: Secondary | ICD-10-CM

## 2022-11-05 DIAGNOSIS — L989 Disorder of the skin and subcutaneous tissue, unspecified: Secondary | ICD-10-CM

## 2022-11-05 DIAGNOSIS — Z780 Asymptomatic menopausal state: Secondary | ICD-10-CM

## 2022-11-05 DIAGNOSIS — Z974 Presence of external hearing-aid: Secondary | ICD-10-CM

## 2022-11-05 DIAGNOSIS — M8580 Other specified disorders of bone density and structure, unspecified site: Secondary | ICD-10-CM

## 2022-11-05 DIAGNOSIS — E039 Hypothyroidism, unspecified: Secondary | ICD-10-CM

## 2022-11-05 DIAGNOSIS — F419 Anxiety disorder, unspecified: Secondary | ICD-10-CM

## 2022-11-05 DIAGNOSIS — K219 Gastro-esophageal reflux disease without esophagitis: Secondary | ICD-10-CM

## 2022-11-05 DIAGNOSIS — J4520 Mild intermittent asthma, uncomplicated: Secondary | ICD-10-CM

## 2022-11-05 DIAGNOSIS — Z8542 Personal history of malignant neoplasm of other parts of uterus: Secondary | ICD-10-CM

## 2022-11-05 DIAGNOSIS — I712 Thoracic aortic aneurysm, without rupture, unspecified: Secondary | ICD-10-CM

## 2022-11-05 DIAGNOSIS — Z7185 Encounter for immunization safety counseling: Secondary | ICD-10-CM

## 2022-11-05 DIAGNOSIS — Z23 Encounter for immunization: Secondary | ICD-10-CM

## 2022-11-05 DIAGNOSIS — F909 Attention-deficit hyperactivity disorder, unspecified type: Secondary | ICD-10-CM

## 2022-11-05 DIAGNOSIS — Z90710 Acquired absence of both cervix and uterus: Secondary | ICD-10-CM

## 2022-11-05 DIAGNOSIS — E559 Vitamin D deficiency, unspecified: Secondary | ICD-10-CM

## 2022-11-05 DIAGNOSIS — Z0000 Encounter for general adult medical examination without abnormal findings: Secondary | ICD-10-CM

## 2022-11-05 DIAGNOSIS — Z131 Encounter for screening for diabetes mellitus: Secondary | ICD-10-CM

## 2022-11-05 DIAGNOSIS — E2839 Other primary ovarian failure: Secondary | ICD-10-CM | POA: Diagnosis not present

## 2022-11-05 DIAGNOSIS — D369 Benign neoplasm, unspecified site: Secondary | ICD-10-CM | POA: Insufficient documentation

## 2022-11-05 DIAGNOSIS — E7800 Pure hypercholesterolemia, unspecified: Secondary | ICD-10-CM

## 2022-11-05 LAB — POCT URINALYSIS DIP (PROADVANTAGE DEVICE)
Bilirubin, UA: NEGATIVE
Blood, UA: NEGATIVE
Glucose, UA: NEGATIVE mg/dL
Ketones, POC UA: NEGATIVE mg/dL
Leukocytes, UA: NEGATIVE
Nitrite, UA: NEGATIVE
Protein Ur, POC: NEGATIVE mg/dL
Specific Gravity, Urine: 1.005
Urobilinogen, Ur: NEGATIVE
pH, UA: 7 (ref 5.0–8.0)

## 2022-11-05 MED ORDER — FLUTICASONE FUROATE-VILANTEROL 100-25 MCG/ACT IN AEPB
1.0000 | INHALATION_SPRAY | Freq: Every day | RESPIRATORY_TRACT | 11 refills | Status: DC
Start: 2022-11-05 — End: 2022-12-23

## 2022-11-05 NOTE — Progress Notes (Signed)
Subjective:   HPI  Jamie Singh is a 72 y.o. female who presents for Chief Complaint  Patient presents with   Medical Management of Chronic Issues    Med check, AWV in June 2024    Medical team: Dr. Eugene Garnet, gynecology oncology Dr. Elroy Channel and Dr. Gala Murdoch, cardiothoracic surgery Dr. Jodi Geralds, [facility] Sees eye doctor Sees dentist [facility] dermatology Lyanna Blystone, Kermit Balo, PA-C here for primary care      Concerns: She notes some morning phlegm.  She wonders if she needs to go back on her preventative inhaler.  She has been on this before.  She does not use the albuterol that frequently though.  She has been seeing dermatology for a vulvar lesion that has been difficult to treat.  It is finally getting some improvement on the recent creams prescribed by dermatology  She is doing fine on her current medicine for ADD and mood.  Compliant with thyroid medicine without complaint.  Compliant with lipid medicine without complaint although her cardiologist says she probably needs to be on higher dose.     Reviewed their medical, surgical, family, social, medication, and allergy history and updated chart as appropriate.  Allergies  Allergen Reactions   Pollen Extract Hives   Quinolones Other (See Comments)    Fluroquinolone antibiotics should be avoided in patients with aortic disease unless alternative therapy is not an option.    Past Medical History:  Diagnosis Date   ADHD    Allergy    Anxiety and depression    Aortic aneurysm (HCC)    Asthma    mild, but prior on preventative FLovent   Endometrial cancer (HCC) 2020   Genital herpes    GERD (gastroesophageal reflux disease)    Hyperlipidemia    Hypothyroidism    Osteoporosis    Thoracic aortic aneurysm (HCC)    Wears hearing aid     Current Outpatient Medications on File Prior to Visit  Medication Sig Dispense Refill   albuterol (VENTOLIN HFA) 108 (90 Base) MCG/ACT inhaler  Inhale 2 puffs into the lungs every 6 (six) hours as needed for wheezing or shortness of breath. 3 each 0   amphetamine-dextroamphetamine (ADDERALL) 10 MG tablet Take 1 tablet (10 mg total) by mouth daily with breakfast. 90 tablet 0   buPROPion (WELLBUTRIN) 75 MG tablet TAKE 1 TABLET DAILY 90 tablet 0   Calcium-Magnesium 250-125 MG TABS Take 2 tablets by mouth daily.     cholecalciferol (VITAMIN D) 25 MCG (1000 UNIT) tablet TAKE 1 TABLET EVERY DAY 90 tablet 0   escitalopram (LEXAPRO) 10 MG tablet TAKE 1 TABLET DAILY 90 tablet 0   estradiol (ESTRACE VAGINAL) 0.1 MG/GM vaginal cream Use small fingertip size amount of cream at the entrance of the vagina at bedtime three times a week for the next two weeks 42.5 g 0   famotidine (PEPCID) 20 MG tablet TAKE 1 TABLET AT BEDTIME 90 tablet 1   fluocinonide ointment (LIDEX) 0.05 %      levothyroxine (SYNTHROID) 100 MCG tablet TAKE 1 TABLET DAILY 90 tablet 1   PSYLLIUM HUSK PO Take 2,000 mg by mouth daily.     rosuvastatin (CRESTOR) 10 MG tablet TAKE 1 TABLET DAILY 90 tablet 1   SSD 1 % cream      valACYclovir (VALTREX) 500 MG tablet Take 1 tablet (500 mg total) by mouth daily. 90 tablet 3   No current facility-administered medications on file prior to visit.  Current Outpatient Medications:    albuterol (VENTOLIN HFA) 108 (90 Base) MCG/ACT inhaler, Inhale 2 puffs into the lungs every 6 (six) hours as needed for wheezing or shortness of breath., Disp: 3 each, Rfl: 0   amphetamine-dextroamphetamine (ADDERALL) 10 MG tablet, Take 1 tablet (10 mg total) by mouth daily with breakfast., Disp: 90 tablet, Rfl: 0   buPROPion (WELLBUTRIN) 75 MG tablet, TAKE 1 TABLET DAILY, Disp: 90 tablet, Rfl: 0   Calcium-Magnesium 250-125 MG TABS, Take 2 tablets by mouth daily., Disp: , Rfl:    cholecalciferol (VITAMIN D) 25 MCG (1000 UNIT) tablet, TAKE 1 TABLET EVERY DAY, Disp: 90 tablet, Rfl: 0   escitalopram (LEXAPRO) 10 MG tablet, TAKE 1 TABLET DAILY, Disp: 90 tablet,  Rfl: 0   estradiol (ESTRACE VAGINAL) 0.1 MG/GM vaginal cream, Use small fingertip size amount of cream at the entrance of the vagina at bedtime three times a week for the next two weeks, Disp: 42.5 g, Rfl: 0   famotidine (PEPCID) 20 MG tablet, TAKE 1 TABLET AT BEDTIME, Disp: 90 tablet, Rfl: 1   fluocinonide ointment (LIDEX) 0.05 %, , Disp: , Rfl:    fluticasone furoate-vilanterol (BREO ELLIPTA) 100-25 MCG/ACT AEPB, Inhale 1 puff into the lungs daily., Disp: 1 each, Rfl: 11   levothyroxine (SYNTHROID) 100 MCG tablet, TAKE 1 TABLET DAILY, Disp: 90 tablet, Rfl: 1   PSYLLIUM HUSK PO, Take 2,000 mg by mouth daily., Disp: , Rfl:    rosuvastatin (CRESTOR) 10 MG tablet, TAKE 1 TABLET DAILY, Disp: 90 tablet, Rfl: 1   SSD 1 % cream, , Disp: , Rfl:    valACYclovir (VALTREX) 500 MG tablet, Take 1 tablet (500 mg total) by mouth daily., Disp: 90 tablet, Rfl: 3  Family History  Problem Relation Age of Onset   COPD Mother    Heart failure Mother    Macular degeneration Mother    Osteoporosis Mother    Cancer Mother        Mom needed a hysterectomy, thinks that she may have had uterine cancer   Heart disease Father        MI   COPD Father    Aortic aneurysm Father    Heart disease Brother        MI   Myelodysplastic syndrome Brother    Osteoporosis Maternal Grandmother    Heart failure Maternal Grandmother    Heart failure Maternal Grandfather    COPD Maternal Grandfather    Stroke Paternal Grandfather    Breast cancer Neg Hx    Colon cancer Neg Hx    Colon polyps Neg Hx    Esophageal cancer Neg Hx    Rectal cancer Neg Hx    Stomach cancer Neg Hx     Past Surgical History:  Procedure Laterality Date   ABDOMINAL HYSTERECTOMY     COLONOSCOPY  2019   ESOPHAGOGASTRODUODENOSCOPY ENDOSCOPY     ROBOTIC ASSISTED TOTAL HYSTERECTOMY WITH BILATERAL SALPINGO OOPHERECTOMY     RA-TLH/BSO   TONSILLECTOMY AND ADENOIDECTOMY     WRIST FRACTURE SURGERY Right     Review of Systems  Constitutional:   Negative for chills, fever, malaise/fatigue and weight loss.  HENT:  Negative for congestion, ear pain, hearing loss, sore throat and tinnitus.   Eyes:  Negative for blurred vision, pain and redness.  Respiratory:  Positive for sputum production. Negative for cough, hemoptysis and shortness of breath.   Cardiovascular:  Negative for chest pain, palpitations, orthopnea, claudication and leg swelling.  Gastrointestinal:  Negative for  abdominal pain, blood in stool, constipation, diarrhea, nausea and vomiting.  Genitourinary:  Negative for dysuria, flank pain, frequency, hematuria and urgency.  Musculoskeletal:  Negative for falls, joint pain and myalgias.  Skin:  Negative for itching and rash.       Been dealing with recent skin vulvar lesions, seeing derm  Neurological:  Negative for dizziness, tingling, speech change, weakness and headaches.  Endo/Heme/Allergies:  Negative for polydipsia. Does not bruise/bleed easily.  Psychiatric/Behavioral:  Negative for depression and memory loss. The patient is not nervous/anxious and does not have insomnia.        Objective:  BP 110/70   Pulse 78   Ht 4' 11.5" (1.511 m)   Wt 140 lb 6.4 oz (63.7 kg)   BMI 27.88 kg/m   General appearance: alert, no distress, WD/WN, Caucasian female Skin: right lateral neck with 3-39mm diameter somewhat pearly raised pink/red lesion, possible BCC HEENT: hearing aids present, normocephalic, conjunctiva/corneas normal, sclerae anicteric, PERRLA, EOMi, nares patent, no discharge or erythema, pharynx normal Oral cavity: MMM, tongue normal, teeth normal Neck: supple, no lymphadenopathy, no thyromegaly, no masses, normal ROM, no bruits Chest: non tender, normal shape and expansion Heart: RRR, normal S1, S2, no murmurs Lungs: CTA bilaterally, no wheezes, rhonchi, or rales Abdomen: +bs, soft, non tender, non distended, no masses, no hepatomegaly, no splenomegaly, no bruits Back: non tender, normal ROM, no  scoliosis Musculoskeletal: upper extremities non tender, no obvious deformity, normal ROM throughout, lower extremities non tender, no obvious deformity, normal ROM throughout Extremities: no edema, no cyanosis, no clubbing Pulses: 2+ symmetric, upper and lower extremities, normal cap refill Neurological: alert, oriented x 3, CN2-12 intact, strength normal upper extremities and lower extremities, sensation normal throughout, DTRs 2+ throughout, no cerebellar signs, gait normal Psychiatric: normal affect, behavior normal, pleasant  Breast/gyn/rectal - deferred to gynecology     Assessment and Plan :   Encounter Diagnoses  Name Primary?   Medication monitoring encounter Yes   Anxiety and depression    Attention deficit hyperactivity disorder (ADHD), unspecified ADHD type    Estrogen deficiency    Gastroesophageal reflux disease, unspecified whether esophagitis present    Acquired hypothyroidism    Mild intermittent asthma without complication    Vitamin D deficiency, unspecified     Vaccine counseling    Uses hearing aid    Thoracic aortic aneurysm without rupture, unspecified part (HCC)    S/P hysterectomy    Pure hypercholesterolemia    Post-menopausal    History of endometrial cancer    Osteopenia, unspecified location    Tubular adenoma    Needs flu shot    Screening for diabetes mellitus    Postmenopausal estrogen deficiency    Skin lesion    Advanced directives, counseling/discussion      This visit was a preventative care visit, also known as wellness visit or routine physical.   Topics typically include healthy lifestyle, diet, exercise, preventative care, vaccinations, sick and well care, proper use of emergency dept and after hours care, as well as other concerns.    Separate significant issues discussed: Skin lesion - new lesion right neck.  She will see dermatology soon for followup on her vulvar lesion and this new lesion  Anxiety and depression-continue on  Lexapro 10 mg daily and Wellbutrin 75 mg daily  ADHD-doing fine on current therapy and Adderall 10 mg daily  Hypothyroidism-continue levothyroxine 100 mcg daily  Hyperlipidemia-continue rosuvastatin 10 mg daily  Vitamin D deficiency-continue vitamin D 1000 units daily  Intermittent  asthma-continue albuterol inhaler rescue inhaler as needed.  PFT reviewed.  Given morning phlegm, begin Breo daily preventative inhaler.  Discussed other options such as Singulair, allergy pill and other.  Uses hearing aid  Osteopenia-continue to get calcium 1200 mg daily through diet and/or supplement, continue vitamin D supplement, order placed to do an updated bone density test  Thoracic aortic aneurysm-I reviewed her 2023 echocardiogram and her recent 05/2022 cardiac MRI/MRA.  History of endometrial cancer-she continues with surveillance with gynecology  GERD-she continues on famotidine Pepcid 20 mg daily    General Recommendations: Continue to return yearly for your annual wellness and preventative care visits.  This gives [place] a chance to discuss healthy lifestyle, exercise, vaccinations, review your chart record, and perform screenings where appropriate.  I recommend you see your eye doctor yearly for routine vision care.  I recommend you see your dentist yearly for routine dental care including hygiene visits twice yearly.   Vaccination recommendations were reviewed Immunization History  Administered Date(s) Administered   Fluad Trivalent(High Dose 65+) 11/05/2022   Influenza, High Dose Seasonal PF 11/28/2019, 11/26/2020   Influenza-Unspecified 12/21/2016, 12/01/2018, 10/30/2021   PFIZER(Purple Top)SARS-COV-2 Vaccination 03/24/2019, 04/16/2019, 12/12/2019   PNEUMOCOCCAL CONJUGATE-20 03/06/2021   Pfizer Covid-19 Vaccine Bivalent Booster 50yrs & up 11/26/2020   Pneumococcal Conjugate-13 02/06/2018   Tdap 06/02/2021   Zoster Recombinant(Shingrix) 12/01/2018, 05/08/2019    Vaccine  recommendations: Counseled on the influenza virus vaccine.  Vaccine information sheet given.  Influenza vaccine given after consent obtained.    Screening for cancer: Colon cancer screening: Prior or last colon cancer screen: I reviewed her  01/01/22 colonoscopy from Dr. Particia Lather, Tubular adenoma noted, repeat in 5 years   Breast cancer screening: You should perform a self breast exam monthly.   Mammo up to date 11/2021   Skin cancer screening: Check your skin regularly for new changes, growing lesions, or other lesions of concern Come in for evaluation if you have skin lesions of concern.  Lung cancer screening: If you have a greater than 20 pack year history of tobacco use, then you may qualify for lung cancer screening with a chest CT scan.   Please call your insurance company to inquire about coverage for this test.  Pancreatic cancer: no current screening test is available routinely recommended.  (Risk factors: Smoking, overweight or obese, diabetes, chronic pancreatitis, work Nurse, mental health, Solicitor, 44 year old or greater, female greater than female, African-American, family history of pancreatic cancer, hereditary breast, ovarian, melanoma, Lynch, Peutz-jeghers).  We currently don't have screenings for other cancers besides breast, cervical, colon, and lung cancers.  If you have a strong family history of cancer or have other cancer screening concerns, please let me know.    Bone health: Get at least 150 minutes of aerobic exercise weekly Get weight bearing exercise at least once weekly Bone density test:  A bone density test is an imaging test that uses a type of X-ray to measure the amount of calcium and other minerals in your bones. The test may be used to diagnose or screen you for a condition that causes weak or thin bones (osteoporosis), predict your risk for a broken bone (fracture), or determine how well your osteoporosis treatment is working. The  bone density test is recommended for females 65 and older, or females or males <65 if certain risk factors such as thyroid disease, long term use of steroids such as for asthma or rheumatological issues, vitamin D deficiency, estrogen deficiency, family history of osteoporosis, self or  family history of fragility fracture in first degree relative.  Please call to schedule your bone density test.   [facility]  [phone] [address], [address] [place], [place] [address]    Heart health: Get at least 150 minutes of aerobic exercise weekly Limit alcohol It is important to maintain a healthy blood pressure and healthy cholesterol numbers  Heart disease screening: Screening for heart disease includes screening for blood pressure, fasting lipids, glucose/diabetes screening, BMI height to weight ratio, reviewed of smoking status, physical activity, and diet.    Goals include blood pressure 120/80 or less, maintaining a healthy lipid/cholesterol profile, preventing diabetes or keeping diabetes numbers under good control, not smoking or using tobacco products, exercising most days per week or at least 150 minutes per week of exercise, and eating healthy variety of fruits and vegetables, healthy oils, and avoiding unhealthy food choices like fried food, fast food, high sugar and high cholesterol foods.    I reviwed your 2024 cardiac MRI/MRA done at [facility], stable.   Medical care options: I recommend you continue to seek care here first for routine care.  We try really hard to have available appointments Monday through Friday daytime hours for sick visits, acute visits, and physicals.  Urgent care should be used for after hours and weekends for significant issues that cannot wait till the next day.  The emergency department should be used for significant potentially life-threatening emergencies.  The emergency department is expensive, can often have long wait times for  less significant concerns, so try to utilize primary care, urgent care, or telemedicine when possible to avoid unnecessary trips to the emergency department.  Virtual visits and telemedicine have been introduced since the pandemic started in 2020, and can be convenient ways to receive medical care.  We offer virtual appointments as well to assist you in a variety of options to seek medical care.   Legal Take the time to do a Last Will and Testament, advanced directives including Healthcare Power of Attorney and Living Will documents.  Do not leave your family with burdens that can be handled ahead of time.   Advanced Directives: I recommend you consider completing a Health Care Power of Attorney and Living Will.   These documents respect your wishes and help alleviate burdens on your loved ones if you were to become terminally ill or be in a position to need those documents enforced.    You can complete Advanced Directives yourself, have them notarized, then have copies made for our office, for you and for anybody you feel should have them in safe keeping.  Or, you can have an attorney prepare these documents.   If you haven't updated your Last Will and Testament in a while, it may be worthwhile having an attorney prepare these documents together and save on some costs.       Raela was seen today for medical management of chronic issues.  Diagnoses and all orders for this visit:  Medication monitoring encounter -     Comprehensive metabolic panel -     CBC -     POCT Urinalysis DIP (Proadvantage Device)  Anxiety and depression  Attention deficit hyperactivity disorder (ADHD), unspecified ADHD type  Estrogen deficiency  Gastroesophageal reflux disease, unspecified whether esophagitis present  Acquired hypothyroidism -     TSH + free T4  Mild intermittent asthma without complication -     Spirometry with Graph  Vitamin D deficiency, unspecified  -  VITAMIN D 25 Hydroxy  (Vit-D Deficiency, Fractures)  Vaccine counseling  Uses hearing aid  Thoracic aortic aneurysm without rupture, unspecified part (HCC)  S/P hysterectomy  Pure hypercholesterolemia -     Comprehensive metabolic panel -     Lipid panel  Post-menopausal  History of endometrial cancer  Osteopenia, unspecified location -     DG Bone Density; Future  Tubular adenoma  Needs flu shot -     Flu Vaccine Trivalent High Dose (Fluad)  Screening for diabetes mellitus -     Hemoglobin A1c  Postmenopausal estrogen deficiency -     DG Bone Density; Future  Skin lesion  Advanced directives, counseling/discussion  Other orders -     fluticasone furoate-vilanterol (BREO ELLIPTA) 100-25 MCG/ACT AEPB; Inhale 1 puff into the lungs daily.   Follow-up pending labs, yearly for physical

## 2022-11-05 NOTE — Patient Instructions (Signed)
This visit was a preventative care visit, also known as wellness visit or routine physical.   Topics typically include healthy lifestyle, diet, exercise, preventative care, vaccinations, sick and well care, proper use of emergency dept and after hours care, as well as other concerns.    Separate significant issues discussed: Skin lesion - new lesion right neck.  She will see dermatology soon for followup on her vulvar lesion and this new lesion  Anxiety and depression-continue on Lexapro 10 mg daily and Wellbutrin 75 mg daily  ADHD-doing fine on current therapy and Adderall 10 mg daily  Hypothyroidism-continue levothyroxine 100 mcg daily  Hyperlipidemia-continue rosuvastatin 10 mg daily  Vitamin D deficiency-continue vitamin D 1000 units daily  Intermittent asthma-continue albuterol inhaler rescue inhaler as needed.  PFT reviewed.  Given morning phlegm, we can consider some options for therapy.  Uses hearing aid  Osteopenia-continue to get calcium 1200 mg daily through diet and/or supplement, continue vitamin D supplement, order placed to do an updated bone density test  Thoracic aortic aneurysm-I reviewed her 2023 echocardiogram and her recent 05/2022 cardiac MRI/MRA.  History of endometrial cancer-she continues with surveillance with gynecology  GERD-she continues on famotidine Pepcid 20 mg daily    General Recommendations: Continue to return yearly for your annual wellness and preventative care visits.  This gives [place] a chance to discuss healthy lifestyle, exercise, vaccinations, review your chart record, and perform screenings where appropriate.  I recommend you see your eye doctor yearly for routine vision care.  I recommend you see your dentist yearly for routine dental care including hygiene visits twice yearly.   Vaccination recommendations were reviewed Immunization History  Administered Date(s) Administered   Influenza, High Dose Seasonal PF 11/28/2019, 11/26/2020    Influenza-Unspecified 12/21/2016, 12/01/2018, 10/30/2021   PFIZER(Purple Top)SARS-COV-2 Vaccination 03/24/2019, 04/16/2019, 12/12/2019   PNEUMOCOCCAL CONJUGATE-20 03/06/2021   Pfizer Covid-19 Vaccine Bivalent Booster 33yrs & up 11/26/2020   Pneumococcal Conjugate-13 02/06/2018   Tdap 06/02/2021   Zoster Recombinant(Shingrix) 12/01/2018, 05/08/2019    Vaccine recommendations: Counseled on the influenza virus vaccine.  Vaccine information sheet given.  Influenza vaccine given after consent obtained.    Screening for cancer: Colon cancer screening: Prior or last colon cancer screen: I reviewed her  01/01/22 colonoscopy from Dr. Particia Lather, Tubular adenoma noted, repeat in 5 years   Breast cancer screening: You should perform a self breast exam monthly.   Mammo up to date 11/2021   Skin cancer screening: Check your skin regularly for new changes, growing lesions, or other lesions of concern Come in for evaluation if you have skin lesions of concern.  Lung cancer screening: If you have a greater than 20 pack year history of tobacco use, then you may qualify for lung cancer screening with a chest CT scan.   Please call your insurance company to inquire about coverage for this test.  Pancreatic cancer: no current screening test is available routinely recommended.  (Risk factors: Smoking, overweight or obese, diabetes, chronic pancreatitis, work Nurse, mental health, Solicitor, 3 year old or greater, female greater than female, African-American, family history of pancreatic cancer, hereditary breast, ovarian, melanoma, Lynch, Peutz-jeghers).  We currently don't have screenings for other cancers besides breast, cervical, colon, and lung cancers.  If you have a strong family history of cancer or have other cancer screening concerns, please let me know.    Bone health: Get at least 150 minutes of aerobic exercise weekly Get weight bearing exercise at least once weekly Bone  density test:  A bone  density test is an imaging test that uses a type of X-ray to measure the amount of calcium and other minerals in your bones. The test may be used to diagnose or screen you for a condition that causes weak or thin bones (osteoporosis), predict your risk for a broken bone (fracture), or determine how well your osteoporosis treatment is working. The bone density test is recommended for females 65 and older, or females or males <65 if certain risk factors such as thyroid disease, long term use of steroids such as for asthma or rheumatological issues, vitamin D deficiency, estrogen deficiency, family history of osteoporosis, self or family history of fragility fracture in first degree relative.  Please call to schedule your bone density test.   [facility]  [id number] [address] [place], [place] [address]    Heart health: Get at least 150 minutes of aerobic exercise weekly Limit alcohol It is important to maintain a healthy blood pressure and healthy cholesterol numbers  Heart disease screening: Screening for heart disease includes screening for blood pressure, fasting lipids, glucose/diabetes screening, BMI height to weight ratio, reviewed of smoking status, physical activity, and diet.    Goals include blood pressure 120/80 or less, maintaining a healthy lipid/cholesterol profile, preventing diabetes or keeping diabetes numbers under good control, not smoking or using tobacco products, exercising most days per week or at least 150 minutes per week of exercise, and eating healthy variety of fruits and vegetables, healthy oils, and avoiding unhealthy food choices like fried food, fast food, high sugar and high cholesterol foods.    I reviwed your 2024 cardiac MRI/MRA done at [facility], stable.   Medical care options: I recommend you continue to seek care here first for routine care.  We try really hard to have available  appointments Monday through Friday daytime hours for sick visits, acute visits, and physicals.  Urgent care should be used for after hours and weekends for significant issues that cannot wait till the next day.  The emergency department should be used for significant potentially life-threatening emergencies.  The emergency department is expensive, can often have long wait times for less significant concerns, so try to utilize primary care, urgent care, or telemedicine when possible to avoid unnecessary trips to the emergency department.  Virtual visits and telemedicine have been introduced since the pandemic started in 2020, and can be convenient ways to receive medical care.  We offer virtual appointments as well to assist you in a variety of options to seek medical care.   Legal Take the time to do a Last Will and Testament, advanced directives including Healthcare Power of Attorney and Living Will documents.  Do not leave your family with burdens that can be handled ahead of time.   Advanced Directives: I recommend you consider completing a Health Care Power of Attorney and Living Will.   These documents respect your wishes and help alleviate burdens on your loved ones if you were to become terminally ill or be in a position to need those documents enforced.    You can complete Advanced Directives yourself, have them notarized, then have copies made for our office, for you and for anybody you feel should have them in safe keeping.  Or, you can have an attorney prepare these documents.   If you haven't updated your Last Will and Testament in a while, it may be worthwhile having an attorney prepare these documents together and save on some costs.

## 2022-11-06 LAB — COMPREHENSIVE METABOLIC PANEL
ALT: 19 IU/L (ref 0–32)
AST: 26 IU/L (ref 0–40)
Albumin: 4.5 g/dL (ref 3.9–4.9)
Alkaline Phosphatase: 91 IU/L (ref 44–121)
BUN/Creatinine Ratio: 14 (ref 12–28)
BUN: 14 mg/dL (ref 8–27)
Bilirubin Total: 0.5 mg/dL (ref 0.0–1.2)
CO2: 23 mmol/L (ref 20–29)
Calcium: 10.1 mg/dL (ref 8.7–10.3)
Chloride: 100 mmol/L (ref 96–106)
Creatinine, Ser: 1.01 mg/dL — ABNORMAL HIGH (ref 0.57–1.00)
Globulin, Total: 2.7 g/dL (ref 1.5–4.5)
Glucose: 98 mg/dL (ref 70–99)
Potassium: 5.2 mmol/L (ref 3.5–5.2)
Sodium: 139 mmol/L (ref 134–144)
Total Protein: 7.2 g/dL (ref 6.0–8.5)
eGFR: 60 mL/min/{1.73_m2} (ref >59)

## 2022-11-06 LAB — LIPID PANEL
Chol/HDL Ratio: 2 ratio (ref 0.0–4.4)
Cholesterol, Total: 173 mg/dL (ref 100–199)
HDL: 86 mg/dL (ref >39)
LDL Chol Calc (NIH): 73 mg/dL (ref 0–99)
Triglycerides: 74 mg/dL (ref 0–149)
VLDL Cholesterol Cal: 14 mg/dL (ref 5–40)

## 2022-11-06 LAB — CBC
Hematocrit: 38.2 % (ref 34.0–46.6)
Hemoglobin: 11.8 g/dL (ref 11.1–15.9)
MCH: 25.5 pg — ABNORMAL LOW (ref 26.6–33.0)
MCHC: 30.9 g/dL — ABNORMAL LOW (ref 31.5–35.7)
MCV: 83 fL (ref 79–97)
Platelets: 306 10*3/uL (ref 150–450)
RBC: 4.62 x10E6/uL (ref 3.77–5.28)
RDW: 15 % (ref 11.7–15.4)
WBC: 7.4 10*3/uL (ref 3.4–10.8)

## 2022-11-06 LAB — HEMOGLOBIN A1C
Est. average glucose Bld gHb Est-mCnc: 120 mg/dL
Hgb A1c MFr Bld: 5.8 % — ABNORMAL HIGH (ref 4.8–5.6)

## 2022-11-06 LAB — TSH+FREE T4
Free T4: 1.62 ng/dL (ref 0.82–1.77)
TSH: 0.37 u[IU]/mL — ABNORMAL LOW (ref 0.450–4.500)

## 2022-11-06 LAB — VITAMIN D 25 HYDROXY (VIT D DEFICIENCY, FRACTURES): Vit D, 25-Hydroxy: 55.9 ng/mL (ref 30.0–100.0)

## 2022-11-08 ENCOUNTER — Other Ambulatory Visit: Payer: Self-pay | Admitting: Medical

## 2022-11-08 MED ORDER — VITAMIN D3 25 MCG (1000 UNIT) PO TABS: 1000.0000 [IU] | ORAL_TABLET | Freq: Every day | ORAL | 2 refills | Status: DC

## 2022-11-08 MED ORDER — BUPROPION HCL 75 MG PO TABS: 75.0000 mg | ORAL_TABLET | Freq: Every day | ORAL | 2 refills | Status: DC

## 2022-11-08 MED ORDER — LEVOTHYROXINE SODIUM 88 MCG PO TABS: 88.0000 ug | ORAL_TABLET | Freq: Every day | ORAL | 0 refills | Status: DC

## 2022-11-08 NOTE — Progress Notes (Signed)
Results sent through MyChart

## 2022-11-15 MED ORDER — LEVOTHYROXINE SODIUM 88 MCG PO TABS: 88.0000 ug | ORAL_TABLET | Freq: Every day | ORAL | 1 refills | Status: DC

## 2022-11-15 MED ORDER — BUPROPION HCL 75 MG PO TABS: 75.0000 mg | ORAL_TABLET | Freq: Every day | ORAL | 1 refills | Status: DC

## 2022-12-17 ENCOUNTER — Other Ambulatory Visit: Payer: Self-pay | Admitting: Medical

## 2022-12-17 DIAGNOSIS — M8580 Other specified disorders of bone density and structure, unspecified site: Secondary | ICD-10-CM

## 2022-12-17 DIAGNOSIS — Z1231 Encounter for screening mammogram for malignant neoplasm of breast: Secondary | ICD-10-CM

## 2022-12-17 DIAGNOSIS — Z780 Asymptomatic menopausal state: Secondary | ICD-10-CM

## 2022-12-23 MED ORDER — FLUTICASONE FUROATE-VILANTEROL 100-25 MCG/ACT IN AEPB: 1.0000 | INHALATION_SPRAY | Freq: Every day | RESPIRATORY_TRACT | 3 refills | Status: DC

## 2022-12-30 ENCOUNTER — Other Ambulatory Visit: Payer: Self-pay | Admitting: Medical

## 2023-01-10 ENCOUNTER — Ambulatory Visit (INDEPENDENT_AMBULATORY_CARE_PROVIDER_SITE_OTHER): Payer: Medicare Other | Admitting: Medical

## 2023-01-10 ENCOUNTER — Encounter: Payer: Self-pay | Admitting: Medical

## 2023-01-10 VITALS — BP 108/76 | HR 75 | Temp 98.0°F | Wt 141.4 lb

## 2023-01-10 DIAGNOSIS — J45909 Unspecified asthma, uncomplicated: Secondary | ICD-10-CM

## 2023-01-10 DIAGNOSIS — J988 Other specified respiratory disorders: Secondary | ICD-10-CM

## 2023-01-10 DIAGNOSIS — R051 Acute cough: Secondary | ICD-10-CM

## 2023-01-10 MED ORDER — ALBUTEROL SULFATE HFA 108 (90 BASE) MCG/ACT IN AERS: 2.0000 | INHALATION_SPRAY | Freq: Four times a day (QID) | RESPIRATORY_TRACT | 0 refills | Status: DC | PRN

## 2023-01-10 MED ORDER — BENZONATATE 200 MG PO CAPS: 200.0000 mg | ORAL_CAPSULE | Freq: Three times a day (TID) | ORAL | 0 refills | Status: DC | PRN

## 2023-01-10 MED ORDER — AZITHROMYCIN 250 MG PO TABS
ORAL_TABLET | ORAL | 0 refills | Status: DC
Start: 2023-01-10 — End: 2023-04-12

## 2023-01-10 NOTE — Progress Notes (Signed)
Subjective:  Jamie Singh is a 72 y.o. female who presents for Chief Complaint  Patient presents with   URI    Cough, light headed, dizzy. Medication caused sweats, dizziness, chills. OTC began working but still made her dizzing. Stopped all meds Sunday. Neg covid test.      Here for cough.  Started a little over a week ago.  For the past week has had productive cough, cough fits, congestion.  Currently still has cough, runny nose from sinuses, productive cough, green to brown, dizziness.  No sore throat.  No nausea or vomiting in last few days.  No wheezing, but feels a little SOB.  Used rescue inhaler some.  No headache or ear pain.  No hemoptysis.    Complicating things, took some leftover cough syrup from months ago (tussionex), and this seemed to cause skin itching and dizziness.  She has tolerated this before without this problem but thinks it now gives her problems similar to oxycodone.    She also has had  sweats, nausea, dizziness in the past week.  Took some over the counter medication after the reaction with prescription cough syrup.    Last weekend before all this started [place] around [place] and [place].  No other aggravating or relieving factors.    No other c/o.  Past Medical History:  Diagnosis Date   ADHD    Allergy    Anxiety and depression    Aortic aneurysm (HCC)    Asthma    mild, but prior on preventative FLovent   Endometrial cancer (HCC) 2020   Genital herpes    GERD (gastroesophageal reflux disease)    Hyperlipidemia    Hypothyroidism    Osteoporosis    Thoracic aortic aneurysm (HCC)    Wears hearing aid    Current Outpatient Medications on File Prior to Visit  Medication Sig Dispense Refill   amphetamine-dextroamphetamine (ADDERALL) 10 MG tablet Take 1 tablet (10 mg total) by mouth daily with breakfast. 90 tablet 0   buPROPion (WELLBUTRIN) 75 MG tablet Take 1 tablet (75 mg total) by mouth daily. 90 tablet 1    Calcium-Magnesium 250-125 MG TABS Take 2 tablets by mouth daily.     cholecalciferol (VITAMIN D3) 25 MCG (1000 UNIT) tablet Take 1 tablet (1,000 Units total) by mouth daily. 90 tablet 2   escitalopram (LEXAPRO) 10 MG tablet TAKE 1 TABLET DAILY 90 tablet 0   famotidine (PEPCID) 20 MG tablet TAKE 1 TABLET AT BEDTIME 90 tablet 1   fluticasone furoate-vilanterol (BREO ELLIPTA) 100-25 MCG/ACT AEPB Inhale 1 puff into the lungs daily. 90 each 3   levothyroxine (SYNTHROID) 88 MCG tablet Take 1 tablet (88 mcg total) by mouth daily. 90 tablet 1   PSYLLIUM HUSK PO Take 2,000 mg by mouth daily.     rosuvastatin (CRESTOR) 10 MG tablet TAKE 1 TABLET DAILY 90 tablet 1   SSD 1 % cream      valACYclovir (VALTREX) 500 MG tablet Take 1 tablet (500 mg total) by mouth daily. 90 tablet 3   estradiol (ESTRACE VAGINAL) 0.1 MG/GM vaginal cream Use small fingertip size amount of cream at the entrance of the vagina at bedtime three times a week for the next two weeks (Patient not taking: Reported on 01/10/2023) 42.5 g 0   fluocinonide ointment (LIDEX) 0.05 %  (Patient not taking: Reported on 01/10/2023)     No current facility-administered medications on file prior to visit.  The following portions of the patient's history were reviewed and updated as appropriate: allergies, current medications, past family history, past medical history, past social history, past surgical history and problem list.  ROS Otherwise as in subjective above    Objective: BP 108/76   Pulse 75   Temp 98 F (36.7 C) (Oral)   Wt 141 lb 6.4 oz (64.1 kg)   SpO2 95%   BMI 28.08 kg/m   General appearance: alert, no distress, well developed, well nourished HEENT: normocephalic, sclerae anicteric, conjunctiva pink and moist, TMs flat, nares patent, some dried mucoid discharge,  +erythema, pharynx normal Oral cavity: MMM, no lesions Neck: supple, no lymphadenopathy, no thyromegaly, no masses Heart: RRR, normal S1, S2, no  murmurs Lungs: Somewhat decreased breath sounds in general but no obvious dullness, no wheezes, rhonchi, or rales Pulses: 2+ radial pulses, 2+ pedal pulses, normal cap refill Ext: no edema   Assessment: Encounter Diagnoses  Name Primary?   Acute cough Yes   Respiratory tract infection    Moderate asthma, unspecified whether complicated, unspecified whether persistent      Plan: Continue to hydrate well, can use over-the-counter Mucinex for mucus and congestion, begin Z-Pak antibiotic, can use Tessalon Perle cough drops as needed.  I updated her allergies and medicine intolerances.  Continue inhaler but actually use it 2 or 3 times a day for few days given the cough fits.  If not much improved by Wednesday in 2 days MyChart message or call back as we may add steroids at that time.  Otherwise f/u prn   Stacey was seen today for uri.  Diagnoses and all orders for this visit:  Acute cough  Respiratory tract infection  Moderate asthma, unspecified whether complicated, unspecified whether persistent  Other orders -     benzonatate (TESSALON) 200 MG capsule; Take 1 capsule (200 mg total) by mouth 3 (three) times daily as needed for cough. -     azithromycin (ZITHROMAX) 250 MG tablet; 2 tablets day 1, then 1 tablet days 2-4 -     albuterol (VENTOLIN HFA) 108 (90 Base) MCG/ACT inhaler; Inhale 2 puffs into the lungs every 6 (six) hours as needed for wheezing or shortness of breath.    Follow up: prn

## 2023-01-13 ENCOUNTER — Ambulatory Visit
Admission: RE | Admit: 2023-01-13 | Discharge: 2023-01-13 | Disposition: A | Discharge status: Home / Self Care | Payer: Medicare Other | Source: Ambulatory Visit | Attending: Medical | Admitting: Medical

## 2023-01-13 DIAGNOSIS — Z1231 Encounter for screening mammogram for malignant neoplasm of breast: Secondary | ICD-10-CM

## 2023-01-17 NOTE — Progress Notes (Signed)
Results sent through MyChart

## 2023-02-07 ENCOUNTER — Other Ambulatory Visit: Payer: Self-pay

## 2023-02-07 ENCOUNTER — Encounter (HOSPITAL_BASED_OUTPATIENT_CLINIC_OR_DEPARTMENT_OTHER): Payer: Self-pay

## 2023-02-07 ENCOUNTER — Emergency Department (HOSPITAL_BASED_OUTPATIENT_CLINIC_OR_DEPARTMENT_OTHER)
Admission: EM | Admit: 2023-02-07 | Discharge: 2023-02-07 | Disposition: A | Discharge status: Home / Self Care | Payer: Medicare Other

## 2023-02-07 ENCOUNTER — Emergency Department (HOSPITAL_BASED_OUTPATIENT_CLINIC_OR_DEPARTMENT_OTHER): Payer: Medicare Other | Admitting: Radiology

## 2023-02-07 ENCOUNTER — Emergency Department (HOSPITAL_BASED_OUTPATIENT_CLINIC_OR_DEPARTMENT_OTHER): Payer: Medicare Other

## 2023-02-07 DIAGNOSIS — Z8542 Personal history of malignant neoplasm of other parts of uterus: Secondary | ICD-10-CM | POA: Diagnosis not present

## 2023-02-07 DIAGNOSIS — R079 Chest pain, unspecified: Secondary | ICD-10-CM | POA: Insufficient documentation

## 2023-02-07 DIAGNOSIS — R911 Solitary pulmonary nodule: Secondary | ICD-10-CM | POA: Insufficient documentation

## 2023-02-07 LAB — BASIC METABOLIC PANEL
Anion gap: 9 (ref 5–15)
BUN: 27 mg/dL — ABNORMAL HIGH (ref 8–23)
CO2: 26 mmol/L (ref 22–32)
Calcium: 9.5 mg/dL (ref 8.9–10.3)
Chloride: 102 mmol/L (ref 98–111)
Creatinine, Ser: 1.07 mg/dL — ABNORMAL HIGH (ref 0.44–1.00)
GFR, Estimated: 56 mL/min — ABNORMAL LOW (ref >60)
Glucose, Bld: 102 mg/dL — ABNORMAL HIGH (ref 70–99)
Potassium: 4 mmol/L (ref 3.5–5.1)
Sodium: 137 mmol/L (ref 135–145)

## 2023-02-07 LAB — TROPONIN I (HIGH SENSITIVITY)
Troponin I (High Sensitivity): 3 ng/L (ref <18)
Troponin I (High Sensitivity): 3 ng/L (ref <18)

## 2023-02-07 LAB — CBC
HCT: 36.8 % (ref 36.0–46.0)
Hemoglobin: 12 g/dL (ref 12.0–15.0)
MCH: 27.1 pg (ref 26.0–34.0)
MCHC: 32.6 g/dL (ref 30.0–36.0)
MCV: 83.1 fL (ref 80.0–100.0)
Platelets: 291 10*3/uL (ref 150–400)
RBC: 4.43 MIL/uL (ref 3.87–5.11)
RDW: 18.2 % — ABNORMAL HIGH (ref 11.5–15.5)
WBC: 8.3 10*3/uL (ref 4.0–10.5)
nRBC: 0 % (ref 0.0–0.2)

## 2023-02-07 MED ORDER — IOHEXOL 350 MG/ML SOLN
100.0000 mL | Freq: Once | INTRAVENOUS | Status: AC | PRN
  Administered 2023-02-07: 75 mL via INTRAVENOUS

## 2023-02-07 NOTE — ED Notes (Addendum)
Note error

## 2023-02-07 NOTE — Discharge Instructions (Addendum)
Your workup today was reassuring.  Your CT scan did not show any concerning findings regarding your aorta.  There was a small pulmonary nodule.  Please follow-up with your doctor to repeat some imaging at some point in the future.  I do not think this is the cause of your symptoms.  I have placed a consult to our cardiology team.  You should receive a call in the next day or 2 for instructions on follow-up.  Please follow-up with them.  Return to the ER for worsening symptoms.

## 2023-02-07 NOTE — ED Provider Notes (Addendum)
[place] EMERGENCY DEPARTMENT AT [facility] Provider Note   CSN: [id number] Arrival date & time: 02/07/23  1450     History  Chief Complaint  Patient presents with   Chest Pain    Substernal, R sided    Jamie Singh is a 72 y.o. female.  72 year old female with past medical history of thoracic aortic aneurysm and hyperlipidemia presenting to the emergency department today with right-sided chest pain.  The patient states this began this morning at around 10 AM.  Started with pain in the right side of her chest.  She states that the pain has been coming and going this morning.  She denies any exacerbating or alleviating factors.  She states that this was more severe before she arrived in the emergency department.  She states that she has had another episode since she has been here the but this was a few minutes ago.  She reports the pain has improved now and is very dull in character currently.  She denies any focal weakness, numbness, or tingling.  She does have an aortic aneurysm and has been followed at [facility] for this.  She also reports a history of endometrial cancer but is in remission as far she knows.  She has apparently been in remission for years.  She denies a history of DVT or pulmonary embolism, recent surgeries, recent travel.  Denies any hemoptysis.   Chest Pain      Home Medications Prior to Admission medications   Medication Sig Start Date End Date Taking? Authorizing Provider  albuterol (VENTOLIN HFA) 108 (90 Base) MCG/ACT inhaler Inhale 2 puffs into the lungs every 6 (six) hours as needed for wheezing or shortness of breath. 01/10/23   Tysinger, Kermit Balo, PA-C  amphetamine-dextroamphetamine (ADDERALL) 10 MG tablet Take 1 tablet (10 mg total) by mouth daily with breakfast. 10/21/22   Tysinger, Kermit Balo, PA-C  azithromycin (ZITHROMAX) 250 MG tablet 2 tablets day 1, then 1 tablet days 2-4 01/10/23   Tysinger, Kermit Balo, PA-C  benzonatate (TESSALON) 200 MG  capsule Take 1 capsule (200 mg total) by mouth 3 (three) times daily as needed for cough. 01/10/23   Tysinger, Kermit Balo, PA-C  buPROPion (WELLBUTRIN) 75 MG tablet Take 1 tablet (75 mg total) by mouth daily. 11/15/22   Tysinger, Kermit Balo, PA-C  Calcium-Magnesium 250-125 MG TABS Take 2 tablets by mouth daily.    [provider]  cholecalciferol (VITAMIN D3) 25 MCG (1000 UNIT) tablet Take 1 tablet (1,000 Units total) by mouth daily. 11/08/22   Tysinger, Kermit Balo, PA-C  escitalopram (LEXAPRO) 10 MG tablet TAKE 1 TABLET DAILY 12/30/22   Tysinger, Kermit Balo, PA-C  estradiol (ESTRACE VAGINAL) 0.1 MG/GM vaginal cream Use small fingertip size amount of cream at the entrance of the vagina at bedtime three times a week for the next two weeks Patient not taking: Reported on 01/10/2023 04/26/22   Warner Mccreedy D, NP  famotidine (PEPCID) 20 MG tablet TAKE 1 TABLET AT BEDTIME 08/20/22   Tysinger, Kermit Balo, PA-C  fluocinonide ointment (LIDEX) 0.05 %  10/11/22   [provider]  fluticasone furoate-vilanterol (BREO ELLIPTA) 100-25 MCG/ACT AEPB Inhale 1 puff into the lungs daily. 12/23/22   Tysinger, Kermit Balo, PA-C  levothyroxine (SYNTHROID) 88 MCG tablet Take 1 tablet (88 mcg total) by mouth daily. 11/15/22 11/15/23  Tysinger, Kermit Balo, PA-C  PSYLLIUM HUSK PO Take 2,000 mg by mouth daily.    [provider]  rosuvastatin (CRESTOR) 10 MG tablet TAKE  1 TABLET DAILY 08/20/22   Tysinger, Kermit Balo, PA-C  SSD 1 % cream  11/01/22   [provider]  valACYclovir (VALTREX) 500 MG tablet Take 1 tablet (500 mg total) by mouth daily. 05/17/22   Tysinger, Kermit Balo, PA-C      Allergies    Pollen extract, Hydrocodone, Oxycodone, and Quinolones    Review of Systems   Review of Systems  Cardiovascular:  Positive for chest pain.  All other systems reviewed and are negative.   Physical Exam Updated Vital Signs BP 106/73   Pulse 69   Temp 97.8 F (36.6 C)   Resp (!) 24   SpO2 93%  Physical Exam Vitals  and nursing note reviewed.   Gen: NAD Eyes: PERRL, EOMI HEENT: no oropharyngeal swelling Neck: trachea midline Resp: clear to auscultation bilaterally Card: RRR, no murmurs, rubs, or gallops Abd: nontender, nondistended Extremities: no calf tenderness, no edema Vascular: 2+ radial pulses bilaterally, 2+ DP pulses bilaterally Neuro: No focal deficits Skin: no rashes Psyc: acting appropriately   ED Results / Procedures / Treatments   Labs (all labs ordered are listed, but only abnormal results are displayed) Labs Reviewed  BASIC METABOLIC PANEL - Abnormal; Notable for the following components:      Result Value   Glucose, Bld 102 (*)    BUN 27 (*)    Creatinine, Ser 1.07 (*)    GFR, Estimated 56 (*)    All other components within normal limits  CBC - Abnormal; Notable for the following components:   RDW 18.2 (*)    All other components within normal limits  TROPONIN I (HIGH SENSITIVITY)  TROPONIN I (HIGH SENSITIVITY)    EKG EKG Interpretation Date/Time:  Monday February 07 2023 15:02:10 EST Ventricular Rate:  77 PR Interval:  138 QRS Duration:  82 QT Interval:  402 QTC Calculation: 454 R Axis:   64  Text Interpretation: Normal sinus rhythm Normal ECG No previous ECGs available Confirmed by Beckey Downing [phone]) on 02/07/2023 3:33:29 PM  Radiology CT Angio Chest Aorta W and/or Wo Contrast  Result Date: 02/07/2023 CLINICAL DATA:  Chest pain. Concern for aortic aneurysm or dissection. EXAM: CT ANGIOGRAPHY CHEST WITH CONTRAST TECHNIQUE: Multidetector CT imaging of the chest was performed using the standard protocol during bolus administration of intravenous contrast. Multiplanar CT image reconstructions and MIPs were obtained to evaluate the vascular anatomy. RADIATION DOSE REDUCTION: This exam was performed according to the departmental dose-optimization program which includes automated exposure control, adjustment of the mA and/or kV according to patient size and/or use of  iterative reconstruction technique. CONTRAST:  75mL OMNIPAQUE IOHEXOL 350 MG/ML SOLN COMPARISON:  Chest radiograph dated 02/07/2023. FINDINGS: Cardiovascular: There is no cardiomegaly or pericardial effusion. Top-normal ascending aorta measuring up to 4 cm in diameter. No aortic dissection or periaortic fluid collection. The origins of the great vessels of the aortic arch appear patent. No pulmonary artery embolus identified. Mediastinum/Nodes: There is no hilar or mediastinal adenopathy. Moderate size hiatal hernia. The esophagus is grossly unremarkable. No mediastinal fluid collection. Lungs/Pleura: There is a 4 mm right middle lobe subpleural nodule. No focal consolidation, pleural effusion, or pneumothorax. The central airways are patent. Upper Abdomen: No acute abnormality. Musculoskeletal: Osteopenia with degenerative changes. No acute osseous pathology. Review of the MIP images confirms the above findings. IMPRESSION: 1. No acute intrathoracic pathology. No aortic dissection or aneurysm. 2. Moderate size hiatal hernia. 3. A 4 mm right middle lobe subpleural nodule. No follow-up needed if patient is low-risk.This  recommendation follows the consensus statement: Guidelines for Management of Incidental Pulmonary Nodules Detected on CT Images: From the Fleischner Society 2017; Radiology 2017; 284:228-243. Electronically Signed   By: Elgie Collard M.D.   On: 02/07/2023 18:56   DG Chest 2 View  Result Date: 02/07/2023 CLINICAL DATA:  Chest pain. EXAM: CHEST - 2 VIEW COMPARISON:  None Available. FINDINGS: The heart size and mediastinal contours are within normal limits. Moderate size hiatal hernia. Both lungs are clear. The visualized skeletal structures are unremarkable. IMPRESSION: No active cardiopulmonary disease.  Moderate size hiatal hernia. Electronically Signed   By: Lupita Raider M.D.   On: 02/07/2023 16:41    Procedures Procedures    Medications Ordered in ED Medications  iohexol  (OMNIPAQUE) 350 MG/ML injection 100 mL (75 mLs Intravenous Contrast Given 02/07/23 1724)    ED Course/ Medical Decision Making/ A&P                                 Medical Decision Making 72 year old female with past medical history of hyperlipidemia and thoracic aortic aneurysm presents emergency department today with right-sided chest pain.  I will further evaluate patient here with basic labs Wels and EKG, chest x-ray, and troponin for further evaluation for ACS, pulmonary edema, pulmonary infiltrates, or pneumothorax.  If this is not revealing for cause for her pain will obtain a CT angiogram to evaluate for aortic pathology.  Patient ports that her symptoms have improved.  This may be due to musculoskeletal pain but this is a diagnosis of exclusion.  Will reevaluate for ultimate disposition.  The patient's chest x-ray interpreted by me shows no infiltrates, no pulmonary edema, no pneumothorax.  A CT angiogram is ordered.  The patient's second troponin as well as her CT angiogram were negative.  I think she is stable for discharge with cardiology follow-up.  There was a pulmonary nodule.  She is encouraged to follow-up with her primary care provider regarding this since she does have a history of uterine cancer she will likely require repeat imaging.  She does voiced understanding.  Amount and/or Complexity of Data Reviewed Labs: ordered. Radiology: ordered.  Risk Prescription drug management.           Final Clinical Impression(s) / ED Diagnoses Final diagnoses:  Chest pain, unspecified type  Pulmonary nodule    Rx / DC Orders ED Discharge Orders          Ordered    Ambulatory referral to Cardiology       Comments: If you have not heard from the Cardiology office within the next 72 hours please call [phone].   02/07/23 1930              Durwin Glaze, MD 02/07/23 1932    Durwin Glaze, MD 02/07/23 [phone]

## 2023-02-07 NOTE — ED Triage Notes (Signed)
Pt c/o "weird sharp pains right under my breastbone, on the R side, not the L side." Onset approx 10a, "just like a catch, but it's gotten worse."   Hx thoracic aortic aneurysm, regular cards appts.

## 2023-02-16 ENCOUNTER — Other Ambulatory Visit: Payer: Self-pay | Admitting: Medical

## 2023-02-17 MED ORDER — LEVOTHYROXINE SODIUM 88 MCG PO TABS: 88.0000 ug | ORAL_TABLET | Freq: Every day | ORAL | 1 refills | Status: DC

## 2023-02-21 ENCOUNTER — Other Ambulatory Visit: Payer: Self-pay | Admitting: Medical

## 2023-02-21 MED ORDER — AMPHETAMINE-DEXTROAMPHETAMINE 10 MG PO TABS: 10.0000 mg | ORAL_TABLET | Freq: Every day | ORAL | 0 refills | Status: DC

## 2023-03-30 ENCOUNTER — Other Ambulatory Visit: Payer: Self-pay | Admitting: Medical

## 2023-04-06 NOTE — Progress Notes (Signed)
Cardiology Office Note:    Date:  04/12/2023   ID:  Jamie Singh, DOB 05/29/1950, MRN [id number]  PCP:  Genia Del   [facility] Providers Cardiologist:  None     Referring MD: Jac Canavan, PA-C   Chief Complaint  Patient presents with   Chest Pain    History of Present Illness:    Jamie Singh is a 73 y.o. female is seen at the request of Crosby Oyster PA-C for evaluation of chest pain. She has a history of HLD and thoracic aortic aneurysm. Prior evaluation at [facility] with cardiac/chest MR in 2024 showed normal LV function with mild AI. Aortic dimension 43 mm. Echo in 2023 showed moderate AI otherwise normal. Prior CT chest showed no coronary calcification.   She was seen in ED in Dec with atypical chest pain. Felt mild twinges of pain in breast bone. In ED evaluation was negative including CXR, Ecg and troponins. Has not had pain since then. Stays active doing water exercises 2 days a week, works with a Psychologist, educational. 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 She previously lived in [place] but moved here in 2021.   Past Medical History:  Diagnosis Date   ADHD    Allergy    Anxiety and depression    Aortic aneurysm (HCC)    Asthma    mild, but prior on preventative FLovent   Endometrial cancer (HCC) 2020   Genital herpes    GERD (gastroesophageal reflux disease)    Hyperlipidemia    Hypothyroidism    Osteoporosis    Thoracic aortic aneurysm (HCC)    Wears hearing aid     Past Surgical History:  Procedure Laterality Date   ABDOMINAL HYSTERECTOMY     COLONOSCOPY  2019   ESOPHAGOGASTRODUODENOSCOPY ENDOSCOPY     ROBOTIC ASSISTED TOTAL HYSTERECTOMY WITH BILATERAL SALPINGO OOPHERECTOMY     RA-TLH/BSO   TONSILLECTOMY AND ADENOIDECTOMY     WRIST FRACTURE SURGERY Right     Current Medications: Current Meds  Medication Sig   albuterol (VENTOLIN HFA) 108 (90 Base) MCG/ACT inhaler Inhale 2 puffs into the lungs every 6 (six) hours as needed  for wheezing or shortness of breath.   amphetamine-dextroamphetamine (ADDERALL) 10 MG tablet Take 1 tablet (10 mg total) by mouth daily with breakfast.   buPROPion (WELLBUTRIN) 75 MG tablet Take 1 tablet (75 mg total) by mouth daily.   Calcium-Magnesium 250-125 MG TABS Take 2 tablets by mouth daily.   cholecalciferol (VITAMIN D3) 25 MCG (1000 UNIT) tablet Take 1 tablet (1,000 Units total) by mouth daily.   escitalopram (LEXAPRO) 10 MG tablet TAKE 1 TABLET DAILY   estradiol (ESTRACE VAGINAL) 0.1 MG/GM vaginal cream Use small fingertip size amount of cream at the entrance of the vagina at bedtime three times a week for the next two weeks   famotidine (PEPCID) 20 MG tablet TAKE 1 TABLET AT BEDTIME   fluocinonide ointment (LIDEX) 0.05 %    fluticasone furoate-vilanterol (BREO ELLIPTA) 100-25 MCG/ACT AEPB Inhale 1 puff into the lungs daily.   levothyroxine (SYNTHROID) 88 MCG tablet Take 1 tablet (88 mcg total) by mouth daily.   PSYLLIUM HUSK PO Take 2,000 mg by mouth daily.   rosuvastatin (CRESTOR) 10 MG tablet TAKE 1 TABLET DAILY   SSD 1 % cream    valACYclovir (VALTREX) 500 MG tablet Take 1 tablet (500 mg total) by mouth daily.     Allergies:   Pollen extract, Hydrocodone, Oxycodone, and Quinolones  Social History   Socioeconomic History   Marital status: Married    Spouse name: Not on file   Number of children: Not on file   Years of education: Not on file   Highest education level: Bachelor's degree (e.g., BA, AB, BS)  Occupational History   Occupation: Hydrographic surveyor - retired  Tobacco Use   Smoking status: Never   Smokeless tobacco: Never  Vaping Use   Vaping status: Never Used  Substance and Sexual Activity   Alcohol use: Yes    Alcohol/week: 2.0 - 3.0 standard drinks of alcohol    Types: 2 - 3 Glasses of wine per week    Comment: 2-4 times monthly   Drug use: Yes    Frequency: 2.0 times per week    Types: Marijuana    Comment: gummies   Sexual activity: Not Currently     Birth control/protection: Post-menopausal  Other Topics Concern   Not on file  Social History Narrative   Lives with husband and dog.  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   Social Drivers of Health   Financial Resource Strain: Low Risk  (08/07/2022)   Overall Financial Resource Strain (CARDIA)    Difficulty of Paying Living Expenses: Not hard at all  Food Insecurity: No Food Insecurity (08/07/2022)   Hunger Vital Sign    Worried About Running Out of Food in the Last Year: Never true    Ran Out of Food in the Last Year: Never true  Transportation Needs: No Transportation Needs (08/07/2022)   PRAPARE - Administrator, Civil Service (Medical): No    Lack of Transportation (Non-Medical): No  Physical Activity: Sufficiently Active (08/07/2022)   Exercise Vital Sign    Days of Exercise per Week: 4 days    Minutes of Exercise per Session: 60 min  Stress: No Stress Concern Present (08/07/2022)   [employer] of Occupational Health - Occupational Stress Questionnaire    Feeling of Stress : Only a little  Social Connections: Unknown (08/07/2022)   Social Connection and Isolation Panel [NHANES]    Frequency of Communication with Friends and Family: More than three times a week    Frequency of Social Gatherings with Friends and Family: Three times a week    Attends Religious Services: Not on file    Active Member of Clubs or Organizations: No    Attends Banker Meetings: Never    Marital Status: Married     Family History: The patient's family history includes Aortic aneurysm in her father; COPD in her father, maternal grandfather, and mother; Cancer in her mother; Heart disease in her brother and father; Heart failure in her maternal grandfather, maternal grandmother, and mother; Macular degeneration in her mother; Myelodysplastic syndrome in her brother; Osteoporosis in her maternal grandmother and mother; Stroke in her paternal grandfather. There is no history of  Breast cancer, Colon cancer, Colon polyps, Esophageal cancer, Rectal cancer, or Stomach cancer.  ROS:   Please see the history of present illness.     All other systems reviewed and are negative.  EKGs/Labs/Other Studies Reviewed:    The following studies were reviewed today: See HPI      Recent Labs: 11/05/2022: ALT 19; TSH 0.370 02/07/2023: BUN 27; Creatinine, Ser 1.07; Hemoglobin 12.0; Platelets 291; Potassium 4.0; Sodium 137  Recent Lipid Panel    Component Value Date/Time   CHOL 173 11/05/2022 1046   TRIG 74 11/05/2022 1046   HDL 86 11/05/2022 1046   CHOLHDL 2.0 11/05/2022  1046   LDLCALC 73 11/05/2022 1046     Risk Assessment/Calculations:                Physical Exam:    VS:  BP 113/72   Pulse (!) 118   Ht 4\' 11"  (1.499 m)   Wt 142 lb 12.8 oz (64.8 kg)   SpO2 98%   BMI 28.84 kg/m     Wt Readings from Last 3 Encounters:  04/12/23 142 lb 12.8 oz (64.8 kg)  01/10/23 141 lb 6.4 oz (64.1 kg)  11/05/22 140 lb 6.4 oz (63.7 kg)     GEN:  Well nourished, well developed in no acute distress HEENT: Normal NECK: No JVD; No carotid bruits LYMPHATICS: No lymphadenopathy CARDIAC: RRR, no murmurs, rubs, gallops RESPIRATORY:  Clear to auscultation without rales, wheezing or rhonchi  ABDOMEN: Soft, non-tender, non-distended MUSCULOSKELETAL:  No edema; No deformity  SKIN: Warm and dry NEUROLOGIC:  Alert and oriented x 3 PSYCHIATRIC:  Normal affect   ASSESSMENT:    1. Aneurysm of ascending aorta without rupture (HCC)   2. Nonrheumatic aortic valve insufficiency    PLAN:    In order of problems listed above:  Atypical chest pain. Low risk of CAD given 0 calcium score. Ecg not acute. Reassured Thoracic aortic aneurysm. Stable since 2020 with dimension of 43 mm. Has mild AI related to this. Patient plans to continue follow up at [facility] every other year.   No further cardiac work up at this time. Will see PRN           Medication Adjustments/Labs and Tests  Ordered: Current medicines are reviewed at length with the patient today.  Concerns regarding medicines are outlined above.  No orders of the defined types were placed in this encounter.  No orders of the defined types were placed in this encounter.   Patient Instructions  Medication Instructions:  Continue same medications   Lab Work: None ordered   Testing/Procedures: None ordered   Follow-Up: At [facility], you and your health needs are our priority.  As part of our continuing mission to provide you with exceptional heart care, we have created designated Provider Care Teams.  These Care Teams include your primary Cardiologist (physician) and Advanced Practice Providers (APPs -  Physician Assistants and Nurse Practitioners) who all work together to provide you with the care you need, when you need it.  We recommend signing up for the patient portal called "MyChart".  Sign up information is provided on this After Visit Summary.  MyChart is used to connect with patients for Virtual Visits (Telemedicine).  Patients are able to view lab/test results, encounter notes, upcoming appointments, etc.  Non-urgent messages can be sent to your provider as well.   To learn more about what you can do with MyChart, go to [email or web address].    Your next appointment:  As Needed    Provider:  Dr.Allexus Ovens        Signed, 4 Mykeal Carrick [place], MD  04/12/2023 4:36 PM    [facility]

## 2023-04-12 ENCOUNTER — Ambulatory Visit: Payer: Medicare Other | Attending: Cardiology | Admitting: Cardiology

## 2023-04-12 ENCOUNTER — Encounter: Payer: Self-pay | Admitting: Cardiology

## 2023-04-12 VITALS — BP 113/72 | HR 118 | Ht 59.0 in | Wt 142.8 lb

## 2023-04-12 DIAGNOSIS — I7121 Aneurysm of the ascending aorta, without rupture: Secondary | ICD-10-CM | POA: Insufficient documentation

## 2023-04-12 DIAGNOSIS — I351 Nonrheumatic aortic (valve) insufficiency: Secondary | ICD-10-CM | POA: Insufficient documentation

## 2023-04-12 NOTE — Patient Instructions (Signed)
Medication Instructions:  Continue same medications  Lab Work: None ordered   Testing/Procedures: None ordered   Follow-Up: At [facility], you and your health needs are our priority.  As part of our continuing mission to provide you with exceptional heart care, we have created designated Provider Care Teams.  These Care Teams include your primary Cardiologist (physician) and Advanced Practice Providers (APPs -  Physician Assistants and Nurse Practitioners) who all work together to provide you with the care you need, when you need it.  We recommend signing up for the patient portal called "MyChart".  Sign up information is provided on this After Visit Summary.  MyChart is used to connect with patients for Virtual Visits (Telemedicine).  Patients are able to view lab/test results, encounter notes, upcoming appointments, etc.  Non-urgent messages can be sent to your provider as well.   To learn more about what you can do with MyChart, go to [email or web address].    Your next appointment:  As Needed    Provider: Dr.Jordan

## 2023-06-03 ENCOUNTER — Other Ambulatory Visit: Payer: Self-pay | Admitting: Medical

## 2023-06-08 ENCOUNTER — Other Ambulatory Visit: Payer: Self-pay | Admitting: Medical

## 2023-06-08 MED ORDER — AMPHETAMINE-DEXTROAMPHETAMINE 10 MG PO TABS: 10.0000 mg | ORAL_TABLET | Freq: Every day | ORAL | 0 refills | Status: DC

## 2023-07-25 ENCOUNTER — Other Ambulatory Visit: Payer: Self-pay | Admitting: Medical

## 2023-07-26 ENCOUNTER — Ambulatory Visit
Admission: RE | Admit: 2023-07-26 | Discharge: 2023-07-26 | Disposition: A | Discharge status: Home / Self Care | Payer: Medicare Other | Source: Ambulatory Visit | Attending: Medical | Admitting: Medical

## 2023-07-26 ENCOUNTER — Ambulatory Visit: Payer: Self-pay | Admitting: Medical

## 2023-07-26 DIAGNOSIS — M8580 Other specified disorders of bone density and structure, unspecified site: Secondary | ICD-10-CM

## 2023-07-26 DIAGNOSIS — Z780 Asymptomatic menopausal state: Secondary | ICD-10-CM

## 2023-07-26 NOTE — Progress Notes (Signed)
 Results sent through MyChart

## 2023-08-16 ENCOUNTER — Ambulatory Visit: Payer: Medicare Other

## 2023-08-16 DIAGNOSIS — Z0000 Encounter for general adult medical examination without abnormal findings: Secondary | ICD-10-CM | POA: Diagnosis not present

## 2023-08-16 NOTE — Progress Notes (Signed)
 Subjective:   Jamie Singh is a 73 y.o. who presents for a Medicare Wellness preventive visit.  As a reminder, Annual Wellness Visits don't include a physical exam, and some assessments may be limited, especially if this visit is performed virtually. We may recommend an in-person follow-up visit with your provider if needed.  Visit Complete: Virtual I connected with  Jamie Singh on 08/16/23 by a audio enabled telemedicine application and verified that I am speaking with the correct person using two identifiers.  Patient Location: Home  Provider Location: Office/Clinic  I discussed the limitations of evaluation and management by telemedicine. The patient expressed understanding and agreed to proceed.  Vital Signs: Because this visit was a virtual/telehealth visit, some criteria may be missing or patient reported. Any vitals not documented were not able to be obtained and vitals that have been documented are patient reported.  VideoError- Librarian, academic were attempted between this provider and patient, however failed, due to patient having technical difficulties OR patient did not have access to video capability.  We continued and completed visit with audio only.   Persons Participating in Visit: Patient.  AWV Questionnaire: Yes: Patient Medicare AWV questionnaire was completed by the patient on 08/15/2023; I have confirmed that all information answered by patient is correct and no changes since this date.  Cardiac Risk Factors include: advanced age (>50men, >15 women)     Objective:    Today's Vitals   There is no height or weight on file to calculate BMI.     08/16/2023   10:11 AM 08/10/2022    9:47 AM 03/06/2021    9:13 AM 08/22/2020   11:51 AM 03/30/2020   10:50 AM 02/11/2020   11:33 AM  Advanced Directives  Does Patient Have a Medical Advance Directive? Yes Yes Yes Yes No Yes  Type of Estate agent of  [place];Living will Healthcare Power of [place];Living will Healthcare Power of [place];Living will Healthcare Power of [place];Living will  Living will;Healthcare Power of Attorney  Does patient want to make changes to medical advance directive?      No - Patient declined  Copy of Healthcare Power of Attorney in Chart? No - copy requested No - copy requested No - copy requested No - copy requested  No - copy requested    Current Medications (verified) Outpatient Encounter Medications as of 08/16/2023  Medication Sig   albuterol  (VENTOLIN  HFA) 108 (90 Base) MCG/ACT inhaler Inhale 2 puffs into the lungs every 6 (six) hours as needed for wheezing or shortness of breath.   amphetamine -dextroamphetamine  (ADDERALL) 10 MG tablet Take 1 tablet (10 mg total) by mouth daily with breakfast.   buPROPion  (WELLBUTRIN ) 75 MG tablet TAKE 1 TABLET DAILY   Calcium -Magnesium 250-125 MG TABS Take 2 tablets by mouth daily.   cholecalciferol (VITAMIN D3) 25 MCG (1000 UNIT) tablet Take 1 tablet (1,000 Units total) by mouth daily.   escitalopram  (LEXAPRO ) 10 MG tablet TAKE 1 TABLET DAILY   famotidine  (PEPCID ) 20 MG tablet TAKE 1 TABLET AT BEDTIME   fluocinonide ointment (LIDEX) 0.05 %    fluticasone  furoate-vilanterol (BREO ELLIPTA ) 100-25 MCG/ACT AEPB Inhale 1 puff into the lungs daily.   levothyroxine  (SYNTHROID ) 88 MCG tablet TAKE 1 TABLET DAILY   PSYLLIUM HUSK PO Take 2,000 mg by mouth daily.   rosuvastatin  (CRESTOR ) 10 MG tablet TAKE 1 TABLET DAILY   valACYclovir  (VALTREX ) 500 MG tablet Take 1 tablet (500 mg total) by mouth daily.  estradiol  (ESTRACE  VAGINAL) 0.1 MG/GM vaginal cream Use small fingertip size amount of cream at the entrance of the vagina at bedtime three times a week for the next two weeks (Patient not taking: Reported on 08/16/2023)   SSD 1 % cream  (Patient not taking: Reported on 08/16/2023)   No facility-administered encounter medications on file as of 08/16/2023.    Allergies  (verified) Pollen extract, Hydrocodone, Oxycodone , and Quinolones   History: Past Medical History:  Diagnosis Date   ADHD    Allergy    Anxiety and depression    Aortic aneurysm (HCC)    Asthma    mild, but prior on preventative FLovent   Endometrial cancer (HCC) 2020   Genital herpes    GERD (gastroesophageal reflux disease)    Hyperlipidemia    Hypothyroidism    Osteoporosis    Thoracic aortic aneurysm (HCC)    Wears hearing aid    Past Surgical History:  Procedure Laterality Date   ABDOMINAL HYSTERECTOMY     COLONOSCOPY  2019   ESOPHAGOGASTRODUODENOSCOPY ENDOSCOPY     ROBOTIC ASSISTED TOTAL HYSTERECTOMY WITH BILATERAL SALPINGO OOPHERECTOMY     RA-TLH/BSO   TONSILLECTOMY AND ADENOIDECTOMY     WRIST FRACTURE SURGERY Right    Family History  Problem Relation Age of Onset   COPD Mother    Heart failure Mother    Macular degeneration Mother    Osteoporosis Mother    Cancer Mother        Mom needed a hysterectomy, thinks that she may have had uterine cancer   Heart disease Father        MI   COPD Father    Aortic aneurysm Father    Heart disease Brother        MI   Myelodysplastic syndrome Brother    Osteoporosis Maternal Grandmother    Heart failure Maternal Grandmother    Heart failure Maternal Grandfather    COPD Maternal Grandfather    Stroke Paternal Grandfather    Breast cancer Neg Hx    Colon cancer Neg Hx    Colon polyps Neg Hx    Esophageal cancer Neg Hx    Rectal cancer Neg Hx    Stomach cancer Neg Hx    Social History   Socioeconomic History   Marital status: Married    Spouse name: Not on file   Number of children: Not on file   Years of education: Not on file   Highest education level: Bachelor's degree (e.g., BA, AB, BS)  Occupational History   Occupation: Hydrographic surveyor - retired  Tobacco Use   Smoking status: Never   Smokeless tobacco: Never  Vaping Use   Vaping status: Never Used  Substance and Sexual Activity   Alcohol use: Yes     Alcohol/week: 2.0 - 3.0 standard drinks of alcohol    Types: 2 - 3 Glasses of wine per week    Comment: 2-4 times monthly   Drug use: Yes    Frequency: 2.0 times per week    Types: Marijuana    Comment: gummies   Sexual activity: Not Currently    Birth control/protection: Post-menopausal  Other Topics Concern   Not on file  Social History Narrative   Lives with husband and dog.  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   Social Drivers of Health   Financial Resource Strain: Low Risk  (08/15/2023)   Overall Financial Resource Strain (CARDIA)    Difficulty of Paying Living Expenses: Not hard at all  Food Insecurity: No Food Insecurity (08/15/2023)   Hunger Vital Sign    Worried About Running Out of Food in the Last Year: Never true    Ran Out of Food in the Last Year: Never true  Transportation Needs: No Transportation Needs (08/15/2023)   PRAPARE - Administrator, Civil Service (Medical): No    Lack of Transportation (Non-Medical): No  Physical Activity: Sufficiently Active (08/15/2023)   Exercise Vital Sign    Days of Exercise per Week: 5 days    Minutes of Exercise per Session: 60 min  Stress: Stress Concern Present (08/15/2023)   [employer] of Occupational Health - Occupational Stress Questionnaire    Feeling of Stress: Rather much  Social Connections: Moderately Integrated (08/15/2023)   Social Connection and Isolation Panel    Frequency of Communication with Friends and Family: More than three times a week    Frequency of Social Gatherings with Friends and Family: More than three times a week    Attends Religious Services: Never    Database administrator or Organizations: Yes    Attends Engineer, structural: More than 4 times per year    Marital Status: Married    Tobacco Counseling Counseling given: Not Answered    Clinical Intake:  Pre-visit preparation completed: Yes  Pain : No/denies pain     Nutritional Risks: None Diabetes:  No  Lab Results  Component Value Date   HGBA1C 5.8 (H) 11/05/2022   HGBA1C 5.7 (H) 10/30/2021     How often do you need to have someone help you when you read instructions, pamphlets, or other written materials from your doctor or pharmacy?: 1 - Never  Interpreter Needed?: No  Information entered by :: NAllen LPN   Activities of Daily Living     08/15/2023    9:44 AM  In your present state of health, do you have any difficulty performing the following activities:  Hearing? 0  Vision? 0  Difficulty concentrating or making decisions? 0  Walking or climbing stairs? 0  Dressing or bathing? 0  Doing errands, shopping? 0  Preparing Food and eating ? N  Using the Toilet? N  In the past six months, have you accidently leaked urine? Y  Do you have problems with loss of bowel control? N  Managing your Medications? N  Managing your Finances? N  Housekeeping or managing your Housekeeping? N    Patient Care Team: Tysinger, Christiane Cowing, PA-C as PCP - General (Family Medicine)  I have updated your Care Teams any recent Medical Services you may have received from other providers in the past year.     Assessment:   This is a routine wellness examination for Jamie Singh.  Hearing/Vision screen Hearing Screening - Comments:: Denies hearing issue Vision Screening - Comments:: Regular eye exams, [facility], Dr. Johnetta Nab   Goals Addressed             This Visit's Progress    Patient Stated       08/16/2023, continue to build strength and stamina       Depression Screen     08/16/2023   10:12 AM 08/10/2022    9:48 AM 06/02/2021    1:50 PM 03/06/2021    9:15 AM 12/09/2020    9:55 AM 08/20/2020   10:03 AM 02/20/2020    1:48 PM  PHQ 2/9 Scores  PHQ - 2 Score 0 0 0 0 0 0 2  PHQ- 9 Score 0 0  7    Fall Risk     08/15/2023    9:44 AM 08/07/2022    7:40 AM 06/02/2021    1:49 PM 03/06/2021    9:14 AM 03/05/2021    4:02 PM  Fall Risk   Falls in the past year? 0 0 0 1 1  Comment    slipped    Number falls in past yr: 0 0 0 1 0  Injury with Fall? 0 0 0 1 1  Comment    broke shoulder   Risk for fall due to : Medication side effect Medication side effect No Fall Risks Medication side effect   Follow up Falls prevention discussed;Falls evaluation completed Falls prevention discussed;Education provided;Falls evaluation completed Falls evaluation completed  Falls evaluation completed;Education provided;Falls prevention discussed       Data saved with a previous flowsheet row definition    MEDICARE RISK AT HOME:  Medicare Risk at Home Any stairs in or around the home?: (Patient-Rptd) Yes If so, are there any without handrails?: (Patient-Rptd) No Home free of loose throw rugs in walkways, pet beds, electrical cords, etc?: (Patient-Rptd) Yes Adequate lighting in your home to reduce risk of falls?: (Patient-Rptd) Yes Life alert?: (Patient-Rptd) No Use of a cane, walker or w/c?: (Patient-Rptd) No Grab bars in the bathroom?: (Patient-Rptd) No Shower chair or bench in shower?: (Patient-Rptd) No Elevated toilet seat or a handicapped toilet?: (Patient-Rptd) No  TIMED UP AND GO:  Was the test performed?  No  Cognitive Function: 6CIT completed        08/16/2023   10:13 AM 08/10/2022    9:50 AM 03/06/2021    9:17 AM  6CIT Screen  What Year? 0 points 0 points 0 points  What month? 0 points 0 points 0 points  What time? 0 points 0 points 0 points  Count back from 20 0 points 0 points 0 points  Months in reverse 0 points 0 points 0 points  Repeat phrase 0 points 0 points 2 points  Total Score 0 points 0 points 2 points    Immunizations Immunization History  Administered Date(s) Administered   Fluad Trivalent(High Dose 65+) 11/05/2022   Influenza, High Dose Seasonal PF 11/28/2019, 11/26/2020   Influenza-Unspecified 12/21/2016, 12/01/2018, 10/30/2021   PFIZER(Purple Top)SARS-COV-2 Vaccination 03/24/2019, 04/16/2019, 12/12/2019   PNEUMOCOCCAL CONJUGATE-20 03/06/2021   Pfizer  Covid-19 Vaccine Bivalent Booster 33yrs & up 11/26/2020   Pneumococcal Conjugate-13 02/06/2018   Tdap 06/02/2021   Zoster Recombinant(Shingrix) 12/01/2018, 05/08/2019    Screening Tests Health Maintenance  Topic Date Due   COVID-19 Vaccine (5 - 2024-25 season) 10/31/2022   INFLUENZA VACCINE  09/30/2023   Medicare Annual Wellness (AWV)  08/15/2024   MAMMOGRAM  01/12/2025   Colonoscopy  12/31/2028   DTaP/Tdap/Td (2 - Td or Tdap) 06/03/2031   Pneumococcal Vaccine: 50+ Years  Completed   DEXA SCAN  Completed   Hepatitis C Screening  Completed   Zoster Vaccines- Shingrix  Completed   HPV VACCINES  Aged Out   Meningococcal B Vaccine  Aged Out    Health Maintenance  Health Maintenance Due  Topic Date Due   COVID-19 Vaccine (5 - 2024-25 season) 10/31/2022   Health Maintenance Items Addressed: Up to date  Additional Screening:  Vision Screening: Recommended annual ophthalmology exams for early detection of glaucoma and other disorders of the eye. Would you like a referral to an eye doctor? No    Dental Screening: Recommended annual dental exams for proper oral hygiene  Community Resource Referral /  Chronic Care Management: CRR required this visit?  No   CCM required this visit?  No   Plan:    I have personally reviewed and noted the following in the patient's chart:   Medical and social history Use of alcohol, tobacco or illicit drugs  Current medications and supplements including opioid prescriptions. Patient is not currently taking opioid prescriptions. Functional ability and status Nutritional status Physical activity Advanced directives List of other physicians Hospitalizations, surgeries, and ER visits in previous 12 months Vitals Screenings to include cognitive, depression, and falls Referrals and appointments  In addition, I have reviewed and discussed with patient certain preventive protocols, quality metrics, and best practice recommendations. A written  personalized care plan for preventive services as well as general preventive health recommendations were provided to patient.   Areatha Beecham, LPN   06/07/8117   After Visit Summary: (MyChart) Due to this being a telephonic visit, the after visit summary with patients personalized plan was offered to patient via MyChart   Notes: Nothing significant to report at this time.

## 2023-08-16 NOTE — Patient Instructions (Signed)
 Jamie Singh , Thank you for taking time out of your busy schedule to complete your Annual Wellness Visit with me. I enjoyed our conversation and look forward to speaking with you again next year. I, as well as your care team,  appreciate your ongoing commitment to your health goals. Please review the following plan we discussed and let me know if I can assist you in the future. Your Game plan/ To Do List    Referrals: If you haven't heard from the office you've been referred to, please reach out to them at the phone provided.  N/a Follow up Visits: Next Medicare AWV with our clinical staff: 08/28/2024 at 10:10   Have you seen your provider in the last 6 months (3 months if uncontrolled diabetes)? No Next Office Visit with your provider: 11/15/2023 at 8:15  Clinician Recommendations:  Aim for 30 minutes of exercise or brisk walking, 6-8 glasses of water, and 5 servings of fruits and vegetables each day.       This is a list of the screening recommended for you and due dates:  Health Maintenance  Topic Date Due   COVID-19 Vaccine (5 - 2024-25 season) 10/31/2022   Flu Shot  09/30/2023   Medicare Annual Wellness Visit  08/15/2024   Mammogram  01/12/2025   Colon Cancer Screening  12/31/2028   DTaP/Tdap/Td vaccine (2 - Td or Tdap) 06/03/2031   Pneumococcal Vaccine for age over 110  Completed   DEXA scan (bone density measurement)  Completed   Hepatitis C Screening  Completed   Zoster (Shingles) Vaccine  Completed   HPV Vaccine  Aged Out   Meningitis B Vaccine  Aged Out    Advanced directives: (Copy Requested) Please bring a copy of your health care power of attorney and living will to the office to be added to your chart at your convenience. You can mail to [facility] [address] [place], [place] [address] or email to [email or web address] Advance Care Planning is important because it:  [x]  Makes sure you receive the medical care that is consistent with your values,  goals, and preferences  [x]  It provides guidance to your family and loved ones and reduces their decisional burden about whether or not they are making the right decisions based on your wishes.  Follow the link provided in your after visit summary or read over the paperwork we have mailed to you to help you started getting your Advance Directives in place. If you need assistance in completing these, please reach out to us  so that we can help you!  See attachments for Preventive Care and Fall Prevention Tips.

## 2023-08-27 ENCOUNTER — Encounter: Payer: Self-pay | Admitting: Gynecologic Oncology

## 2023-09-14 ENCOUNTER — Other Ambulatory Visit: Payer: Self-pay | Admitting: Medical

## 2023-09-14 MED ORDER — AMPHETAMINE-DEXTROAMPHETAMINE 10 MG PO TABS: 10.0000 mg | ORAL_TABLET | Freq: Every day | ORAL | 0 refills | Status: DC

## 2023-11-03 ENCOUNTER — Ambulatory Visit (INDEPENDENT_AMBULATORY_CARE_PROVIDER_SITE_OTHER): Admitting: Orthopedic Surgery

## 2023-11-03 ENCOUNTER — Other Ambulatory Visit: Payer: Self-pay

## 2023-11-03 ENCOUNTER — Encounter: Payer: Self-pay | Admitting: Orthopedic Surgery

## 2023-11-03 DIAGNOSIS — M6701 Short Achilles tendon (acquired), right ankle: Secondary | ICD-10-CM | POA: Diagnosis not present

## 2023-11-03 DIAGNOSIS — M2022 Hallux rigidus, left foot: Secondary | ICD-10-CM

## 2023-11-03 DIAGNOSIS — M6702 Short Achilles tendon (acquired), left ankle: Secondary | ICD-10-CM

## 2023-11-03 DIAGNOSIS — M2021 Hallux rigidus, right foot: Secondary | ICD-10-CM | POA: Diagnosis not present

## 2023-11-03 DIAGNOSIS — M79672 Pain in left foot: Secondary | ICD-10-CM | POA: Diagnosis not present

## 2023-11-03 DIAGNOSIS — M79671 Pain in right foot: Secondary | ICD-10-CM | POA: Diagnosis not present

## 2023-11-03 NOTE — Progress Notes (Signed)
 Office Visit Note   Patient: Jamie Singh           Date of Birth: 12-Sep-1950           MRN: [id number] Visit Date: 11/03/2023              Requested by: Bulah Alm RAMAN, PA-C [address] [place],  [place] [address] PCP: Bulah Alm RAMAN, PA-C  Chief Complaint  Patient presents with   Right Foot - Pain   Left Foot - Pain      HPI: Discussed the use of AI scribe software for clinical note transcription with the patient, who gave verbal consent to proceed.  History of Present Illness Jamie Singh is a 73 year old female with osteoarthritis who presents with foot pain and difficulty walking.  She experiences pain in both feet, particularly in the great toe MTP joint, which she attributes to arthritis. The pain originates from inside the joint and is exacerbated by activities such as walking and standing for extended periods. After a three-mile walk completed in 60 minutes, she felt discomfort two-thirds of the way through, which intensified while standing in line post-walk.  She has a tight Achilles tendon, contributing to overloading the arthritic big toe. Limited dorsiflexion of the great toe causes pain during certain exercises, such as yoga poses like the downward dog. She participates in a body balance class that includes yoga, Pilates, and Tai Chi, and has noticed increased discomfort during the yoga portion. She also reports soreness in her shins following long walks, which she expects to resolve over time.  She prefers to be barefoot at home and dislikes wearing shoes, although she acknowledges that certain activities may require supportive footwear. A friend suggested she might have a neuroma.     Assessment & Plan: Visit Diagnoses:  1. 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 Chronic condition with limited dorsiflexion of approximately thirty degrees. Pain primarily due to arthritis, not a bunion, with joint overload from limited motion. Surgery involving joint fusion is not preferred by her. - Recommend wearing stiff shoes that do not bend easily. - Advise purchasing a carbon fiber insole to stiffen shoes and reduce pressure on the big toe. - Encourage continued exercise with appropriate footwear to minimize discomfort.  Bilateral tight Achilles tendons contributing to forefoot overload Bilateral tight Achilles tendons contribute to forefoot overload, particularly affecting the arthritic big toe. Tightness limits ankle dorsiflexion to about twenty degrees short of neutral. - Instruct on Achilles tendon stretching exercises: perform with shoes on, hold for about a minute, three times a day. - Advise to feel a pull all the way to the back of the knee during stretching and to push heels lower as the stretch eases.      Follow-Up Instructions: No follow-ups on file.   Ortho Exam  Patient is alert, oriented, no adenopathy, well-dressed, normal affect, normal respiratory effort. Physical Exam EXTREMITIES: Dorsalis pedis pulse good bilaterally. MUSCULOSKELETAL: Great toe MTP joint dorsiflexion 30 degrees with palpable bony spurs circumferentially. Pain exacerbated with dorsiflexion of the great toe. Maximum tenderness at great toe MTP joint with activities. Ankle dorsiflexion with knee extended 20 degrees short of neutral.      Imaging: No results found. No images are  attached to the encounter.  Labs: Lab Results  Component Value Date   HGBA1C 5.8 (H) 11/05/2022   HGBA1C 5.7 (H) 10/30/2021   REPTSTATUS 08/05/2022 FINAL 08/03/2022   GRAMSTAIN  08/03/2022    NO WBC SEEN FEW GRAM POSITIVE COCCI RARE GRAM NEGATIVE RODS Performed at [facility], [address], [place], [place] [address]     CULT RARE ESCHERICHIA COLI 08/03/2022   LABORGA ESCHERICHIA COLI 08/03/2022     Lab Results  Component Value Date   ALBUMIN 4.5 11/05/2022   ALBUMIN 4.5 10/30/2021   ALBUMIN 4.3 10/18/2019    No results found for: MG Lab Results  Component Value Date   VD25OH 55.9 11/05/2022   VD25OH 40.1 10/30/2021   VD25OH 29.4 (L) 08/20/2020    No results found for: PREALBUMIN    Latest Ref Rng & Units 02/07/2023    3:05 PM 11/05/2022   10:46 AM 10/30/2021   10:22 AM  CBC EXTENDED  WBC 4.0 - 10.5 K/uL 8.3  7.4  6.6   RBC 3.87 - 5.11 MIL/uL 4.43  4.62  4.49   Hemoglobin 12.0 - 15.0 g/dL 87.9  88.1  86.8   HCT 36.0 - 46.0 % 36.8  38.2  40.7   Platelets 150 - 400 K/uL 291  306  245   NEUT# 1.4 - 7.0 x10E3/uL   4.4   Lymph# 0.7 - 3.1 x10E3/uL   1.4      There is no height or weight on file to calculate BMI.  Orders:  Orders Placed This Encounter  Procedures   XR Foot Complete Left   XR Foot Complete Right   No orders of the defined types were placed in this encounter.    Procedures: No procedures performed  Clinical Data: No additional findings.  ROS:  All other systems negative, except as noted in the HPI. Review of Systems  Objective: Vital Signs: There were no vitals taken for this visit.  Specialty Comments:  No specialty comments available.  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   Pure hypercholesterolemia 08/20/2020   Vitamin D  deficiency, unspecified  08/20/2020   Urinary frequency 08/20/2020   Aortic valve regurgitation 05/19/2020   Gastroesophageal reflux disease 05/19/2020   Encounter for health maintenance examination in adult 02/20/2020   Uses hearing aid 02/20/2020   Vaccine  counseling 02/20/2020   Post-menopausal 02/20/2020   Estrogen deficiency 02/20/2020   Snoring 02/20/2020   History of eating disorder 02/20/2020   History of endometrial cancer 10/18/2019   Thoracic aortic aneurysm without rupture (HCC) 10/18/2019   S/P hysterectomy 10/18/2019   Excessive sweating 10/18/2019   Hypothyroidism 10/18/2019   Past Medical History:  Diagnosis Date   ADHD    Allergy    Anxiety and depression    Aortic aneurysm (HCC)    Asthma    mild, but prior on preventative FLovent   Endometrial cancer (HCC) 2020   Genital herpes    GERD (gastroesophageal reflux disease)    Hyperlipidemia    Hypothyroidism    Osteoporosis    Thoracic aortic aneurysm (HCC)    Wears hearing aid     Family History  Problem Relation Age of Onset   COPD Mother  Heart failure Mother    Macular degeneration Mother    Osteoporosis Mother    Cancer Mother        Mom needed a hysterectomy, thinks that she may have had uterine cancer   Heart disease Father        MI   COPD Father    Aortic aneurysm Father    Heart disease Brother        MI   Myelodysplastic syndrome Brother    Osteoporosis Maternal Grandmother    Heart failure Maternal Grandmother    Heart failure Maternal Grandfather    COPD Maternal Grandfather    Stroke Paternal Grandfather    Breast cancer Neg Hx    Colon cancer Neg Hx    Colon polyps Neg Hx    Esophageal cancer Neg Hx    Rectal cancer Neg Hx    Stomach cancer Neg Hx     Past Surgical History:  Procedure Laterality Date   ABDOMINAL HYSTERECTOMY     COLONOSCOPY  2019   ESOPHAGOGASTRODUODENOSCOPY ENDOSCOPY     ROBOTIC ASSISTED TOTAL HYSTERECTOMY WITH BILATERAL SALPINGO OOPHERECTOMY     RA-TLH/BSO   TONSILLECTOMY AND ADENOIDECTOMY     WRIST FRACTURE SURGERY Right    Social History   Occupational History   Occupation: Hydrographic surveyor - retired  Tobacco Use   Smoking status: Never   Smokeless tobacco: Never  Vaping Use   Vaping status: Never Used   Substance and Sexual Activity   Alcohol use: Yes    Alcohol/week: 2.0 - 3.0 standard drinks of alcohol    Types: 2 - 3 Glasses of wine per week    Comment: 2-4 times monthly   Drug use: Yes    Frequency: 2.0 times per week    Types: Marijuana    Comment: gummies   Sexual activity: Not Currently    Birth control/protection: Post-menopausal

## 2023-11-15 ENCOUNTER — Ambulatory Visit (INDEPENDENT_AMBULATORY_CARE_PROVIDER_SITE_OTHER): Payer: Medicare Other | Admitting: Medical

## 2023-11-15 VITALS — BP 120/80 | HR 66 | Ht 58.75 in | Wt 141.2 lb

## 2023-11-15 DIAGNOSIS — F909 Attention-deficit hyperactivity disorder, unspecified type: Secondary | ICD-10-CM

## 2023-11-15 DIAGNOSIS — Z974 Presence of external hearing-aid: Secondary | ICD-10-CM | POA: Diagnosis not present

## 2023-11-15 DIAGNOSIS — J4540 Moderate persistent asthma, uncomplicated: Secondary | ICD-10-CM | POA: Insufficient documentation

## 2023-11-15 DIAGNOSIS — F419 Anxiety disorder, unspecified: Secondary | ICD-10-CM

## 2023-11-15 DIAGNOSIS — F32A Depression, unspecified: Secondary | ICD-10-CM

## 2023-11-15 DIAGNOSIS — Z23 Encounter for immunization: Secondary | ICD-10-CM

## 2023-11-15 DIAGNOSIS — Z0000 Encounter for general adult medical examination without abnormal findings: Secondary | ICD-10-CM

## 2023-11-15 DIAGNOSIS — Z8542 Personal history of malignant neoplasm of other parts of uterus: Secondary | ICD-10-CM

## 2023-11-15 DIAGNOSIS — K219 Gastro-esophageal reflux disease without esophagitis: Secondary | ICD-10-CM

## 2023-11-15 DIAGNOSIS — E7800 Pure hypercholesterolemia, unspecified: Secondary | ICD-10-CM

## 2023-11-15 DIAGNOSIS — E559 Vitamin D deficiency, unspecified: Secondary | ICD-10-CM | POA: Diagnosis not present

## 2023-11-15 DIAGNOSIS — Z79899 Other long term (current) drug therapy: Secondary | ICD-10-CM

## 2023-11-15 DIAGNOSIS — Z131 Encounter for screening for diabetes mellitus: Secondary | ICD-10-CM

## 2023-11-15 DIAGNOSIS — I712 Thoracic aortic aneurysm, without rupture, unspecified: Secondary | ICD-10-CM

## 2023-11-15 DIAGNOSIS — M810 Age-related osteoporosis without current pathological fracture: Secondary | ICD-10-CM

## 2023-11-15 DIAGNOSIS — L308 Other specified dermatitis: Secondary | ICD-10-CM | POA: Insufficient documentation

## 2023-11-15 DIAGNOSIS — E039 Hypothyroidism, unspecified: Secondary | ICD-10-CM

## 2023-11-15 LAB — LIPID PANEL

## 2023-11-15 MED ORDER — ACYCLOVIR 5 % EX OINT: 1.0000 | TOPICAL_OINTMENT | CUTANEOUS | 1 refills | Status: AC

## 2023-11-15 MED ORDER — VALACYCLOVIR HCL 500 MG PO TABS: 500.0000 mg | ORAL_TABLET | Freq: Two times a day (BID) | ORAL | 0 refills | Status: AC

## 2023-11-15 NOTE — Patient Instructions (Signed)
 Call and check insurance to see what the preferred maintenance inhaler is on your insurance plan.  Choices include the following: Breo Symbicort Advair Dulera Breztri   Consider medication to help slow down bone loss for osteopenia.  Your FRAX score or fracture risk is increased compared to the 2022 study.  Also ask you dentist about their opinion for these options.  Options include the following: Fosamax weekly oral tablet Boniva monthly oral tablet Yearly IV infusion called Reclast Prolia every 29-month injection  Other options are listed below      Taking medications to treat osteoporosis reduces fracture risk by 50-70%!   1 in 4 American women and 1 in 1 men over age 38 years old have osteoporosis.    Recommendations:   Regular exercise including aerobic and weightbearing exercise, vitamin D  supplementation, limiting alcohol and not smoking.    We generally recommend adding a medication at this point to reduce your risk of fracture by reducing the rate of bone turnover or helping to increase bone mass.  Examples would be Fosamax weekly oral medication, Prolia injection every 6 months, or other options.   I want you to consider the options below, then let me know what you may want to do.   All of the medications have potential risks, but they all have the benefit of slowing down the rate of bone turnover as we get older, helping to reduce the risk of a bone fracture.  Options for therapy:   Bisphosphonate such as alendronate (Fosamax) This is a medication given weekly that strengthens bones by slowing down the rate of bone loss.  This is usually the first-line medication given the lower cost and does not require injection.  Many people tolerate this medication but some do not.  This medicine has to be taken weekly first thing in the morning on empty stomach, and you cannot lie down for an hour after taking the medication.  Risks of the medication includes trouble swallowing the  medication, inflammation of the esophagus, gastric ulcers, and rare risk of breakdown of the jawbone.  These medications are usually given for 5 years (3 years if injectable bisphosphonate like Reclast).   Evenity  Compared with bisphosphonates, this type of medication called monoclonal antibodies produce similar or better bone density results and reduces the chance of all types of fractures. Evenity is delivered via a shot under the skin every month.   Recent research indicates there could be a high risk of spinal column fractures after stopping the drug.  A very rare complication of bisphosphonates and denosumab is a break or crack in the middle of the thighbone.  A second rare complication is delayed healing of the jawbone (osteonecrosis of the jaw). This can occur after an invasive dental procedure such as removing a tooth.   Forteo This medication increases bone density and strength, it is a synthetic version of the parathyroid hormone, and it is given as a daily injection for 2 years.  Risk include leg cramps, nausea, dizziness, elevated calcium , joint pain, and rare risk of bone cancer in animal studies.  Prolia This is an injection given twice yearly that prevents bone dissolving osteoclast cells from forming.  Its a good option for women who can't tolerate bisphosphonate.  The advantages not having to take something every day or every month and it is well-tolerated for the most part.  This type of medication is called a monoclonal antibody, so there are risk of low blood calcium , skin infections, rash,  and rarer potential risks are breakdown or death of jawbone, and rare risk of fracture of the thigh bone.  This medication is given ongoing.  Calcitonin  This is an old drug that helps prevent bone loss, used as a daily nasal spray or injection.  It  reduces spinal fractures, but is not effective in other types of fractures so it is usually not the first-line option.  Side effects can include  flushing, rash.  There is a small increase in cancer risk with this medication.  Evista This is a selective estrogen receptor modulator (SERM), and is used in breast cancer prevention and treatment.  It is also used in treatment of osteoporosis.   It is used in women to reduce risk of vertebral fractures.  Side effects include hot flashes, muscle pain, and increased risks of blood clots in the leg.

## 2023-11-15 NOTE — Progress Notes (Signed)
 Subjective:   HPI  Jamie Singh is a 73 y.o. female who presents for Chief Complaint  Patient presents with   Annual Exam    Fasting cpe, thyroid and fingernails    Patient Care Team: Finch Costanzo, Alm GORMAN RIGGERS as PCP - General (Family Medicine) Dr. Ricka Stake, dermatology Cardiology - [facility] Local cardiology Dr. Peter [place] Dr. Comer Dollar, gyn onc Dr. Jerona Sage, ortho Dr. Rosario Kidney, gastro Dentist Eye doctor Also gynecology dermatology specialist in [place]   Concerns: Here for well visit  Compliant with thyroid medicine as usual  Compliant with cholesterol medicine without complaint  Mood has been okay.  She continues on Lexapro  10 mg daily and Wellbutrin  75 mg daily  She continues on Adderall 10 mg daily for attention and focus  She has been dealing with a recurrent vaginal itching and irritation.  She ended up seeing gynecology dermatology and was referred to a gynecology dermatopathology specialist and put on halobetasol ointment for an autoimmune type irritation.  It has improved things quite a bit but still not completely back to normal  She would like refill of Valtrex  tablets and acyclovir  ointment that she uses for flareups of herpes.  She needs to be on a daily maintenance but Ranell is too expensive.  She is using her albuterol  about every other day     Reviewed their medical, surgical, family, social, medication, and allergy history and updated chart as appropriate.  Allergies  Allergen Reactions   Pollen Extract Hives   Hydrocodone     Itching and dizziness   Oxycodone      Chills, dizziness, nausea.    Quinolones Other (See Comments)    Fluroquinolone antibiotics should be avoided in patients with aortic disease unless alternative therapy is not an option.    Past Medical History:  Diagnosis Date   ADHD    Allergy    Anxiety and depression    Aortic aneurysm (HCC)    Asthma    mild, but prior on  preventative FLovent   Endometrial cancer (HCC) 2020   Genital herpes    GERD (gastroesophageal reflux disease)    Hyperlipidemia    Hypothyroidism    Osteoporosis    Thoracic aortic aneurysm (HCC)    Wears hearing aid     Current Outpatient Medications on File Prior to Visit  Medication Sig Dispense Refill   albuterol  (VENTOLIN  HFA) 108 (90 Base) MCG/ACT inhaler Inhale 2 puffs into the lungs every 6 (six) hours as needed for wheezing or shortness of breath. 3 each 0   amphetamine -dextroamphetamine  (ADDERALL) 10 MG tablet Take 1 tablet (10 mg total) by mouth daily with breakfast. 90 tablet 0   buPROPion  (WELLBUTRIN ) 75 MG tablet TAKE 1 TABLET DAILY 90 tablet 1   Calcium -Magnesium 250-125 MG TABS Take 2 tablets by mouth daily.     cholecalciferol (VITAMIN D3) 25 MCG (1000 UNIT) tablet Take 1 tablet (1,000 Units total) by mouth daily. 90 tablet 2   escitalopram  (LEXAPRO ) 10 MG tablet TAKE 1 TABLET DAILY 90 tablet 2   estradiol  (ESTRACE  VAGINAL) 0.1 MG/GM vaginal cream Use small fingertip size amount of cream at the entrance of the vagina at bedtime three times a week for the next two weeks 42.5 g 0   famotidine  (PEPCID ) 20 MG tablet TAKE 1 TABLET AT BEDTIME 90 tablet 2   fluticasone  furoate-vilanterol (BREO ELLIPTA ) 100-25 MCG/ACT AEPB Inhale 1 puff into the lungs daily. 90 each 3   levothyroxine  (SYNTHROID ) 88 MCG tablet  TAKE 1 TABLET DAILY 90 tablet 1   PSYLLIUM HUSK PO Take 2,000 mg by mouth daily.     rosuvastatin  (CRESTOR ) 10 MG tablet TAKE 1 TABLET DAILY 90 tablet 2   halobetasol (ULTRAVATE) 0.05 % ointment Apply topically.     No current facility-administered medications on file prior to visit.      Current Outpatient Medications:    albuterol  (VENTOLIN  HFA) 108 (90 Base) MCG/ACT inhaler, Inhale 2 puffs into the lungs every 6 (six) hours as needed for wheezing or shortness of breath., Disp: 3 each, Rfl: 0   amphetamine -dextroamphetamine  (ADDERALL) 10 MG tablet, Take 1 tablet  (10 mg total) by mouth daily with breakfast., Disp: 90 tablet, Rfl: 0   buPROPion  (WELLBUTRIN ) 75 MG tablet, TAKE 1 TABLET DAILY, Disp: 90 tablet, Rfl: 1   Calcium -Magnesium 250-125 MG TABS, Take 2 tablets by mouth daily., Disp: , Rfl:    cholecalciferol (VITAMIN D3) 25 MCG (1000 UNIT) tablet, Take 1 tablet (1,000 Units total) by mouth daily., Disp: 90 tablet, Rfl: 2   escitalopram  (LEXAPRO ) 10 MG tablet, TAKE 1 TABLET DAILY, Disp: 90 tablet, Rfl: 2   estradiol  (ESTRACE  VAGINAL) 0.1 MG/GM vaginal cream, Use small fingertip size amount of cream at the entrance of the vagina at bedtime three times a week for the next two weeks, Disp: 42.5 g, Rfl: 0   famotidine  (PEPCID ) 20 MG tablet, TAKE 1 TABLET AT BEDTIME, Disp: 90 tablet, Rfl: 2   fluticasone  furoate-vilanterol (BREO ELLIPTA ) 100-25 MCG/ACT AEPB, Inhale 1 puff into the lungs daily., Disp: 90 each, Rfl: 3   levothyroxine  (SYNTHROID ) 88 MCG tablet, TAKE 1 TABLET DAILY, Disp: 90 tablet, Rfl: 1   PSYLLIUM HUSK PO, Take 2,000 mg by mouth daily., Disp: , Rfl:    rosuvastatin  (CRESTOR ) 10 MG tablet, TAKE 1 TABLET DAILY, Disp: 90 tablet, Rfl: 2   acyclovir  ointment (ZOVIRAX ) 5 %, Apply 1 Application topically every 3 (three) hours., Disp: 15 g, Rfl: 1   halobetasol (ULTRAVATE) 0.05 % ointment, Apply topically., Disp: , Rfl:    valACYclovir  (VALTREX ) 500 MG tablet, Take 1 tablet (500 mg total) by mouth 2 (two) times daily. X 3 days for flare up, Disp: 90 tablet, Rfl: 0  Family History  Problem Relation Age of Onset   COPD Mother    Heart failure Mother    Macular degeneration Mother    Osteoporosis Mother    Cancer Mother        Mom needed a hysterectomy, thinks that she may have had uterine cancer   Heart disease Father        MI   COPD Father    Aortic aneurysm Father    Heart disease Brother        MI   Myelodysplastic syndrome Brother    Osteoporosis Maternal Grandmother    Heart failure Maternal Grandmother    Heart failure Maternal  Grandfather    COPD Maternal Grandfather    Stroke Paternal Grandfather    Breast cancer Neg Hx    Colon cancer Neg Hx    Colon polyps Neg Hx    Esophageal cancer Neg Hx    Rectal cancer Neg Hx    Stomach cancer Neg Hx     Past Surgical History:  Procedure Laterality Date   ABDOMINAL HYSTERECTOMY     COLONOSCOPY  2019   ESOPHAGOGASTRODUODENOSCOPY ENDOSCOPY     ROBOTIC ASSISTED TOTAL HYSTERECTOMY WITH BILATERAL SALPINGO OOPHERECTOMY     RA-TLH/BSO   TONSILLECTOMY AND ADENOIDECTOMY  WRIST FRACTURE SURGERY Right       Review of Systems  Constitutional:  Negative for chills, fever, malaise/fatigue and weight loss.  HENT:  Negative for congestion, ear pain, hearing loss, sore throat and tinnitus.   Eyes:  Negative for blurred vision, pain and redness.  Respiratory:  Negative for cough, hemoptysis and shortness of breath.   Cardiovascular:  Negative for chest pain, palpitations, orthopnea, claudication and leg swelling.  Gastrointestinal:  Negative for abdominal pain, blood in stool, constipation, diarrhea, nausea and vomiting.  Genitourinary:  Negative for dysuria, flank pain, frequency, hematuria and urgency.       Vaginal itching chronic, seeing subspecialist  Musculoskeletal:  Negative for falls, joint pain and myalgias.  Skin:  Negative for itching and rash.  Neurological:  Negative for dizziness, tingling, speech change, weakness and headaches.  Endo/Heme/Allergies:  Negative for polydipsia. Does not bruise/bleed easily.  Psychiatric/Behavioral:  Negative for depression and memory loss. The patient is not nervous/anxious and does not have insomnia.         Objective:  BP 120/80   Pulse 66   Ht 4' 10.75 (1.492 m)   Wt 141 lb 3.2 oz (64 kg)   SpO2 (!) 9%   BMI 28.76 kg/m   General appearance: alert, no distress, WD/WN, Caucasian female Skin: unremarkable HEENT: normocephalic, conjunctiva/corneas normal, sclerae anicteric, PERRLA, EOMi, nares patent, no discharge  or erythema, pharynx normal Oral cavity: MMM, tongue normal, teeth in good repair Neck: supple, no lymphadenopathy, no thyromegaly, no masses, normal ROM, no bruits Chest: non tender, normal shape and expansion Heart: RRR, normal S1, S2, no murmurs Lungs: CTA bilaterally, no wheezes, rhonchi, or rales Abdomen: +bs, soft, non tender, non distended, no masses, no hepatomegaly, no splenomegaly, no bruits Back: non tender, normal ROM, no scoliosis Musculoskeletal: upper extremities non tender, no obvious deformity, normal ROM throughout, lower extremities non tender, no obvious deformity, normal ROM throughout Extremities: no edema, no cyanosis, no clubbing Pulses: 2+ symmetric, upper and lower extremities, normal cap refill Neurological: alert, oriented x 3, CN2-12 intact, strength normal upper extremities and lower extremities, sensation normal throughout, DTRs 2+ throughout, no cerebellar signs, gait normal Psychiatric: normal affect, behavior normal, pleasant  Breast/gyn/rectal - deferred to gynecology     Assessment and Plan :   Encounter Diagnoses  Name Primary?   Encounter for health maintenance examination in adult Yes   Needs flu shot    Anxiety and depression    Attention deficit hyperactivity disorder (ADHD), unspecified ADHD type    Vitamin D  deficiency, unspecified     Uses hearing aid    Thoracic aortic aneurysm without rupture, unspecified part (HCC)    Pure hypercholesterolemia    Osteoporosis, unspecified osteoporosis type, unspecified pathological fracture presence    Acquired hypothyroidism    Gastroesophageal reflux disease, unspecified whether esophagitis present    High risk medication use    Screening for diabetes mellitus    History of endometrial cancer    Moderate persistent asthma without complication    Autoimmune dermatitis      This visit was a preventative care visit, also known as wellness visit or routine physical.   Topics typically include  healthy lifestyle, diet, exercise, preventative care, vaccinations, sick and well care, proper use of emergency dept and after hours care, as well as other concerns.    Separate significant issues discussed: Asthma-she will check insurance about other options as Ranell is too expensive.  She needs to be on a daily maintenance inhaler.  She can continue albuterol  as needed for rescue  Anxiety and depression -continue Wellbutrin  75 mg daily, Lexapro  10 mg daily  ADD-continue Adderall 10 mg daily, routine labs today  hypothyroidism-continue current medication levothyroxine  88 mcg daily, labs today  Vitamin D  deficiency-continue vitamin D  supplement daily  Autoimmune vaginitis-seeing subspecialist, on halobetasol cream  Thoracic aortic aneurysm-managed by cardiology  Osteopenia on bone density but osteoporosis based on 2 fractures in recent years-we again discussed other medications to slow down the rate of bone loss.  She will check again with the recommendations I gave her and let me know how she wants to move forward.  Consider referral to osteoporosis clinic  Hyperlipidemia-continue rosuvastatin  Crestor  10 mg daily, labs today  History of herpes-continue Valtrex  oral as needed, acyclovir  ointment as needed   General Recommendations: Continue to return yearly for your annual wellness and preventative care visits.  This gives us  a chance to discuss healthy lifestyle, exercise, vaccinations, review your chart record, and perform screenings where appropriate.  I recommend you see your eye doctor yearly for routine vision care.  I recommend you see your dentist yearly for routine dental care including hygiene visits twice yearly.  See your gynecologist yearly for routine gynecological care.    Vaccination recommendations were reviewed Immunization History  Administered Date(s) Administered   Fluad Trivalent(High Dose 65+) 11/05/2022   INFLUENZA, HIGH DOSE SEASONAL PF 11/28/2019,  11/26/2020   Influenza-Unspecified 12/21/2016, 12/01/2018, 10/30/2021   PFIZER(Purple Top)SARS-COV-2 Vaccination 03/24/2019, 04/16/2019, 12/12/2019   PNEUMOCOCCAL CONJUGATE-20 03/06/2021   Pfizer Covid-19 Vaccine Bivalent Booster 64yrs & up 11/26/2020   Pfizer(Comirnaty)Fall Seasonal Vaccine 12 years and older 11/26/2022   Pneumococcal Conjugate-13 02/06/2018   Tdap 06/02/2021   Zoster Recombinant(Shingrix) 12/01/2018, 05/08/2019    Advised yearly flu shot    Screening for cancer: Colon cancer screening: Prior or last colon cancer screen: 2023   Breast cancer screening: You should perform a self breast exam monthly.   We reviewed recommendations for regular mammograms and breast cancer screening. Last mammogram: 12/2022 reviewed  Cervical cancer screening: We reviewed recommendations for pap smear screening. Last pap - sees gyn   Skin cancer screening: Check your skin regularly for new changes, growing lesions, or other lesions of concern Come in for evaluation if you have skin lesions of concern.  Lung cancer screening: If you have a greater than 20 pack year history of tobacco use, then you may qualify for lung cancer screening with a chest CT scan.   Please call your insurance company to inquire about coverage for this test.  Pancreatic cancer: no current screening test is available routinely recommended.  (Risk factors: Smoking, overweight or obese, diabetes, chronic pancreatitis, work Nurse, mental health, Solicitor, 9 year old or greater, female greater than female, African-American, family history of pancreatic cancer, hereditary breast, ovarian, melanoma, Lynch, Peutz-jeghers).  We currently don't have screenings for other cancers besides breast, cervical, colon, and lung cancers.  If you have a strong family history of cancer or have other cancer screening concerns, please let me know.    Bone health: Get at least 150 minutes of aerobic exercise weekly Get  weight bearing exercise at least once weekly Bone density test:  A bone density test is an imaging test that uses a type of X-ray to measure the amount of calcium  and other minerals in your bones. The test may be used to diagnose or screen you for a condition that causes weak or thin bones (osteoporosis), predict your risk for a broken bone (fracture), or determine  how well your osteoporosis treatment is working. The bone density test is recommended for females 65 and older, or females or males <65 if certain risk factors such as thyroid disease, long term use of steroids such as for asthma or rheumatological issues, vitamin D  deficiency, estrogen deficiency, family history of osteoporosis, self or family history of fragility fracture in first degree relative.    Heart health: Get at least 150 minutes of aerobic exercise weekly Limit alcohol It is important to maintain a healthy blood pressure and healthy cholesterol numbers  Heart disease screening: Screening for heart disease includes screening for blood pressure, fasting lipids, glucose/diabetes screening, BMI height to weight ratio, reviewed of smoking status, physical activity, and diet.    Goals include blood pressure 120/80 or less, maintaining a healthy lipid/cholesterol profile, preventing diabetes or keeping diabetes numbers under good control, not smoking or using tobacco products, exercising most days per week or at least 150 minutes per week of exercise, and eating healthy variety of fruits and vegetables, healthy oils, and avoiding unhealthy food choices like fried food, fast food, high sugar and high cholesterol foods.      Vascular disease screening: For high risk individuals including smokers, diabetes, patients with known heart disease or high blood pressure, kidney disease, and others, screening for vascular disease or atherosclerosis of the arteries is available.  Examples may include carotid ultrasound, abdominal aortic  ultrasound, ABI blood flow screening in the legs, thoracic aorta screening.    Medical care options: I recommend you continue to seek care here first for routine care.  We try really hard to have available appointments Monday through Friday daytime hours for sick visits, acute visits, and physicals.  Urgent care should be used for after hours and weekends for significant issues that cannot wait till the next day.  The emergency department should be used for significant potentially life-threatening emergencies.  The emergency department is expensive, can often have long wait times for less significant concerns, so try to utilize primary care, urgent care, or telemedicine when possible to avoid unnecessary trips to the emergency department.  Virtual visits and telemedicine have been introduced since the pandemic started in 2020, and can be convenient ways to receive medical care.  We offer virtual appointments as well to assist you in a variety of options to seek medical care.   Legal Take the time to do a Last Will and Testament, advanced directives including Healthcare Power of Attorney and Living Will documents.  Do not leave your family with burdens that can be handled ahead of time.   Advanced Directives: I recommend you consider completing a Health Care Power of Attorney and Living Will.   These documents respect your wishes and help alleviate burdens on your loved ones if you were to become terminally ill or be in a position to need those documents enforced.    You can complete Advanced Directives yourself, have them notarized, then have copies made for our office, for you and for anybody you feel should have them in safe keeping.  Or, you can have an attorney prepare these documents.   If you haven't updated your Last Will and Testament in a while, it may be worthwhile having an attorney prepare these documents together and save on some costs.     Nashea was seen today for annual  exam.  Diagnoses and all orders for this visit:  Encounter for health maintenance examination in adult -     Comprehensive metabolic panel with GFR -  CBC -     Lipid panel -     TSH + free T4 -     Hemoglobin A1c -     Drug Screen 10 W/Conf, Se  Needs flu shot -     Cancel: Flu vaccine HIGH DOSE PF(Fluzone Trivalent)  Anxiety and depression  Attention deficit hyperactivity disorder (ADHD), unspecified ADHD type  Vitamin D  deficiency, unspecified   Uses hearing aid  Thoracic aortic aneurysm without rupture, unspecified part (HCC)  Pure hypercholesterolemia -     Lipid panel  Osteoporosis, unspecified osteoporosis type, unspecified pathological fracture presence  Acquired hypothyroidism -     CBC -     TSH + free T4  Gastroesophageal reflux disease, unspecified whether esophagitis present  High risk medication use -     CBC -     Drug Screen 10 W/Conf, Se  Screening for diabetes mellitus -     Hemoglobin A1c  History of endometrial cancer  Moderate persistent asthma without complication  Autoimmune dermatitis Comments: vagina  Other orders -     valACYclovir  (VALTREX ) 500 MG tablet; Take 1 tablet (500 mg total) by mouth 2 (two) times daily. X 3 days for flare up -     acyclovir  ointment (ZOVIRAX ) 5 %; Apply 1 Application topically every 3 (three) hours.   Follow-up pending labs, yearly for physical

## 2023-11-16 ENCOUNTER — Ambulatory Visit: Payer: Self-pay | Admitting: Medical

## 2023-11-16 ENCOUNTER — Other Ambulatory Visit: Payer: Self-pay | Admitting: Medical

## 2023-11-16 MED ORDER — BUPROPION HCL 75 MG PO TABS: 75.0000 mg | ORAL_TABLET | Freq: Every day | ORAL | 1 refills | Status: DC

## 2023-11-16 MED ORDER — ALBUTEROL SULFATE HFA 108 (90 BASE) MCG/ACT IN AERS: 2.0000 | INHALATION_SPRAY | Freq: Four times a day (QID) | RESPIRATORY_TRACT | 1 refills | Status: AC | PRN

## 2023-11-16 MED ORDER — FAMOTIDINE 20 MG PO TABS: 20.0000 mg | ORAL_TABLET | Freq: Every day | ORAL | 3 refills | Status: AC

## 2023-11-16 MED ORDER — ESCITALOPRAM OXALATE 10 MG PO TABS: 10.0000 mg | ORAL_TABLET | Freq: Every day | ORAL | 3 refills | Status: DC

## 2023-11-16 MED ORDER — VITAMIN D3 25 MCG (1000 UNIT) PO TABS: 1000.0000 [IU] | ORAL_TABLET | Freq: Every day | ORAL | 3 refills | Status: AC

## 2023-11-16 MED ORDER — LEVOTHYROXINE SODIUM 88 MCG PO TABS: 88.0000 ug | ORAL_TABLET | Freq: Every day | ORAL | 3 refills | Status: DC

## 2023-11-16 MED ORDER — ROSUVASTATIN CALCIUM 10 MG PO TABS: 10.0000 mg | ORAL_TABLET | Freq: Every day | ORAL | 3 refills | Status: DC

## 2023-11-16 MED ORDER — FLUTICASONE FUROATE-VILANTEROL 100-25 MCG/ACT IN AEPB: 1.0000 | INHALATION_SPRAY | Freq: Every day | RESPIRATORY_TRACT | 3 refills | Status: DC

## 2023-11-16 MED ORDER — AMPHETAMINE-DEXTROAMPHETAMINE 10 MG PO TABS: 10.0000 mg | ORAL_TABLET | Freq: Every day | ORAL | 0 refills | Status: DC

## 2023-11-16 NOTE — Progress Notes (Signed)
 Results thru my chart

## 2023-11-21 ENCOUNTER — Telehealth: Payer: Self-pay

## 2023-11-21 ENCOUNTER — Other Ambulatory Visit (HOSPITAL_COMMUNITY): Payer: Self-pay

## 2023-11-21 NOTE — Telephone Encounter (Signed)
 Pharmacy Patient Advocate Encounter   Received notification from Physician's Office that prior authorization for (BREO ELLIPTA ) 100-25 MCG/ACT AEPB  is required/requested.   Insurance verification completed.   The patient is insured through [employer] .   Per test claim: The current 90 day co-pay is, $273.04.  No PA needed at this time. This test claim was processed through [facility]- copay amounts may vary at other pharmacies due to pharmacy/plan contracts, or as the patient moves through the different stages of their insurance plan.

## 2023-11-21 NOTE — Progress Notes (Signed)
 Your prediabetes numbers have been the same prior, so in general be careful with diet, avoid excess carbs in general, eat vegetables daily, lean meats, and small portions of whole grains.    We do random drug screens on patients who take controlled substances.  She is on Adderall and my understanding is that she takes this daily.   However, this did not show up on the drug screen.  What is her explanation of this?

## 2023-11-21 NOTE — Telephone Encounter (Signed)
 Copied from CRM [id number]. Topic: Clinical - Prescription Issue >> Nov 21, 2023  4:17 PM Wess RAMAN wrote: Reason for CRM: [facility] pharmacy stated fluticasone  furoate-vilanterol (BREO ELLIPTA ) 100-25 MCG/ACT AEPB was written for 90 MCG but only comes in 60 MCG.  Pharmacy: [facility] [id number], [place] - [address] [place] [address] Phone: [phone] Fax: [phone] Hours: Not open 24 hours

## 2023-11-22 ENCOUNTER — Other Ambulatory Visit: Payer: Self-pay | Admitting: *Deleted

## 2023-11-22 ENCOUNTER — Other Ambulatory Visit: Payer: Self-pay | Admitting: Medical

## 2023-11-22 MED ORDER — FLUTICASONE FUROATE-VILANTEROL 100-25 MCG/ACT IN AEPB: 1.0000 | INHALATION_SPRAY | Freq: Every day | RESPIRATORY_TRACT | 3 refills | Status: DC

## 2023-11-22 MED ORDER — FLUTICASONE-SALMETEROL 115-21 MCG/ACT IN AERO: 2.0000 | INHALATION_SPRAY | Freq: Two times a day (BID) | RESPIRATORY_TRACT | 2 refills | Status: AC

## 2023-11-22 NOTE — Telephone Encounter (Signed)
 Left detailed message that med was switched due to cost and let us  know any issues

## 2023-11-22 NOTE — Telephone Encounter (Signed)
Changed rx to #[phone]

## 2023-11-23 LAB — CBC
Hematocrit: 38.9 % (ref 34.0–46.6)
Hemoglobin: 11.6 g/dL (ref 11.1–15.9)
MCH: 26.9 pg (ref 26.6–33.0)
MCHC: 29.8 g/dL — ABNORMAL LOW (ref 31.5–35.7)
MCV: 90 fL (ref 79–97)
Platelets: 259 x10E3/uL (ref 150–450)
RBC: 4.31 x10E6/uL (ref 3.77–5.28)
RDW: 15 % (ref 11.7–15.4)
WBC: 10.1 x10E3/uL (ref 3.4–10.8)

## 2023-11-23 LAB — COMPREHENSIVE METABOLIC PANEL WITH GFR
ALT: 19 IU/L (ref 0–32)
AST: 30 IU/L (ref 0–40)
Albumin: 4.2 g/dL (ref 3.8–4.8)
Alkaline Phosphatase: 86 IU/L (ref 49–135)
BUN/Creatinine Ratio: 26 (ref 12–28)
BUN: 21 mg/dL (ref 8–27)
Bilirubin Total: 0.3 mg/dL (ref 0.0–1.2)
CO2: 18 mmol/L — ABNORMAL LOW (ref 20–29)
Calcium: 9.3 mg/dL (ref 8.7–10.3)
Chloride: 102 mmol/L (ref 96–106)
Creatinine, Ser: 0.82 mg/dL (ref 0.57–1.00)
Globulin, Total: 2.4 g/dL (ref 1.5–4.5)
Glucose: 99 mg/dL (ref 70–99)
Potassium: 5.2 mmol/L (ref 3.5–5.2)
Sodium: 137 mmol/L (ref 134–144)
Total Protein: 6.6 g/dL (ref 6.0–8.5)
eGFR: 76 mL/min/1.73 (ref >59)

## 2023-11-23 LAB — LIPID PANEL
Chol/HDL Ratio: 2.1 ratio (ref 0.0–4.4)
Cholesterol, Total: 183 mg/dL (ref 100–199)
HDL: 86 mg/dL (ref >39)
LDL Chol Calc (NIH): 83 mg/dL (ref 0–99)
Triglycerides: 75 mg/dL (ref 0–149)
VLDL Cholesterol Cal: 14 mg/dL (ref 5–40)

## 2023-11-23 LAB — THC,MS,WB/SP RFX
Cannabidiol: NEGATIVE
Cannabinoid Confirmation: POSITIVE
Carboxy-THC: 20.4 ng/mL
Hydroxy-THC: NEGATIVE ng/mL
Tetrahydrocannabinol(THC): NEGATIVE ng/mL

## 2023-11-23 LAB — DRUG SCREEN 10 W/CONF, SERUM
Amphetamines, IA: NEGATIVE ng/mL
Barbiturates, IA: NEGATIVE ug/mL
Benzodiazepines, IA: NEGATIVE ng/mL
Cocaine & Metabolite, IA: NEGATIVE ng/mL
Methadone, IA: NEGATIVE ng/mL
Opiates, IA: NEGATIVE ng/mL
Oxycodones, IA: NEGATIVE ng/mL
Phencyclidine, IA: NEGATIVE ng/mL
Propoxyphene, IA: NEGATIVE ng/mL
THC(Marijuana) Metabolite, IA: POSITIVE ng/mL — AB

## 2023-11-23 LAB — HEMOGLOBIN A1C
Est. average glucose Bld gHb Est-mCnc: 123 mg/dL
Hgb A1c MFr Bld: 5.9 % — ABNORMAL HIGH (ref 4.8–5.6)

## 2023-11-23 LAB — TSH+FREE T4
Free T4: 1.4 ng/dL (ref 0.82–1.77)
TSH: 0.608 u[IU]/mL (ref 0.450–4.500)

## 2023-11-23 NOTE — Progress Notes (Signed)
 See prior message.  Please ask about the drug screen question

## 2023-11-30 ENCOUNTER — Other Ambulatory Visit: Payer: Self-pay | Admitting: Medical

## 2023-12-13 ENCOUNTER — Ambulatory Visit: Admitting: Medical

## 2023-12-13 VITALS — BP 110/68 | HR 73 | Wt 138.6 lb

## 2023-12-13 DIAGNOSIS — F1290 Cannabis use, unspecified, uncomplicated: Secondary | ICD-10-CM

## 2023-12-13 DIAGNOSIS — R61 Generalized hyperhidrosis: Secondary | ICD-10-CM | POA: Diagnosis not present

## 2023-12-13 DIAGNOSIS — R42 Dizziness and giddiness: Secondary | ICD-10-CM

## 2023-12-13 DIAGNOSIS — R29898 Other symptoms and signs involving the musculoskeletal system: Secondary | ICD-10-CM

## 2023-12-13 DIAGNOSIS — R251 Tremor, unspecified: Secondary | ICD-10-CM

## 2023-12-13 DIAGNOSIS — R519 Headache, unspecified: Secondary | ICD-10-CM

## 2023-12-13 NOTE — Progress Notes (Signed)
 Name: Jamie Singh   Date of Visit: 12/13/23   Date of last visit with me: 11/30/2023   CHIEF COMPLAINT:  Chief Complaint  Patient presents with   Hospitalization Follow-up    Would like MRI or CT to show if she has had any TIAs as her family member had similar symptoms and she was diagnostics with TIAs. Just had Holter monitor recently       HPI:  Discussed the use of AI scribe software for clinical note transcription with the patient, who gave verbal consent to proceed.  History of Present Illness  Here for concerns.  She had an emergency department visit December 01, 2023.  At that time she was having lightheadedness with standing, nausea, was diaphoretic and pale.  She did not have a head injury or fall.  She denied any preceding palpitations shortness of.  When she was seen by EMS she was pale and extremely diaphoretic.  There was quite a bit of difference in left provide blood pressures on initial ED eval.  She had tried a recent new THC edible prior to the concert she went to when the lightheadedness event happened.  During the episode, she reported a sinus headache that resolved with medication, likely Tylenol . She occasionally experiences unilateral head pain that resolves spontaneously. No numbness, tingling, or weakness, but she did experience arm tremors and difficulty engaging in conversation during the episode.  After the ED visit, it took a week to feel right again after the initial lightheadeness.    Currently she notes she went hiking this past weekend in the mountains and had no issues, no lightheadedness, no SOB, no chest pain.   Curious about eval for TIA given family members with hx/o TIA and she wants head scan to eval for TIA.  Cardiothoracic specliasit is Dr. Zachary Remington IV at [facility]. Dr. Peter Swazil[place], cardiology [facility] 04/2023.    She is currently taking Adderall 10 mg, usually daily, but did not take it during a recent camping trip. It helps with her  attention span, although she still identifies as having ADD. She is prescribed 90 tablets at a time and refills them approximately every 90 to 100 days.    Past Medical History:  Diagnosis Date   ADHD    Allergy    Anxiety and depression    Aortic aneurysm    Asthma    mild, but prior on preventative FLovent   Endometrial cancer (HCC) 2020   Genital herpes    GERD (gastroesophageal reflux disease)    Hyperlipidemia    Hypothyroidism    Osteoporosis    Thoracic aortic aneurysm    Wears hearing aid    Current Outpatient Medications on File Prior to Visit  Medication Sig Dispense Refill   acyclovir  ointment (ZOVIRAX ) 5 % Apply 1 Application topically every 3 (three) hours. 15 g 1   albuterol  (VENTOLIN  HFA) 108 (90 Base) MCG/ACT inhaler Inhale 2 puffs into the lungs every 6 (six) hours as needed for wheezing or shortness of breath. 3 each 1   amphetamine -dextroamphetamine  (ADDERALL) 10 MG tablet Take 1 tablet (10 mg total) by mouth daily with breakfast. 90 tablet 0   aspirin 81 MG chewable tablet Chew 81 mg by mouth daily.     buPROPion  (WELLBUTRIN ) 75 MG tablet TAKE 1 TABLET DAILY 90 tablet 3   Calcium -Magnesium 250-125 MG TABS Take 2 tablets by mouth daily.     cholecalciferol (VITAMIN D3) 25 MCG (1000 UNIT) tablet Take 1 tablet (  1,000 Units total) by mouth daily. 90 tablet 3   escitalopram  (LEXAPRO ) 10 MG tablet Take 1 tablet (10 mg total) by mouth daily. 90 tablet 3   estradiol  (ESTRACE  VAGINAL) 0.1 MG/GM vaginal cream Use small fingertip size amount of cream at the entrance of the vagina at bedtime three times a week for the next two weeks 42.5 g 0   famotidine  (PEPCID ) 20 MG tablet Take 1 tablet (20 mg total) by mouth at bedtime. 90 tablet 3   fluticasone -salmeterol (ADVAIR HFA) 115-21 MCG/ACT inhaler Inhale 2 puffs into the lungs 2 (two) times daily. 1 each 2   halobetasol (ULTRAVATE) 0.05 % ointment Apply topically.     levothyroxine  (SYNTHROID ) 88 MCG tablet Take 1 tablet (88  mcg total) by mouth daily. 90 tablet 3   rosuvastatin  (CRESTOR ) 10 MG tablet Take 1 tablet (10 mg total) by mouth daily. 90 tablet 3   valACYclovir  (VALTREX ) 500 MG tablet Take 1 tablet (500 mg total) by mouth 2 (two) times daily. X 3 days for flare up 90 tablet 0   PSYLLIUM HUSK PO Take 2,000 mg by mouth daily.     No current facility-administered medications on file prior to visit.   Family History  Problem Relation Age of Onset   COPD Mother    Heart failure Mother    Macular degeneration Mother    Osteoporosis Mother    Cancer Mother        Mom needed a hysterectomy, thinks that she may have had uterine cancer   Heart disease Father        MI   COPD Father    Aortic aneurysm Father    Heart disease Brother        MI   Myelodysplastic syndrome Brother    Osteoporosis Maternal Grandmother    Heart failure Maternal Grandmother    Heart failure Maternal Grandfather    COPD Maternal Grandfather    Stroke Paternal Grandfather    Breast cancer Neg Hx    Colon cancer Neg Hx    Colon polyps Neg Hx    Esophageal cancer Neg Hx    Rectal cancer Neg Hx    Stomach cancer Neg Hx     ROS as in subjective    Objective: BP 110/68   Pulse 73   Wt 138 lb 9.6 oz (62.9 kg)   SpO2 98%   BMI 28.23 kg/m   Wt Readings from Last 3 Encounters:  12/13/23 138 lb 9.6 oz (62.9 kg)  11/15/23 141 lb 3.2 oz (64 kg)  04/12/23 142 lb 12.8 oz (64.8 kg)   BP Readings from Last 3 Encounters:  12/13/23 110/68  11/15/23 120/80  04/12/23 113/72    Echocardiogram 12/01/2023 showing normal left ventricular systolic function, no LVH, EF 44%, aorta was 4.3 cm abnormal.  She was also found to have moderate aortic regurgitation, mild mitral regurgitation, mild TR, trivial TR.   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   Impression  1. Study was adequate for interpretation 2. The predominant rhythm was sinus 3. VE burden <1 %. 4. There were 0 Patient Triggers. Narrative  *The predominant rhythm was Sinus. *The Maximum Heart Rate recorded was 183 bpm, 10/04 10:53:06, the Minimum Heart Rate recorded was  45 bpm, 10/04 03:52:03, and the Average Heart Rate was 63 bpm. *There were 110 VE beats with a burden of <1 %. There were  5 occurrences of Ventricular Tachycardia with the Fastest episode 183 bpm, 10/04 10:53:06, and the Longest episode 9 beats, 10/04 10:53:06. *There were 501 SVE beats with a burden of <1 %. There were 4 occurrences of Supraventricular Tachycardia with the Fastest episode 120 bpm, 10/02 19:09:04, and the Longest episode 4 beats, 10/02 19:09:04. *Other rhythms in this study include: Ventricular Run. *There were 0 Patient Triggers. IMPRESSION: 1. Study was adequate for interpretation 2. The predominant rhythm was sinus 3. VE burden <1 %. 4. There were 0 Patient Triggers.   12/13/23 exam:  General appearence: alert, no distress, WD/WN,  HEENT: normocephalic, sclerae anicteric Oral cavity: MMM, no lesions Neck: supple, no lymphadenopathy, no thyromegaly, no masses, no bruits Heart: RRR, normal S1, S2, no murmurs Lungs: CTA bilaterally, no wheezes, rhonchi, or rales Pulses: 2+ symmetric, upper and lower extremities, normal cap refill Psych: Pleasant, answers question appropriately Neuro: CN II through XII intact, slightly off balance at times with heel-to-toe but otherwise no focal findings    Assessment: Encounter Diagnoses  Name Primary?   Lightheaded Yes   Diaphoresis    Tremor    Bilateral arm weakness    Nonintractable headache, unspecified chronicity pattern, unspecified headache type    Episodic cannabis use      Plan: Lightheadedness and presyncope episode with transient arm tremor and  weakness Episode on 10/2 with lightheadedness, sweating, nausea, transient arm tremor, and weakness. No residual neurological deficits. Differential includes TIA, stroke, or vasculitis. Considered THC use as a contributing factor. - Order CT angiogram of head and neck to assess blood flow and rule out TIA or stroke.  Ventricular tachycardia, brief non-sustained Five brief VTAC episodes on event monitor, longest 9 beats. Awaiting cardiologist's evaluation. - Await cardiologist's feedback from [facility] regarding event monitor results. - If no contact from [facility] within a week, notify provider to follow up with Dr. Swaziland.  Thoracic aortic aneurysm Well-managed since 2020. No acute changes or symptoms suggestive of complications.  Attention-deficit hyperactivity disorder, predominantly inattentive type Currently on 10 mg Adderall, 90 tablets at a time. Inconsistent use reported. Recent negative drug test for Adderall. Discussed importance of consistent use for accurate testing. - we will  plan repeat drug screen soon.  We discussed the proper use of medicine for safekeeping of medication.  Cannabis use Recent THC gummy and drink use noted. Discussed potential contribution to lightheadedness episode. No current use reported since the episode.  Follow-up Follow-up plans discussed for ongoing evaluation and management. - Expect phone call for scheduling of CT angiogram. - Notify provider if no contact from [facility] regarding event monitor results within a week.   Terrionna was seen today for hospitalization follow-up.  Diagnoses and all orders for this visit:  Lightheaded -     CT ANGIO NECK W OR WO CONTRAST; Future -     CT ANGIO HEAD W OR WO CONTRAST; Future  Diaphoresis -     CT ANGIO NECK W OR WO CONTRAST; Future -     CT ANGIO HEAD W OR WO CONTRAST; Future  Tremor -     CT ANGIO NECK W OR WO CONTRAST; Future -     CT ANGIO HEAD W OR WO CONTRAST; Future  Bilateral arm weakness -     CT  ANGIO NECK W OR WO CONTRAST; Future -  CT ANGIO HEAD W OR WO CONTRAST; Future  Nonintractable headache, unspecified chronicity pattern, unspecified headache type -     CT ANGIO NECK W OR WO CONTRAST; Future -     CT ANGIO HEAD W OR WO CONTRAST; Future  Episodic cannabis use    F/u pending CTA scans

## 2023-12-15 ENCOUNTER — Ambulatory Visit
Admission: RE | Admit: 2023-12-15 | Discharge: 2023-12-15 | Disposition: A | Discharge status: Home / Self Care | Source: Ambulatory Visit | Attending: Medical | Admitting: Medical

## 2023-12-15 ENCOUNTER — Inpatient Hospital Stay: Admission: RE | Admit: 2023-12-15 | Source: Ambulatory Visit

## 2023-12-15 DIAGNOSIS — R42 Dizziness and giddiness: Secondary | ICD-10-CM

## 2023-12-15 DIAGNOSIS — R29898 Other symptoms and signs involving the musculoskeletal system: Secondary | ICD-10-CM

## 2023-12-15 DIAGNOSIS — R519 Headache, unspecified: Secondary | ICD-10-CM

## 2023-12-15 DIAGNOSIS — R61 Generalized hyperhidrosis: Secondary | ICD-10-CM

## 2023-12-15 DIAGNOSIS — R251 Tremor, unspecified: Secondary | ICD-10-CM

## 2023-12-15 MED ORDER — IOPAMIDOL (ISOVUE-370) INJECTION 76%
75.0000 mL | Freq: Once | INTRAVENOUS | Status: AC | PRN
  Administered 2023-12-15: 75 mL via INTRAVENOUS

## 2023-12-19 ENCOUNTER — Ambulatory Visit: Payer: Self-pay | Admitting: Medical

## 2023-12-19 NOTE — Progress Notes (Signed)
 Hello to you both.    I was curious if either of you should see this patient in consult.    She had some unusual symptoms 12/01/23 with lightheadedness, nausea, diaphoresis, pallor ended up having emergency eval at [facility].   I saw her after the fact while cardiology was evaluating with event monitor, but we ended up pursuing head imaging.    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  In the past I would send these to Dr. Deveshwar/interventional radiology but I was informed that he is no longer available. Not sure this finding of aneurysm needs any particular treatment.  I appreciate your help. Ludie Gent, PA-C

## 2023-12-20 NOTE — Progress Notes (Signed)
 Results thru my chart

## 2023-12-23 ENCOUNTER — Other Ambulatory Visit: Payer: Self-pay | Admitting: Medical

## 2023-12-23 MED ORDER — ALENDRONATE SODIUM 70 MG PO TABS: 70.0000 mg | ORAL_TABLET | ORAL | 11 refills | Status: AC

## 2023-12-26 ENCOUNTER — Other Ambulatory Visit: Payer: Self-pay | Admitting: Medical

## 2023-12-26 MED ORDER — AMPHETAMINE-DEXTROAMPHETAMINE 10 MG PO TABS: 10.0000 mg | ORAL_TABLET | Freq: Every day | ORAL | 0 refills | Status: DC

## 2024-01-02 ENCOUNTER — Encounter: Payer: Self-pay | Admitting: Radiology

## 2024-01-18 DIAGNOSIS — M810 Age-related osteoporosis without current pathological fracture: Secondary | ICD-10-CM

## 2024-01-22 ENCOUNTER — Other Ambulatory Visit: Payer: Self-pay | Admitting: Medical

## 2024-01-23 ENCOUNTER — Encounter: Payer: Self-pay | Admitting: Physician Assistant

## 2024-01-23 ENCOUNTER — Ambulatory Visit: Admitting: Physician Assistant

## 2024-01-23 DIAGNOSIS — M8000XA Age-related osteoporosis with current pathological fracture, unspecified site, initial encounter for fracture: Secondary | ICD-10-CM

## 2024-01-23 MED ORDER — ROMOSOZUMAB-AQQG 105 MG/1.17ML ~~LOC~~ SOSY: 210.0000 mg | PREFILLED_SYRINGE | Freq: Once | SUBCUTANEOUS | Status: AC

## 2024-01-23 NOTE — Progress Notes (Signed)
 Office Visit Note   Patient: Jamie Singh           Date of Birth: 1950/08/19           MRN: [id number] Visit Date: 01/23/2024              Requested by: Bulah Alm RAMAN, PA-C [id number] [place],  [place] [id number] PCP: Bulah Alm RAMAN, PA-C   Assessment & Plan: Visit Diagnoses:  1. Age-related osteoporosis with current pathological fracture, initial encounter     Plan: Patient is a pleasant 73 year old woman who is referred by Alm Bulah, her primary care provider.  She is here to be treated for osteoporosis.  By definition she has osteoporosis as she has had multiple insufficiency fractures including her wrist and sacrum and her shoulder.  She had tried soft Fosamax  but had significant side effects including nausea vomiting lightheadedness.  She does use calcium  and vitamin D  both of which are good by lab work.  She does not have any cardiac disease.  She does have a history of endometrial cancer treated in 2020 she has no history of kidney disease, gastric or peptic ulcer, or gastric bypass.  She does have reflux and uses Pepcid .  She went through menopause in her late 86s to 65s she did not do any hormone replacement therapy.  She is not a smoker she drinks 4-5 alcoholic beverages per week.  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  Because of all this I do think given her activity level she would be a good candidate for Evenity  followed by Prolia.  She is well aware of the side effects of Prolia as she has friends who take it and she has read about this quite a bit.  I do think given her history of  fragility fractures and her family history and her bone density scores combined she should plan on trying Evenity  for a year this could be followed then by maintenance on Prolia given her information about Evenity  we will go forward and try to obtain authorization.  I spent 45 minutes reviewing side effects of medication as well as outcomes.  Follow-Up Instructions: No follow-ups on file.   Orders:  No orders of the defined types were placed in this encounter.  No orders of the defined types were placed in this encounter.     Procedures: No procedures performed   Clinical Data: No additional findings.   Subjective: No chief complaint on file.   HPI  Review of Systems  All other systems reviewed and are negative.    Objective: Vital Signs: There were no vitals taken for this visit.  Physical Exam Pulmonary:     Effort: Pulmonary effort is normal.  Skin:    General: Skin is warm and dry.  Neurological:     General: No focal deficit present.     Mental Status: She is alert.  Psychiatric:        Mood and Affect: Mood normal.        Behavior: Behavior normal.    Ortho Exam  Specialty Comments:  No  specialty comments available.  Imaging: No results found.   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   Pure hypercholesterolemia 08/20/2020   Vitamin D  deficiency, unspecified  08/20/2020   Urinary frequency 08/20/2020   Aortic valve regurgitation 05/19/2020   Gastroesophageal reflux disease 05/19/2020   Encounter for health maintenance examination in adult 02/20/2020   Uses hearing aid 02/20/2020    Vaccine counseling 02/20/2020   Post-menopausal 02/20/2020   Estrogen deficiency 02/20/2020   Snoring 02/20/2020   History of eating disorder 02/20/2020   History of endometrial cancer 10/18/2019   Thoracic aortic aneurysm without rupture 10/18/2019   S/P hysterectomy 10/18/2019   Excessive sweating 10/18/2019   Hypothyroidism 10/18/2019   Past Medical History:  Diagnosis Date   ADHD    Allergy    Anxiety and depression    Aortic aneurysm    Asthma    mild, but prior on preventative FLovent   Endometrial cancer (HCC) 2020   Genital herpes    GERD (gastroesophageal reflux disease)    Hyperlipidemia    Hypothyroidism    Osteoporosis    Thoracic aortic aneurysm    Wears hearing aid     Family History  Problem Relation Age of Onset   COPD Mother    Heart failure Mother    Macular degeneration Mother    Osteoporosis Mother    Cancer Mother        Mom needed a hysterectomy, thinks that she may have had uterine cancer   Heart disease Father        MI   COPD Father    Aortic aneurysm Father    Heart disease Brother        MI   Myelodysplastic syndrome Brother    Osteoporosis Maternal Grandmother    Heart failure Maternal Grandmother    Heart failure Maternal Grandfather    COPD Maternal Grandfather    Stroke Paternal Grandfather    Breast cancer Neg Hx    Colon cancer Neg Hx    Colon polyps Neg Hx    Esophageal cancer Neg Hx    Rectal cancer Neg Hx    Stomach cancer Neg Hx     Past Surgical History:  Procedure Laterality Date   ABDOMINAL HYSTERECTOMY     COLONOSCOPY  2019   ESOPHAGOGASTRODUODENOSCOPY ENDOSCOPY     ROBOTIC ASSISTED TOTAL HYSTERECTOMY WITH BILATERAL SALPINGO OOPHERECTOMY     RA-TLH/BSO   TONSILLECTOMY AND ADENOIDECTOMY     WRIST FRACTURE SURGERY Right    Social History   Occupational History   Occupation: hydrographic surveyor - retired  Tobacco Use   Smoking status: Never   Smokeless tobacco: Never  Vaping Use   Vaping status: Never Used   Substance and Sexual Activity   Alcohol use: Yes    Alcohol/week: 2.0 - 3.0 standard drinks of alcohol    Types: 2 - 3 Glasses of wine per week    Comment: 2-4 times monthly   Drug use: Yes    Frequency: 2.0 times per week    Types: Marijuana    Comment: gummies   Sexual activity: Not Currently    Birth control/protection: Post-menopausal

## 2024-01-30 MED ORDER — ESCITALOPRAM OXALATE 10 MG PO TABS: 10.0000 mg | ORAL_TABLET | Freq: Every day | ORAL | 3 refills | Status: AC

## 2024-01-30 MED ORDER — ESCITALOPRAM OXALATE 10 MG PO TABS: 10.0000 mg | ORAL_TABLET | Freq: Every day | ORAL | 0 refills | Status: AC

## 2024-02-13 ENCOUNTER — Other Ambulatory Visit: Payer: Self-pay | Admitting: Medical

## 2024-03-05 ENCOUNTER — Other Ambulatory Visit: Payer: Self-pay | Admitting: Medical

## 2024-03-05 ENCOUNTER — Other Ambulatory Visit: Payer: Self-pay

## 2024-03-05 DIAGNOSIS — Z1231 Encounter for screening mammogram for malignant neoplasm of breast: Secondary | ICD-10-CM

## 2024-03-07 ENCOUNTER — Ambulatory Visit
Admission: RE | Admit: 2024-03-07 | Discharge: 2024-03-07 | Disposition: A | Discharge status: Home / Self Care | Source: Ambulatory Visit

## 2024-03-07 DIAGNOSIS — Z1231 Encounter for screening mammogram for malignant neoplasm of breast: Secondary | ICD-10-CM

## 2024-03-08 ENCOUNTER — Ambulatory Visit: Payer: Self-pay | Admitting: Medical

## 2024-03-15 ENCOUNTER — Other Ambulatory Visit: Payer: Self-pay | Admitting: Medical

## 2024-03-22 ENCOUNTER — Encounter: Payer: Self-pay | Admitting: Physical Medicine and Rehabilitation

## 2024-03-22 ENCOUNTER — Ambulatory Visit: Admitting: Physical Medicine and Rehabilitation

## 2024-03-22 DIAGNOSIS — G8929 Other chronic pain: Secondary | ICD-10-CM | POA: Diagnosis not present

## 2024-03-22 DIAGNOSIS — S39012A Strain of muscle, fascia and tendon of lower back, initial encounter: Secondary | ICD-10-CM | POA: Diagnosis not present

## 2024-03-22 DIAGNOSIS — M545 Low back pain, unspecified: Secondary | ICD-10-CM | POA: Diagnosis not present

## 2024-03-22 NOTE — Progress Notes (Signed)
 Core Outcome Measures Index (COMI) Back Score  Average Pain 0  COMI Score 0 %

## 2024-03-22 NOTE — Progress Notes (Signed)
 Pain Scale   Average Pain 2 Patient advising she has chronic lower back pain that comes and goes.        +Driver, -BT, -Dye Allergies.

## 2024-03-22 NOTE — Progress Notes (Signed)
 "  Jamie Singh - 74 y.o. female MRN [id number]  Date of birth: August 17, 1950  Office Visit Note: Visit Date: 03/22/2024 PCP: Bulah Alm RAMAN, PA-C Referred by: Bulah Alm RAMAN, PA-C  Subjective: Chief Complaint  Patient presents with   Lower Back - Pain   HPI: Jamie Singh is a 74 y.o. female who comes in today as a self referral for evaluation of acute on chronic bilateral lower back pain. Pain ongoing for about 1 month after moving boxes into a new home. Also states she slid on hardwood dog trying to help dog about a month ago. No significant fall, states she slid into nightstand. Her pain worsens with activity and lifting. She describes pain as tight and grabbing sensation, denies pain at this time. Some relief pain with home exercise regimen, ice/heat, rest and use of OTC medications. She recently had therapeutic massage that she feels was significantly beneficial in alleviating her pain. She is fairly active and does attend water aerobics. Lumbar MRI imaging from 2022 shows extensive edema within the sacral ala bilaterally, compatible with bilateral sacral insufficiency fractures. Multilevel spondylosis of lumbar spine. Central disc protrusion at L5-S1 may contact the traversing S1 nerve roots bilaterally. No history of lumbar surgery/injections. Patient denies focal weakness, numbness and tingling.   She does have history of osteoporosis, currently being managed by my colleague Ronal Dragon Persons, PA.      Review of Systems  Musculoskeletal:  Negative for back pain and myalgias.  Neurological:  Negative for tingling, sensory change, focal weakness and weakness.  All other systems reviewed and are negative.  Otherwise per HPI.  Assessment & Plan: Visit Diagnoses:    ICD-10-CM   1. Chronic bilateral low back pain without sciatica  M54.50 Ambulatory referral to Physical Therapy   G89.29     2. Strain of lumbar region, initial encounter  S39.012A Ambulatory referral to  Physical Therapy       Plan: Findings:  Acute on chronic bilateral lower back pain. She is pain free today with continued home exercise regimen and use of OTC medications. Patients clinical presentation and exam are consistent with lumbar myofascial strain. I do feel she stained her back while moving that caused more axial back pain. She reports significant relief of pain post therapeutic massage. No radicular symptoms down the legs. No myofascial tenderness noted upon exam today. I explained to her that lumbar strain injuries can take 6-8 weeks to heal. We discussed treatment plan in detail today. I would like for her to attend short course of formal physical therapy with a focus on core strengthening and establishing home exercise regimen. She can continue to work with Ronal Dragon in the management of osteoporosis. She is hoping to get placed on [facility] . She has no questions at this time. I informed her to let us  know if her back pain increases or changes in nature, would be quick to order new lumbar MRI imaging. Her exam today is non focal, good strength noted to bilateral lower extremities. We are happy to see her back as needed.     Meds & Orders: No orders of the defined types were placed in this encounter.   Orders Placed This Encounter  Procedures   Ambulatory referral to Physical Therapy    Follow-up: Return if symptoms worsen or fail to improve.   Procedures: No procedures performed      Clinical History: CLINICAL DATA:  Left leg pain for 6 weeks. Pain with walking.   EXAM:  MRI LUMBAR SPINE WITHOUT CONTRAST   TECHNIQUE: Multiplanar, multisequence MR imaging of the lumbar spine was performed. No intravenous contrast was administered.   COMPARISON:  None.   FINDINGS: Segmentation: 5 non rib-bearing lumbar type vertebral bodies are present. The lowest fully formed vertebral body is L5.   Alignment: Slight retrolisthesis present at L1-2, L2-3, and to a lesser extent at L3-4.  Grade 1 anterolisthesis at L4-5 measures 5 mm. Rightward curvature of the thoracolumbar spine is centered at L1. History leftward curvature is present at L4.   Vertebrae: Chronic fatty endplate changes are present at L5-S1. Extensive edema is present the sacral ala bilaterally, compatible with bilateral sacral insufficiency fractures.   Conus medullaris and cauda equina: Conus extends to the L1-2 level. Conus and cauda equina appear normal.   Paraspinal and other soft tissues: Limited imaging the abdomen is unremarkable. There is no significant adenopathy. No solid organ lesions are present.   Disc levels:   T12-L1: Mild disc bulging is present without significant stenosis.   L1-2: Uncovering of a broad-based disc protrusion is present. There is some distortion of the central canal without focal stenosis. Moderate facet hypertrophy is noted bilaterally. Mild foraminal narrowing is worse left than right.   L2-3: A broad-based disc protrusion and bilateral facet hypertrophy is present. Mild right subarticular narrowing is present. Moderate foraminal narrowing is worse right than left.   L3-4: Broad-based disc bulge is present. Mild foraminal narrowing is noted bilaterally.   L4-5: Uncovering of a broad-based disc protrusion is noted. Moderate to severe facet hypertrophy is noted bilaterally. Mild central canal narrowing is present. Mild foraminal narrowing is worse right than left.   L5-S1: A central disc protrusion is present. Potential contact of the traversing S1 nerve roots is noted bilaterally. The foramina are patent.   IMPRESSION: 1. Extensive edema within the sacral ala bilaterally, compatible with bilateral sacral insufficiency fractures. 2. Multilevel spondylosis of the lumbar spine as described. Scoliosis as described. 3. Mild foraminal narrowing bilaterally at L1-2 is worse left than right. 4. Mild right subarticular and moderate foraminal stenosis at L2-3 is  worse right than left. 5. Mild central canal narrowing at L4-5 with mild foraminal narrowing bilaterally, worse right than left. 6. Central disc protrusion at L5-S1 may contact the traversing S1 nerve roots bilaterally.     Electronically Signed   By: Lonni Necessary M.D.   On: 01/10/2021 09:03   She reports that she has never smoked. She has never used smokeless tobacco.  Recent Labs    11/15/23 0938  HGBA1C 5.9*    Objective:  VS:  HT:    WT:   BMI:     BP:   HR: bpm  TEMP: ( )  RESP:  Physical Exam Vitals and nursing note reviewed.  HENT:     Head: Normocephalic and atraumatic.     Right Ear: External ear normal.     Left Ear: External ear normal.     Nose: Nose normal.     Mouth/Throat:     Mouth: Mucous membranes are moist.  Eyes:     Extraocular Movements: Extraocular movements intact.  Cardiovascular:     Rate and Rhythm: Normal rate.     Pulses: Normal pulses.  Pulmonary:     Effort: Pulmonary effort is normal.  Abdominal:     General: Abdomen is flat. There is no distension.  Musculoskeletal:        General: Normal range of motion.     Cervical back:  Normal range of motion.     Comments: Patient rises from seated position to standing without difficulty. Good lumbar range of motion. No pain noted with facet loading. 5/5 strength noted with bilateral hip flexion, knee flexion/extension, ankle dorsiflexion/plantarflexion and EHL. No clonus noted bilaterally. No pain upon palpation of greater trochanters. No pain with internal/external rotation of bilateral hips. Sensation intact bilaterally. Negative slump test bilaterally. Ambulates without aid, gait steady.     Skin:    General: Skin is warm and dry.     Capillary Refill: Capillary refill takes less than 2 seconds.  Neurological:     General: No focal deficit present.     Mental Status: She is alert and oriented to person, place, and time.  Psychiatric:        Mood and Affect: Mood normal.         Behavior: Behavior normal.     Ortho Exam  Imaging: No results found.  Past Medical/Family/Surgical/Social History: Medications & Allergies reviewed per EMR, new medications updated. 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   Pure hypercholesterolemia 08/20/2020   Vitamin D  deficiency, unspecified  08/20/2020   Urinary frequency 08/20/2020   Aortic valve regurgitation 05/19/2020   Gastroesophageal reflux disease 05/19/2020   Encounter for health maintenance examination in adult 02/20/2020   Uses hearing aid 02/20/2020   Vaccine counseling 02/20/2020   Post-menopausal 02/20/2020   Estrogen deficiency 02/20/2020   Snoring 02/20/2020   History of eating disorder 02/20/2020   History of endometrial cancer 10/18/2019   Thoracic aortic aneurysm without rupture 10/18/2019   S/P hysterectomy 10/18/2019   Excessive sweating 10/18/2019   Hypothyroidism 10/18/2019   Past Medical History:  Diagnosis Date   ADHD    Allergy    Anxiety and depression    Aortic aneurysm    Asthma    mild, but prior on preventative FLovent   Endometrial cancer (HCC) 2020   Genital herpes    GERD (gastroesophageal reflux disease)    Hyperlipidemia    Hypothyroidism    Osteoporosis    Thoracic aortic aneurysm    Wears hearing aid    Family History  Problem Relation Age of Onset   COPD Mother    Heart failure Mother    Macular degeneration Mother    Osteoporosis Mother    Cancer Mother        Mom needed a hysterectomy, thinks that she may have had uterine cancer   Heart disease Father        MI   COPD Father    Aortic aneurysm Father    Heart  disease Brother        MI   Myelodysplastic syndrome Brother    Osteoporosis Maternal Grandmother    Heart failure Maternal Grandmother    Heart failure Maternal Grandfather    COPD Maternal Grandfather    Stroke Paternal Grandfather    Breast cancer Neg Hx    Colon cancer Neg Hx    Colon polyps Neg Hx    Esophageal cancer Neg Hx    Rectal cancer Neg Hx    Stomach cancer Neg Hx    Past Surgical History:  Procedure  Laterality Date   ABDOMINAL HYSTERECTOMY     COLONOSCOPY  2019   ESOPHAGOGASTRODUODENOSCOPY ENDOSCOPY     ROBOTIC ASSISTED TOTAL HYSTERECTOMY WITH BILATERAL SALPINGO OOPHERECTOMY     RA-TLH/BSO   TONSILLECTOMY AND ADENOIDECTOMY     WRIST FRACTURE SURGERY Right    Social History   Occupational History   Occupation: hydrographic surveyor - retired  Tobacco Use   Smoking status: Never   Smokeless tobacco: Never  Vaping Use   Vaping status: Never Used  Substance and Sexual Activity   Alcohol use: Yes    Alcohol/week: 2.0 - 3.0 standard drinks of alcohol    Types: 2 - 3 Glasses of wine per week    Comment: 2-4 times monthly   Drug use: Yes    Frequency: 2.0 times per week    Types: Marijuana    Comment: gummies   Sexual activity: Not Currently    Birth control/protection: Post-menopausal   "

## 2024-03-23 MED ORDER — AMPHETAMINE-DEXTROAMPHETAMINE 10 MG PO TABS: 10.0000 mg | ORAL_TABLET | Freq: Every day | ORAL | 0 refills | Status: AC

## 2024-04-04 ENCOUNTER — Other Ambulatory Visit: Payer: Self-pay

## 2024-04-04 ENCOUNTER — Encounter: Payer: Self-pay | Admitting: Physical Therapy

## 2024-04-04 ENCOUNTER — Ambulatory Visit: Admitting: Physical Therapy

## 2024-04-04 DIAGNOSIS — M6281 Muscle weakness (generalized): Secondary | ICD-10-CM | POA: Diagnosis not present

## 2024-04-04 DIAGNOSIS — M5459 Other low back pain: Secondary | ICD-10-CM

## 2024-04-04 DIAGNOSIS — R2689 Other abnormalities of gait and mobility: Secondary | ICD-10-CM

## 2024-04-04 NOTE — Therapy (Signed)
 " OUTPATIENT PHYSICAL THERAPY THORACOLUMBAR EVALUATION   Patient Name: Jamie Singh MRN: [id number] DOB:[birth year], 74 y.o., female Today's Date: 04/04/2024  END OF SESSION:  PT End of Session - 04/04/24 0928     Visit Number 1    Number of Visits 8    Date for Recertification  05/30/24    Authorization Type Medicare    PT Start Time 0930    PT Stop Time 1010    PT Time Calculation (min) 40 min    Activity Tolerance Patient tolerated treatment well          Past Medical History:  Diagnosis Date   ADHD    Allergy    Anxiety and depression    Aortic aneurysm    Asthma    mild, but prior on preventative FLovent   Endometrial cancer (HCC) 2020   Genital herpes    GERD (gastroesophageal reflux disease)    Hyperlipidemia    Hypothyroidism    Osteoporosis    Thoracic aortic aneurysm    Wears hearing aid    Past Surgical History:  Procedure Laterality Date   ABDOMINAL HYSTERECTOMY     COLONOSCOPY  2019   ESOPHAGOGASTRODUODENOSCOPY ENDOSCOPY     ROBOTIC ASSISTED TOTAL HYSTERECTOMY WITH BILATERAL SALPINGO OOPHERECTOMY     RA-TLH/BSO   TONSILLECTOMY AND ADENOIDECTOMY     WRIST FRACTURE SURGERY Right    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   Pure hypercholesterolemia 08/20/2020   Vitamin D  deficiency, unspecified  08/20/2020   Urinary frequency 08/20/2020   Aortic valve regurgitation 05/19/2020   Gastroesophageal reflux disease 05/19/2020   Encounter for health maintenance examination in adult 02/20/2020   Uses hearing aid 02/20/2020   Vaccine counseling 02/20/2020   Post-menopausal 02/20/2020   Estrogen  deficiency 02/20/2020   Snoring 02/20/2020   History of eating disorder 02/20/2020   History of endometrial cancer 10/18/2019   Thoracic aortic aneurysm without rupture 10/18/2019   S/P hysterectomy 10/18/2019   Excessive sweating 10/18/2019   Hypothyroidism 10/18/2019    PCP: Bulah Alm RAMAN, PA-C  REFERRING PROVIDER: Trudy Duwaine BRAVO, NP  REFERRING DIAG:  [phone] (ICD-10-CM) - Chronic bilateral low back pain without sciatica  S39.012A (ICD-10-CM) - Strain of lumbar region, initial encounter    Rationale for Evaluation and Treatment: Rehabilitation  THERAPY DIAG:  Other low back pain  Muscle weakness (generalized)  Other abnormalities of gait and mobility  ONSET DATE: ~1 month ago  SUBJECTIVE:  SUBJECTIVE STATEMENT: Started a month ago. Pt moved homes -- carried heavy boxes and moving furniture. Not sure of any particular incidence but all of a sudden got pain in the small of her back. Could walk but couldn't work out with her trainer or do her aquatics classes. Finally saw massage therapist and given stretches to do which have helped. But she can feel increased pain with bending and walking her dog/daily activities. 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   PERTINENT HISTORY:  Osteoporosis (plans to get shots)  PAIN:  Are you having pain? Yes: NPRS scale: 0/10 currently and at rest. At worst 6/10 Pain location: Low back Pain description: Twinge/spasm, throbbing Aggravating factors: Lifting, standing in kitchen Relieving factors: Rest, ibuprofen, lidocaine patches  PRECAUTIONS: None  RED FLAGS: None   WEIGHT BEARING RESTRICTIONS: No  FALLS:  Has patient fallen in last 6 months? No  LIVING ENVIRONMENT: Lives with:  lives with their spouse Lives in: House/apartment Stairs: No Has following equipment at home: None  OCCUPATION: Retired -- volunteers, aquatic classes, goes to the gym  PLOF: Independent  PATIENT GOALS: Return to all normal activities  NEXT MD VISIT: as needed  OBJECTIVE:  Note: Objective measures were completed at Evaluation unless otherwise noted.  DIAGNOSTIC FINDINGS:  Nothing recent for lumbar spine  PATIENT SURVEYS:  PSFS: THE PATIENT SPECIFIC FUNCTIONAL SCALE  Place score of 0-10 (0 = unable to perform activity and 10 = able to perform activity at the same level as before injury or problem)  Activity Date: 04/04/24    Lifting over 10 4    2. Walking over 1/2 mile 3    3. Bending 6    4. Carrying groceries 5    5. Cleaning/vacuum 4    Average Score 22/5 = 4.4      Total Score = Sum of activity scores/number of activities  Minimally Detectable Change: 3 points (for single activity); 2 points (for average score)  [facility] (nd). The Patient Specific Functional Scale . Retrieved from [email or web address]   COGNITION: Overall cognitive status: Within functional limits for tasks assessed     SENSATION: WFL  MUSCLE LENGTH: Hamstrings: Right >80 deg; Left >80 deg  POSTURE: No Significant postural limitations  PALPATION: Only TTP along bilat lumbar paraspinals  LUMBAR ROM: performed slowly and cautiously  AROM eval  Flexion 90%   Extension 100%  Right lateral flexion 90%  Left lateral flexion 90% can feel it more  Right rotation 100%  Left rotation 100%   (Blank rows = not tested)  LOWER EXTREMITY ROM:   WNL     LOWER EXTREMITY MMT:    MMT Right eval Left eval  Hip flexion 3+ 3+  Hip extension 3+ 3+  Hip abduction 3+ 3  Hip adduction    Hip internal rotation    Hip external rotation    Knee flexion 5 5  Knee extension 5 5  Ankle dorsiflexion    Ankle plantarflexion    Ankle  inversion    Ankle eversion     (Blank rows = not tested)  LUMBAR SPECIAL TESTS:  Straight leg raise test: Negative and FABER test: Negative  FUNCTIONAL TESTS:  Did not assess  GAIT: Distance walked: Into clinic Assistive device utilized: None Level of assistance: Complete Independence Comments: Normal reciprocal pattern  TREATMENT DATE: 04/04/24 See HEP below  PATIENT EDUCATION:  Education details: Exam findings, POC, initial HEP Person educated: Patient Education method: Explanation, Demonstration, and Handouts Education comprehension: verbalized understanding, returned demonstration, and needs further education  HOME EXERCISE PROGRAM: Access Code: [email or web address] URL: [email or web address] Date: 04/04/2024 Prepared by: Ching Rabideau April Earnie Starring  Exercises - Prone Hip Extension  - 1 x daily - 7 x weekly - 2 sets - 10 reps - 3 sec hold - Sidelying Hip Abduction  - 1 x daily - 7 x weekly - 2 sets - 10 reps - 3 sec hold - Child's Pose Stretch  - 1 x daily - 7 x weekly - 2 sets - 30 sec hold - Child's Pose with Sidebending  - 1 x daily - 7 x weekly - 2 sets - 30 sec hold - Cat Cow  - 1 x daily - 7 x weekly - 2 sets - 10 reps  ASSESSMENT:  CLINICAL IMPRESSION: Patient is a 74 y.o. F who was seen today for physical therapy evaluation and treatment for low back pain which started ~1 month ago after pt was moving homes. PMH significant for osteoporosis. 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 Assessment is significant for weak bilat hips/core and lumbar paraspinal tenderness. S/S indicate likely lumbar paraspinal strain due to decreased glute activity/strength with her lifting/carrying tasks during her move. Pt will benefit from PT to improve her overall body mechanics and  increase strength to reduce lumbar load for current and future tasks.  OBJECTIVE IMPAIRMENTS: decreased activity tolerance, decreased mobility, difficulty walking, decreased strength, increased fascial restrictions, increased muscle spasms, improper body mechanics, postural dysfunction, and pain.   ACTIVITY LIMITATIONS: carrying, lifting, bending, standing, and locomotion level  PARTICIPATION LIMITATIONS: community activity, yard work, and recreational activity  PERSONAL FACTORS: Age, Fitness, Past/current experiences, and Time since onset of injury/illness/exacerbation are also affecting patient's functional outcome.   REHAB POTENTIAL: Good  CLINICAL DECISION MAKING: Evolving/moderate complexity  EVALUATION COMPLEXITY: Moderate   GOALS: Goals reviewed with patient? Yes  SHORT TERM GOALS: Target date: 05/02/2024   Pt will be ind with initial HEP Baseline: Goal status: INITIAL  2.  Pt will report no pain with all lumbar ROM Baseline:  Goal status: INITIAL   LONG TERM GOALS: Target date: 05/30/2024   Pt will be ind with management and progression of HEP Baseline:  Goal status: INITIAL  2.  Pt will be able to perform all lifting and carrying tasks with at least 40 lbs using safe body mechanics protecting lumbar spine for her community tasks Baseline:  Goal status: INITIAL  3.  Pt will report improved PSFS average score to >/=6.4 Baseline:  Goal status: INITIAL  4.  Pt will demo at least 4/5 bilat hip strength for improved glute activation with lifting tasks Baseline:  Goal status: INITIAL  5.  Pt will report improved overall pain by >/=75% Baseline:  Goal status: INITIAL    PLAN:  PT FREQUENCY: 1-2x/week  PT DURATION: 8 weeks  PLANNED INTERVENTIONS: [id number]- PT Re-evaluation, 97750- Physical Performance Testing, 97110-Therapeutic exercises, 97530- Therapeutic activity, 97112- Neuromuscular re-education, 97535- Self Care, [id number]- Manual therapy, [phone]- Aquatic  Therapy, G0283- Electrical stimulation (unattended), [id number]- Ionotophoresis 4mg /ml Dexamethasone, 79439 (1-2 muscles), 20561 (3+ muscles)- Dry Needling, Patient/Family education, Balance training, Joint mobilization, Spinal mobilization, Cryotherapy, and Moist heat.  PLAN FOR NEXT SESSION: Assess response to HEP. Work on heritage manager. Continue hip and core strengthening.    Augusto Deckman April  Honor LITTIE Starring, PT, DPT 04/04/2024, 12:19 PM  "

## 2024-04-10 ENCOUNTER — Ambulatory Visit: Admitting: Medical

## 2024-04-11 ENCOUNTER — Encounter

## 2024-04-19 ENCOUNTER — Encounter

## 2024-05-02 ENCOUNTER — Encounter

## 2024-05-09 ENCOUNTER — Encounter

## 2024-05-16 ENCOUNTER — Encounter

## 2024-08-28 ENCOUNTER — Ambulatory Visit: Payer: Self-pay

## 2024-11-20 ENCOUNTER — Encounter: Payer: Self-pay | Admitting: Medical
